# Patient Record
Sex: Female | Born: 1950 | Race: White | Hispanic: No | Smoking: Former smoker
Health system: Southern US, Community
[De-identification: ages and names within clinical notes are randomized; demographics above are authoritative.]

## PROBLEM LIST (undated history)

## (undated) DIAGNOSIS — N281 Cyst of kidney, acquired: Secondary | ICD-10-CM

## (undated) DIAGNOSIS — M1990 Unspecified osteoarthritis, unspecified site: Secondary | ICD-10-CM

## (undated) DIAGNOSIS — F419 Anxiety disorder, unspecified: Secondary | ICD-10-CM

## (undated) DIAGNOSIS — R001 Bradycardia, unspecified: Secondary | ICD-10-CM

## (undated) DIAGNOSIS — N390 Urinary tract infection, site not specified: Secondary | ICD-10-CM

## (undated) DIAGNOSIS — F329 Major depressive disorder, single episode, unspecified: Secondary | ICD-10-CM

## (undated) DIAGNOSIS — F32A Depression, unspecified: Secondary | ICD-10-CM

## (undated) DIAGNOSIS — C4490 Unspecified malignant neoplasm of skin, unspecified: Secondary | ICD-10-CM

## (undated) DIAGNOSIS — I10 Essential (primary) hypertension: Secondary | ICD-10-CM

## (undated) DIAGNOSIS — I440 Atrioventricular block, first degree: Secondary | ICD-10-CM

## (undated) DIAGNOSIS — J45909 Unspecified asthma, uncomplicated: Secondary | ICD-10-CM

## (undated) DIAGNOSIS — I7090 Unspecified atherosclerosis: Secondary | ICD-10-CM

## (undated) DIAGNOSIS — K760 Fatty (change of) liver, not elsewhere classified: Secondary | ICD-10-CM

## (undated) DIAGNOSIS — C801 Malignant (primary) neoplasm, unspecified: Secondary | ICD-10-CM

## (undated) DIAGNOSIS — N1330 Unspecified hydronephrosis: Secondary | ICD-10-CM

## (undated) DIAGNOSIS — E785 Hyperlipidemia, unspecified: Secondary | ICD-10-CM

## (undated) DIAGNOSIS — I480 Paroxysmal atrial fibrillation: Secondary | ICD-10-CM

## (undated) DIAGNOSIS — M549 Dorsalgia, unspecified: Secondary | ICD-10-CM

## (undated) DIAGNOSIS — K5790 Diverticulosis of intestine, part unspecified, without perforation or abscess without bleeding: Secondary | ICD-10-CM

## (undated) DIAGNOSIS — Z87442 Personal history of urinary calculi: Secondary | ICD-10-CM

## (undated) DIAGNOSIS — I701 Atherosclerosis of renal artery: Secondary | ICD-10-CM

## (undated) DIAGNOSIS — I4891 Unspecified atrial fibrillation: Secondary | ICD-10-CM

## (undated) DIAGNOSIS — R0600 Dyspnea, unspecified: Secondary | ICD-10-CM

## (undated) HISTORY — PX: TONSILLECTOMY: SUR1361

## (undated) HISTORY — DX: Atrioventricular block, first degree: I44.0

## (undated) HISTORY — DX: Unspecified atrial fibrillation: I48.91

## (undated) HISTORY — PX: OTHER SURGICAL HISTORY: SHX169

## (undated) HISTORY — PX: COLONOSCOPY: SHX174

## (undated) HISTORY — DX: Essential (primary) hypertension: I10

## (undated) HISTORY — DX: Dorsalgia, unspecified: M54.9

## (undated) HISTORY — DX: Hyperlipidemia, unspecified: E78.5

## (undated) HISTORY — PX: COLON SURGERY: SHX602

## (undated) HISTORY — DX: Atherosclerosis of renal artery: I70.1

## (undated) HISTORY — DX: Unspecified malignant neoplasm of skin, unspecified: C44.90

## (undated) HISTORY — DX: Paroxysmal atrial fibrillation: I48.0

## (undated) HISTORY — DX: Bradycardia, unspecified: R00.1

---

## 1998-03-28 ENCOUNTER — Other Ambulatory Visit: Admission: RE | Admit: 1998-03-28 | Discharge: 1998-03-28 | Payer: Self-pay | Admitting: Obstetrics and Gynecology

## 1999-03-30 ENCOUNTER — Emergency Department (HOSPITAL_COMMUNITY): Admission: EM | Admit: 1999-03-30 | Discharge: 1999-03-30 | Payer: Self-pay | Admitting: Emergency Medicine

## 1999-03-31 ENCOUNTER — Inpatient Hospital Stay (HOSPITAL_COMMUNITY): Admission: EM | Admit: 1999-03-31 | Discharge: 1999-04-10 | Payer: Self-pay | Admitting: Emergency Medicine

## 1999-03-31 ENCOUNTER — Encounter: Payer: Self-pay | Admitting: Emergency Medicine

## 1999-03-31 ENCOUNTER — Ambulatory Visit (HOSPITAL_COMMUNITY): Admission: RE | Admit: 1999-03-31 | Discharge: 1999-03-31 | Payer: Self-pay | Admitting: Emergency Medicine

## 1999-04-15 ENCOUNTER — Inpatient Hospital Stay (HOSPITAL_COMMUNITY): Admission: EM | Admit: 1999-04-15 | Discharge: 1999-04-19 | Payer: Self-pay | Admitting: Emergency Medicine

## 1999-04-23 ENCOUNTER — Inpatient Hospital Stay (HOSPITAL_COMMUNITY): Admission: EM | Admit: 1999-04-23 | Discharge: 1999-04-30 | Payer: Self-pay | Admitting: Emergency Medicine

## 1999-07-23 ENCOUNTER — Inpatient Hospital Stay (HOSPITAL_COMMUNITY): Admission: RE | Admit: 1999-07-23 | Discharge: 1999-07-30 | Payer: Self-pay | Admitting: General Surgery

## 1999-07-24 ENCOUNTER — Encounter: Payer: Self-pay | Admitting: General Surgery

## 2000-01-20 ENCOUNTER — Other Ambulatory Visit: Admission: RE | Admit: 2000-01-20 | Discharge: 2000-01-20 | Payer: Self-pay | Admitting: Obstetrics and Gynecology

## 2001-03-11 ENCOUNTER — Encounter: Admission: RE | Admit: 2001-03-11 | Discharge: 2001-04-14 | Payer: Self-pay | Admitting: Podiatry

## 2002-02-21 ENCOUNTER — Other Ambulatory Visit: Admission: RE | Admit: 2002-02-21 | Discharge: 2002-02-21 | Payer: Self-pay | Admitting: Obstetrics and Gynecology

## 2002-12-18 ENCOUNTER — Ambulatory Visit (HOSPITAL_COMMUNITY): Admission: RE | Admit: 2002-12-18 | Discharge: 2002-12-18 | Payer: Self-pay | Admitting: Gastroenterology

## 2003-08-07 ENCOUNTER — Other Ambulatory Visit: Admission: RE | Admit: 2003-08-07 | Discharge: 2003-08-07 | Payer: Self-pay | Admitting: Obstetrics and Gynecology

## 2009-12-30 HISTORY — PX: US ECHOCARDIOGRAPHY: HXRAD669

## 2011-01-06 ENCOUNTER — Ambulatory Visit: Payer: Self-pay | Admitting: Cardiovascular Disease

## 2011-04-29 ENCOUNTER — Other Ambulatory Visit: Payer: Self-pay | Admitting: Cardiovascular Disease

## 2011-04-29 NOTE — Telephone Encounter (Signed)
Fax received from pharmacy. Refill completed. Jodette Ida Uppal RN  

## 2011-06-01 ENCOUNTER — Other Ambulatory Visit: Payer: Self-pay | Admitting: Obstetrics & Gynecology

## 2011-06-23 ENCOUNTER — Encounter: Payer: Self-pay | Admitting: Cardiovascular Disease

## 2011-06-24 ENCOUNTER — Encounter: Payer: Self-pay | Admitting: Cardiovascular Disease

## 2011-06-29 ENCOUNTER — Ambulatory Visit (INDEPENDENT_AMBULATORY_CARE_PROVIDER_SITE_OTHER): Payer: PRIVATE HEALTH INSURANCE | Admitting: Cardiovascular Disease

## 2011-06-29 ENCOUNTER — Encounter: Payer: Self-pay | Admitting: Cardiovascular Disease

## 2011-06-29 VITALS — BP 120/74 | HR 68 | Ht 65.0 in | Wt 220.2 lb

## 2011-06-29 DIAGNOSIS — I4891 Unspecified atrial fibrillation: Secondary | ICD-10-CM

## 2011-06-29 DIAGNOSIS — I480 Paroxysmal atrial fibrillation: Secondary | ICD-10-CM | POA: Insufficient documentation

## 2011-06-29 MED ORDER — METOPROLOL SUCCINATE ER 100 MG PO TB24: 150.0000 mg | ORAL_TABLET | Freq: Every day | ORAL | Status: DC

## 2011-06-29 NOTE — Assessment & Plan Note (Addendum)
We had a long discussion about management of atrial fibrillation. We talked about very low risk medications that also had relatively little efficacy. We talked about very high risk medicines that were much more efficacious such as Tikosyn. We've discussed the fact that she may be a candidate for flecainide but she really does not want to have a stress test.  She has been taking the propranolol on an as-needed basis and notes that her a fibrillation is a lot better tolerated when her heart rate is slow. We will increase her Toprol-XL to 150 mg a day and see if that helps. I given her permission to take that when necessary propranolol as well.  I've asked her to try to stick to a good diet and exercise program. I did emphasize the fact that if she were to lose weight, that her age her fibrillation may improve and that she would probably tolerate much better.  Posterior back in several months.

## 2011-06-29 NOTE — Progress Notes (Signed)
Peggy Chen Date of Birth  Jan 10, 1951 [facility] [address]     [address] [place], [place]  [address] [phone]  Fax  [phone]  History of Present Illness:  Peggy Chen is a 60 year old female with a history of intermittent atrial fibrillation. She also has a history of diabetes mellitus, hyperlipidemia, and hypertension.  She has atrial fibrillation that occurs approximately 1 week. These episodes typically occur on Tuesday according to her. She does not drink any excessive amount of alcohol. She thinks that there may be a trigger for her atrial fibrillation but she cannot determine what that is. These episodes they took fibrillation causes lots of shortness of breath and general fatigue. She did tolerates them very poorly.  [name] going to [facility] for second opinion. They have mentioned starting her on flexion. She does not really want to go to the stress test.  Current Outpatient Prescriptions on File Prior to Visit  Medication Sig Dispense Refill  . aspirin 81 MG tablet Take 81 mg by mouth daily.        . folic acid (FOLVITE) 1 MG tablet Take 1 mg by mouth daily.        . metoprolol (TOPROL-XL) 100 MG 24 hr tablet TAKE 1 TABLET BY MOUTH EVERY DAY  30 tablet  12  . Olmesartan-Amlodipine-HCTZ (TRIBENZOR) 40-5-25 MG TABS Take by mouth daily.        Marland Kitchen omega-3 acid ethyl esters (LOVAZA) 1 G capsule Take 1 g by mouth 2 (two) times daily.        . propranolol (INDERAL) 10 MG tablet Take 10 mg by mouth as needed.        . warfarin (COUMADIN) 5 MG tablet Take 5 mg by mouth as directed.        . zolpidem (AMBIEN) 5 MG tablet Take 2.5 mg by mouth at bedtime.          Allergies  Allergen Reactions  . Penicillins     Past Medical History  Diagnosis Date  . Diabetes mellitus   . Hyperlipidemia   . Hypertension   . Back pain   . AF (atrial fibrillation)     Past Surgical History  Procedure Date  . Tonsillectomy   . Breast cyst  removal     RIGHT BREAST  . Colonoscopy   . US echocardiography 12/30/2009    EF 55-60%    History  Smoking status  . Former Smoker  . Quit date: 06/22/1986  Smokeless tobacco  . Not on file    History  Alcohol Use No    Family History  Problem Relation Age of Onset  . COPD Mother   . Heart attack Father     Reviw of Systems:  Reviewed in the HPI.  All other systems are negative.  Physical Exam: BP 120/74  Pulse 68  Ht 5\' 5"  (1.651 m)  Wt 220 lb 3.2 oz (99.882 kg)  BMI 36.64 kg/m2 The patient is alert and oriented x 3.  The mood and affect are normal.   Skin: warm and dry.  Color is normal.    HEENT:   the sclera are nonicteric.  The mucous membranes are moist.  The carotids are 2+ without bruits.  There is no thyromegaly.  There is no JVD.    Lungs: clear.  The chest wall is non tender.    Heart: regular rate with a normal S1 and S2.  There are no murmurs, gallops, or  rubs. The PMI is not displaced.     Abdomen: good bowel sounds.  There is no guarding or rebound.  There is no hepatosplenomegaly or tenderness.  There are no masses.   Extremities:  no clubbing, cyanosis, or edema.  The legs are without rashes.  The distal pulses are intact.   Neuro:  Cranial nerves II - XII are intact.  Motor and sensory functions are intact.    The gait is normal.  Assessment / Plan:

## 2011-08-13 ENCOUNTER — Other Ambulatory Visit: Payer: Self-pay | Admitting: *Deleted

## 2011-08-13 MED ORDER — PROPRANOLOL HCL 10 MG PO TABS: 10.0000 mg | ORAL_TABLET | ORAL | Status: DC | PRN

## 2011-08-13 NOTE — Telephone Encounter (Signed)
Fax received from pharmacy. Refill completed. Jodette Quatisha Zylka RN  

## 2011-09-28 ENCOUNTER — Ambulatory Visit (INDEPENDENT_AMBULATORY_CARE_PROVIDER_SITE_OTHER): Payer: PRIVATE HEALTH INSURANCE | Admitting: Cardiovascular Disease

## 2011-09-28 ENCOUNTER — Encounter: Payer: Self-pay | Admitting: Cardiovascular Disease

## 2011-09-28 DIAGNOSIS — I4891 Unspecified atrial fibrillation: Secondary | ICD-10-CM

## 2011-09-28 DIAGNOSIS — I152 Hypertension secondary to endocrine disorders: Secondary | ICD-10-CM | POA: Insufficient documentation

## 2011-09-28 DIAGNOSIS — I10 Essential (primary) hypertension: Secondary | ICD-10-CM

## 2011-09-28 MED ORDER — METOPROLOL SUCCINATE ER 200 MG PO TB24: 200.0000 mg | ORAL_TABLET | Freq: Every day | ORAL | Status: DC

## 2011-09-28 NOTE — Progress Notes (Signed)
Peggy Chen Date of Birth  1951-06-01  [facility] [address]    [address] [place], [place]  [address] [phone]  Fax  [phone]  History of Present Illness:  60 year old female with a history of intermittent atrial fibrillation. She also has a history of diabetes mellitus, hyperlipidemia, and hypertension.  [name] been seen at [facility] for second opinion. She is to follow up with [place].  She has a negative stress Myoview study from 2007 in our  office.  [employer] also did a stress test which apparently was also negative.    We talked about starting her on Flecainide or perhaps Propafanone. We have tried to avoid  amiodarone and Tikosyn at this point.  She remains on Coumadin.  Her INR levels are monitored by Dr. Felipa Eth.  She developed recurrent atrial fibrillation today. She notes that her heart rate goes up fairly quickly whenever she walks around. Her episodes typically last for a couple days. [name] on metoprolol and also has taken a couple of extra propranolol tablets. At this point the propranolol tablets don't seem to help decrease her weight much.  SHe typically takes a couple of nights rest and to get these to resolve.  Current Outpatient Prescriptions on File Prior to Visit  Medication Sig Dispense Refill  . aspirin 81 MG tablet Take 81 mg by mouth daily.        . folic acid (FOLVITE) 1 MG tablet Take 1 mg by mouth daily.        . metoprolol (TOPROL-XL) 100 MG 24 hr tablet Take 1.5 tablets (150 mg total) by mouth daily.  45 tablet  12  . Olmesartan-Amlodipine-HCTZ (TRIBENZOR) 40-5-25 MG TABS Take by mouth daily.        Marland Kitchen omega-3 acid ethyl esters (LOVAZA) 1 G capsule Take 1 g by mouth 2 (two) times daily.        . propranolol (INDERAL) 10 MG tablet Take 1 tablet (10 mg total) by mouth as needed.  30 tablet  1  . Saxagliptin-Metformin (KOMBIGLYZE XR) 2.04-999 MG TB24 Take by mouth 2 (two) times daily.        Marland Kitchen warfarin (COUMADIN) 5 MG tablet Take 5 mg by  mouth as directed.        . zolpidem (AMBIEN) 5 MG tablet Take 2.5 mg by mouth at bedtime.          Allergies  Allergen Reactions  . Penicillins     Past Medical History  Diagnosis Date  . Diabetes mellitus   . Hyperlipidemia   . Hypertension   . Back pain   . AF (atrial fibrillation)     Past Surgical History  Procedure Date  . Tonsillectomy   . Breast cyst removal     RIGHT BREAST  . Colonoscopy   . US echocardiography 12/30/2009    EF 55-60%    History  Smoking status  . Former Smoker  . Quit date: 06/22/1986  Smokeless tobacco  . Not on file    History  Alcohol Use No    Family History  Problem Relation Age of Onset  . COPD Mother   . Heart attack Father     Reviw of Systems:  Reviewed in the HPI.  All other systems are negative.  Physical Exam: BP 132/80  Pulse 80  Ht 5\' 5"  (1.651 m)  Wt 223 lb 12.8 oz (101.515 kg)  BMI 37.24 kg/m2 The patient is alert and oriented x 3.  The mood and affect  are normal.   Skin: warm and dry.  Color is normal.    HEENT:   Normal carotids, no JVD  Lungs: clear   Heart: Irregularly Irregular    Abdomen: benign  Extremities:  No edema.  Neuro:  Nonfocal. Her gait is normal.     ECG: Atrial fibrillation with a VAC rapid ventricular response. She has nonspecific ST and T-wave changes. She has low voltage QRS.   Assessment / Plan:

## 2011-09-28 NOTE — Assessment & Plan Note (Addendum)
Blood pressures remained stable. We'll continue to follow this.

## 2011-09-28 NOTE — Patient Instructions (Addendum)
Your physician wants you to follow-up in: 3 months/ ekg,  You will receive a reminder letter in the mail two months in advance. If you don't receive a letter, please call our office to schedule the follow-up appointment.  Your physician has recommended you make the following change in your medication:   1) increase Toprol xl to 200 mg a day, called into [facility].

## 2011-09-28 NOTE — Assessment & Plan Note (Addendum)
We talked extensively about the different treatments for atrial fibrillation. We discussed the possibility of the Propafanone and possibly Flecainide. She would like to continue with the beta blocker approach. We will increase her metoprolol to 200 mg a day. She'll continue to take propranolol on an as-needed basis.

## 2011-10-26 ENCOUNTER — Other Ambulatory Visit: Payer: Self-pay | Admitting: Cardiovascular Disease

## 2012-03-24 ENCOUNTER — Other Ambulatory Visit: Payer: Self-pay | Admitting: *Deleted

## 2012-03-24 DIAGNOSIS — I4891 Unspecified atrial fibrillation: Secondary | ICD-10-CM

## 2012-03-24 MED ORDER — METOPROLOL SUCCINATE ER 200 MG PO TB24: 200.0000 mg | ORAL_TABLET | Freq: Every day | ORAL | Status: DC

## 2012-03-30 ENCOUNTER — Other Ambulatory Visit: Payer: Self-pay | Admitting: *Deleted

## 2012-03-30 DIAGNOSIS — I4891 Unspecified atrial fibrillation: Secondary | ICD-10-CM

## 2012-03-30 MED ORDER — METOPROLOL SUCCINATE ER 200 MG PO TB24: 200.0000 mg | ORAL_TABLET | Freq: Every day | ORAL | Status: DC

## 2012-03-30 NOTE — Telephone Encounter (Signed)
Fax Received. Refill Completed. Peggy Chen (R.M.A)   

## 2012-03-30 NOTE — Telephone Encounter (Signed)
Opened in Error.

## 2012-04-04 ENCOUNTER — Other Ambulatory Visit: Payer: Self-pay | Admitting: *Deleted

## 2012-04-04 NOTE — Telephone Encounter (Signed)
Opened in Error.

## 2012-09-21 ENCOUNTER — Other Ambulatory Visit: Payer: Self-pay | Admitting: *Deleted

## 2012-09-21 DIAGNOSIS — I4891 Unspecified atrial fibrillation: Secondary | ICD-10-CM

## 2012-09-21 MED ORDER — METOPROLOL SUCCINATE ER 200 MG PO TB24: 200.0000 mg | ORAL_TABLET | Freq: Every day | ORAL | Status: DC

## 2012-09-21 NOTE — Telephone Encounter (Signed)
Pt needs appointment then refill can be made Fax Received. Refill Completed. Peggy Chen (R.M.A)   

## 2012-09-22 ENCOUNTER — Other Ambulatory Visit: Payer: Self-pay | Admitting: *Deleted

## 2012-09-22 NOTE — Telephone Encounter (Signed)
Opened in Error.

## 2012-09-23 ENCOUNTER — Telehealth: Payer: Self-pay | Admitting: Cardiovascular Disease

## 2012-09-27 ENCOUNTER — Other Ambulatory Visit: Payer: Self-pay | Admitting: *Deleted

## 2012-09-27 DIAGNOSIS — I4891 Unspecified atrial fibrillation: Secondary | ICD-10-CM

## 2012-09-27 MED ORDER — METOPROLOL SUCCINATE ER 200 MG PO TB24: 200.0000 mg | ORAL_TABLET | Freq: Every day | ORAL | Status: DC

## 2012-10-10 ENCOUNTER — Other Ambulatory Visit: Payer: Self-pay | Admitting: Cardiovascular Disease

## 2012-10-10 NOTE — Telephone Encounter (Signed)
Pt needs appointment then refill can be made Fax Received. Refill Completed. Gee Habig Chowoe (R.M.A)   

## 2012-10-17 ENCOUNTER — Ambulatory Visit: Payer: PRIVATE HEALTH INSURANCE | Admitting: Cardiovascular Disease

## 2012-10-21 ENCOUNTER — Encounter: Payer: Self-pay | Admitting: Cardiovascular Disease

## 2012-10-24 ENCOUNTER — Encounter: Payer: Self-pay | Admitting: Cardiology

## 2012-11-14 ENCOUNTER — Encounter: Payer: Self-pay | Admitting: Cardiovascular Disease

## 2012-11-14 ENCOUNTER — Ambulatory Visit (INDEPENDENT_AMBULATORY_CARE_PROVIDER_SITE_OTHER): Payer: BC Managed Care – PPO | Admitting: Cardiovascular Disease

## 2012-11-14 VITALS — BP 110/70 | HR 68 | Ht 65.0 in | Wt 224.0 lb

## 2012-11-14 DIAGNOSIS — I4891 Unspecified atrial fibrillation: Secondary | ICD-10-CM

## 2012-11-14 DIAGNOSIS — I10 Essential (primary) hypertension: Secondary | ICD-10-CM

## 2012-11-14 NOTE — Patient Instructions (Addendum)
Your physician wants you to follow-up in: 1 year  You will receive a reminder letter in the mail two months in advance. If you don't receive a letter, please call our office to schedule the follow-up appointment.  Your physician recommends that you continue on your current medications as directed. Please refer to the Current Medication list given to you today.  

## 2012-11-14 NOTE — Progress Notes (Signed)
Peggy Chen Date of Birth  [birth year]       [facility] Office [address]  [address] [place], [place]  [address]   [place], [place]  [address] [phone]     [phone]   Fax  [phone]    Fax [phone]  Problem List: 1. Intermittent atrial fibrillation 2. Diabetes mellitus 3. Hyperlipidemia 4. Hypertension  History of Present Illness:  61 year old female with a history of intermittent atrial fibrillation. She also has a history of diabetes mellitus, hyperlipidemia, and hypertension. She still occasionally has episodes of A-Fib that last 1-3 days.  They can be as short as 3 hours.  She takes propranolol which seems to help.  She is on coumadin.   She does not exercise much at all.  She actually feels better when she is active.    Current Outpatient Prescriptions on File Prior to Visit  Medication Sig Dispense Refill  . aspirin 81 MG tablet Take 81 mg by mouth daily.        . folic acid (FOLVITE) 1 MG tablet Take 1 mg by mouth daily.        . metoprolol (TOPROL-XL) 200 MG 24 hr tablet Take 1 tablet (200 mg total) by mouth daily.  15 tablet  0  . Olmesartan-Amlodipine-HCTZ (TRIBENZOR) 40-5-25 MG TABS Take by mouth daily.        Marland Kitchen omega-3 acid ethyl esters (LOVAZA) 1 G capsule Take 1 g by mouth 2 (two) times daily.        . propranolol (INDERAL) 10 MG tablet TAKE 1 TABLET BY MOUTH EVERY DAY AS NEEDED  30 tablet  0  . Saxagliptin-Metformin (KOMBIGLYZE XR) 2.04-999 MG TB24 Take by mouth 2 (two) times daily.        Marland Kitchen warfarin (COUMADIN) 5 MG tablet Take 5 mg by mouth as directed.        . zolpidem (AMBIEN) 5 MG tablet Take 2.5 mg by mouth at bedtime.          Allergies  Allergen Reactions  . Penicillins     Past Medical History  Diagnosis Date  . Diabetes mellitus   . Hyperlipidemia   . Hypertension   . Back pain   . AF (atrial fibrillation)     Past Surgical History  Procedure Date  . Tonsillectomy     . Breast cyst removal     RIGHT BREAST  . Colonoscopy   . US echocardiography 12/30/2009    EF 55-60%    History  Smoking status  . Former Smoker  . Quit date: 06/22/1986  Smokeless tobacco  . Not on file    History  Alcohol Use No    Family History  Problem Relation Age of Onset  . COPD Mother   . Heart attack Father     Reviw of Systems:  Reviewed in the HPI.  All other systems are negative.  Physical Exam: Blood pressure 110/70, pulse 68, height 5\' 5"  (1.651 m), weight 224 lb (101.606 kg). General: Well developed, well nourished, in no acute distress.  Head: Normocephalic, atraumatic, sclera non-icteric, mucus membranes are moist,   Neck: Supple. Carotids are 2 + without bruits. No JVD , thick neck  Lungs: Clear   Heart: RR  Abdomen: Soft, non-tender, non-distended with normal bowel sounds.  Msk:  Strength and tone are normal   Extremities: No clubbing or cyanosis. No edema.  Distal pedal pulses are 2+ and  equal    Neuro: CN II - XII intact.  Alert and oriented X 3.   Psych:  Normal   ECG: 11/14/2012-normal sinus rhythm at 60. Is low voltage.  Assessment / Plan:

## 2012-11-14 NOTE — Assessment & Plan Note (Addendum)
Peggy Chen seems to be doing well. She continues to have intermittent episodes of atrial fibrillation. She takes propranolol on an as-needed basis. We'll continue with her current medications . I'll see her again in one year. She did not want to try any new antiarrhythmics at this time.

## 2012-12-14 ENCOUNTER — Other Ambulatory Visit: Payer: Self-pay | Admitting: *Deleted

## 2012-12-14 MED ORDER — PROPRANOLOL HCL 10 MG PO TABS: 10.0000 mg | ORAL_TABLET | Freq: Every day | ORAL | Status: DC | PRN

## 2012-12-14 NOTE — Telephone Encounter (Signed)
Fax Received. Refill Completed. Peggy Chen (R.M.A)   

## 2013-05-25 ENCOUNTER — Other Ambulatory Visit: Payer: Self-pay | Admitting: *Deleted

## 2013-05-25 DIAGNOSIS — I4891 Unspecified atrial fibrillation: Secondary | ICD-10-CM

## 2013-05-25 MED ORDER — METOPROLOL SUCCINATE ER 200 MG PO TB24: 200.0000 mg | ORAL_TABLET | Freq: Every day | ORAL | Status: DC

## 2013-05-25 NOTE — Telephone Encounter (Signed)
Fax Received. Refill Completed. Quetzalli Clos Chowoe (R.M.A)   

## 2013-12-13 ENCOUNTER — Other Ambulatory Visit: Payer: Self-pay | Admitting: Cardiovascular Disease

## 2014-02-12 ENCOUNTER — Other Ambulatory Visit: Payer: Self-pay | Admitting: Cardiovascular Disease

## 2014-02-22 ENCOUNTER — Ambulatory Visit: Payer: BC Managed Care – PPO | Admitting: Cardiovascular Disease

## 2014-02-26 ENCOUNTER — Encounter: Payer: Self-pay | Admitting: Cardiovascular Disease

## 2014-02-26 ENCOUNTER — Ambulatory Visit (INDEPENDENT_AMBULATORY_CARE_PROVIDER_SITE_OTHER): Payer: PRIVATE HEALTH INSURANCE | Admitting: Cardiovascular Disease

## 2014-02-26 VITALS — BP 94/70 | HR 97 | Ht 65.0 in | Wt 226.1 lb

## 2014-02-26 DIAGNOSIS — I4891 Unspecified atrial fibrillation: Secondary | ICD-10-CM

## 2014-02-26 MED ORDER — FLECAINIDE ACETATE 50 MG PO TABS: 50.0000 mg | ORAL_TABLET | Freq: Two times a day (BID) | ORAL | Status: DC

## 2014-02-26 NOTE — Assessment & Plan Note (Signed)
[name] gone back into atrial fibrillation and now spends a fair amount of time in atrial fibrillation. We'll start her on flecainide 50 mg twice a day. I though I will have her return to see Cecille Rubin  in approximately 2 weeks to see if she's converted. If she is still in atrial fibrillation at that time, we will increase her flecainide to 100 mg twice a day.  Once we've seen that she's converted and maintained sinus rhythm, we'll schedule her for a stress Myoview study to rule out ischemia and to evaluate her for possible pro-arrhythmia. I would like the stress test performed on  flecainide and metoprolol. If she is not able to give her heart rate up to target, we can use Lexiscan.    I'll see her in 3 months.

## 2014-02-26 NOTE — Progress Notes (Signed)
Peggy Chen Date of Birth  10/18/1951       [facility] Office [address] [place], [place], [place]  [address]   [place], [place]  [address] [phone]     [phone]   Fax  [phone]    Fax [phone]  Problem List: 1. Intermittent atrial fibrillation 2. Diabetes mellitus 3. Hyperlipidemia 4. Hypertension  History of Present Illness:  63 year old female with a history of intermittent atrial fibrillation. She also has a history of diabetes mellitus, hyperlipidemia, and hypertension. She still occasionally has episodes of A-Fib that last 1-3 days.  They can be as short as 3 hours.  She takes propranolol which seems to help.  She is on coumadin.   She does not exercise much at all.  She actually feels better when she is active.    February 26, 2014:  Still having some atrial fib.  On coumadin.    Current Outpatient Prescriptions on File Prior to Visit  Medication Sig Dispense Refill  . aspirin 81 MG tablet Take 81 mg by mouth daily.        . folic acid (FOLVITE) 1 MG tablet Take 1 mg by mouth daily.        . metoprolol (TOPROL-XL) 200 MG 24 hr tablet TAKE 1 TABLET BY MOUTH EVERY DAY  90 tablet  0  . Olmesartan-Amlodipine-HCTZ (TRIBENZOR) 40-5-25 MG TABS Take by mouth daily.        . propranolol (INDERAL) 10 MG tablet TAKE 1 TABLET (10 MG TOTAL) BY MOUTH DAILY AS NEEDED.  30 tablet  0  . Saxagliptin-Metformin (KOMBIGLYZE XR) 2.04-999 MG TB24 Take by mouth 2 (two) times daily.        Marland Kitchen warfarin (COUMADIN) 5 MG tablet Take 5 mg by mouth as directed.        . zolpidem (AMBIEN) 5 MG tablet Take 2.5 mg by mouth at bedtime.         No current facility-administered medications on file prior to visit.    Allergies  Allergen Reactions  . Penicillins     Past Medical History  Diagnosis Date  . Diabetes mellitus   . Hyperlipidemia   . Hypertension   . Back pain   . AF (atrial fibrillation)     Past Surgical  History  Procedure Laterality Date  . Tonsillectomy    . Breast cyst removal      RIGHT BREAST  . Colonoscopy    . US echocardiography  12/30/2009    EF 55-60%    History  Smoking status  . Former Smoker  . Quit date: 06/22/1986  Smokeless tobacco  . Not on file    History  Alcohol Use No    Family History  Problem Relation Age of Onset  . COPD Mother   . Heart attack Father     Reviw of Systems:  Reviewed in the HPI.  All other systems are negative.  Physical Exam: Blood pressure 94/70, pulse 97, height 5\' 5"  (1.651 m), weight 226 lb 1.9 oz (102.567 kg). General: Well developed, well nourished, in no acute distress.  Head: Normocephalic, atraumatic, sclera non-icteric, mucus membranes are moist,   Neck: Supple. Carotids are 2 + without bruits. No JVD , thick neck  Lungs: Clear   Heart: Irregularly irregular, her heart rate is tachycardic.  Abdomen: Soft, non-tender, non-distended with normal bowel sounds.  Msk:  Strength and tone are normal   Extremities:  No clubbing or cyanosis. No edema.  Distal pedal pulses are 2+ and equal    Neuro: CN II - XII intact.  Alert and oriented X 3.   Psych:  Normal   ECG: 02/26/2014: Atrial fibrillation with a ventricular rate of 97. She has nonspecific ST and T wave. .  Assessment / Plan:

## 2014-02-26 NOTE — Patient Instructions (Addendum)
Your physician has recommended you make the following change in your medication:  START FLECAINIDE 50 MG TWICE DAILY 12 HOURS APART  Your physician recommends that you schedule a follow-up appointment in: [place] REVIEW    Your physician recommends that you schedule a follow-up appointment in: 3 MONTHS WITH AN EKG

## 2014-02-27 ENCOUNTER — Telehealth: Payer: Self-pay | Admitting: Cardiovascular Disease

## 2014-02-27 NOTE — Telephone Encounter (Signed)
NOTED

## 2014-02-27 NOTE — Telephone Encounter (Signed)
New message     Metoprolol dosage is 200mg .  Pt want Peggy Chen to know.

## 2014-03-13 ENCOUNTER — Ambulatory Visit: Payer: PRIVATE HEALTH INSURANCE | Admitting: Nurse Practitioner

## 2014-03-26 ENCOUNTER — Ambulatory Visit (INDEPENDENT_AMBULATORY_CARE_PROVIDER_SITE_OTHER): Payer: BC Managed Care – PPO | Admitting: Nurse Practitioner

## 2014-03-26 ENCOUNTER — Encounter: Payer: Self-pay | Admitting: Nurse Practitioner

## 2014-03-26 VITALS — BP 110/70 | HR 89 | Ht 65.0 in | Wt 226.8 lb

## 2014-03-26 DIAGNOSIS — I4891 Unspecified atrial fibrillation: Secondary | ICD-10-CM

## 2014-03-26 MED ORDER — FLECAINIDE ACETATE 50 MG PO TABS: 100.0000 mg | ORAL_TABLET | Freq: Two times a day (BID) | ORAL | Status: DC

## 2014-03-26 NOTE — Progress Notes (Addendum)
Peggy Chen Date of Birth: 10-May-1951 Medical Record #[id number]  History of Present Illness: Peggy Chen is seen back today for a follow up visit. Seen for Dr. Acie Fredrickson. She is a 63 year old female with PAF - now on flecainide. Remains on coumadin - followed by Dr. Dagmar Hait. Other issues include HTN, HLD and DM. No known CAD.  Seen last month by Dr. Acie Fredrickson - she was in atrial fib - he started her on Flecainide - plan was to come back and titrate up if needed - then consider cardioversion - proceed with stress Myoview if she ends up staying on this dose.   Comes back today. Here alone. Still out of rhythm. She is easily fatigued. No chest pain. No passing out spells. Has been on coumadin long term - says her dose has been stable for some time. Last check last week was 2.9. No bleeding/bruising.  Current Outpatient Prescriptions  Medication Sig Dispense Refill  . aspirin 81 MG tablet Take 81 mg by mouth daily.        . citalopram (CELEXA) 10 MG tablet Take 10 mg by mouth daily.      . cyanocobalamin 1000 MCG tablet Take 100 mcg by mouth daily.      . flecainide (TAMBOCOR) 50 MG tablet Take 1 tablet (50 mg total) by mouth 2 (two) times daily.  836 tablet  3  . folic acid (FOLVITE) 1 MG tablet Take 1 mg by mouth daily.        . metoprolol (TOPROL-XL) 200 MG 24 hr tablet TAKE 1 TABLET BY MOUTH EVERY DAY  90 tablet  0  . Olmesartan-Amlodipine-HCTZ (TRIBENZOR) 40-5-25 MG TABS Take by mouth daily.        . propranolol (INDERAL) 10 MG tablet TAKE 1 TABLET (10 MG TOTAL) BY MOUTH DAILY AS NEEDED.  30 tablet  0  . Saxagliptin-Metformin (KOMBIGLYZE XR) 2.04-999 MG TB24 Take by mouth 2 (two) times daily.        Marland Kitchen warfarin (COUMADIN) 5 MG tablet Take 5 mg by mouth as directed.        . zolpidem (AMBIEN) 5 MG tablet Take 2.5 mg by mouth at bedtime.         No current facility-administered medications for this visit.    Allergies  Allergen Reactions  . Penicillins     Past Medical History  Diagnosis  Date  . Diabetes mellitus   . Hyperlipidemia   . Hypertension   . Back pain   . AF (atrial fibrillation)     Past Surgical History  Procedure Laterality Date  . Tonsillectomy    . Breast cyst removal      RIGHT BREAST  . Colonoscopy    . US echocardiography  12/30/2009    EF 55-60%    History  Smoking status  . Former Smoker  . Quit date: 06/22/1986  Smokeless tobacco  . Not on file    History  Alcohol Use No    Family History  Problem Relation Age of Onset  . COPD Mother   . Heart attack Father     Review of Systems: The review of systems is per the HPI.  All other systems were reviewed and are negative.  Physical Exam: BP 110/70  Pulse 89  Ht 5\' 5"  (1.651 m)  Wt 226 lb 12.8 oz (102.876 kg)  BMI 37.74 kg/m2 Patient is very pleasant and in no acute distress. She is obese. Skin is warm and dry. Color is normal.  HEENT is unremarkable. Normocephalic/atraumatic. PERRL. Sclera are nonicteric. Neck is supple. No masses. No JVD. Lungs are clear. Cardiac exam shows an irregular rhythm. Her rate is controlled. Abdomen is soft. Extremities are without edema. Gait and ROM are intact. No gross neurologic deficits noted.   LABORATORY DATA: EKG today is reviewed with Dr. Acie Fredrickson - she is in atrial fib - controlled VR - diffuse ST/T wave changes. Normal QT.   No results found for this basename: WBC, HGB, HCT, PLT, GLUCOSE, CHOL, TRIG, HDL, LDLDIRECT, LDLCALC, ALT, AST, NA, K, CL, CREATININE, BUN, CO2, TSH, PSA, INR, GLUF, HGBA1C, MICROALBUR   No results found for this basename: INR, PROTIME     Assessment / Plan: 1. Atrial fib - rate is controlled - discussed with Dr. Acie Fredrickson - will increase the Flecainide to 100 mg BID. Noted the possible interaction with her Celexa - reviewed with Dr. Acie Fredrickson who feels it is safe to proceed. She has a normal QT and QRS. Recheck EKG on return. If she is in atrial fib on return - will plan for cardioversion - then arrange for stress Myoview if  she remains on long term Flecainide. Update her echo today as well.   2.HTN  3. HLD  4. Chronic coumadin - has been on a stable dose for some time - will call PCP to review past INR's.   Patient is agreeable to this plan and will call if any problems develop in the interim.   Burtis Junes, RN, [facility] [address] [place], [place]  [address] [phone]   Addendum:  [name] received and are as follows:  03/21/14 INR was 2.9 02/07/2014 INR was 2.7 12/26/2013 was 2.6 11/21/2013 was 2.7  Burtis Junes, RN, [facility] [address] [place], [place]  [address] [phone]

## 2014-03-26 NOTE — Patient Instructions (Signed)
We will arrange for an echocardiogram  I will see you in 2 weeks  Increase your Flecainide to 100 mg two times a day  We will call Dr. Dagmar Hait about getting your past protimes.  Call the [place] office at [phone] if you have any questions, problems or concerns.

## 2014-04-05 ENCOUNTER — Ambulatory Visit (HOSPITAL_COMMUNITY): Payer: BC Managed Care – PPO | Attending: Internal Medicine | Admitting: Radiology

## 2014-04-05 DIAGNOSIS — I4891 Unspecified atrial fibrillation: Secondary | ICD-10-CM

## 2014-04-05 NOTE — Progress Notes (Signed)
Echocardiogram performed.  

## 2014-04-09 ENCOUNTER — Encounter: Payer: Self-pay | Admitting: Nurse Practitioner

## 2014-04-09 ENCOUNTER — Ambulatory Visit (INDEPENDENT_AMBULATORY_CARE_PROVIDER_SITE_OTHER): Payer: BC Managed Care – PPO | Admitting: Nurse Practitioner

## 2014-04-09 ENCOUNTER — Ambulatory Visit
Admission: RE | Admit: 2014-04-09 | Discharge: 2014-04-09 | Disposition: A | Discharge status: Home / Self Care | Payer: BC Managed Care – PPO | Source: Ambulatory Visit | Attending: Nurse Practitioner | Admitting: Nurse Practitioner

## 2014-04-09 VITALS — BP 110/70 | HR 84 | Ht 65.0 in | Wt 225.0 lb

## 2014-04-09 DIAGNOSIS — I4891 Unspecified atrial fibrillation: Secondary | ICD-10-CM

## 2014-04-09 DIAGNOSIS — Z01810 Encounter for preprocedural cardiovascular examination: Secondary | ICD-10-CM

## 2014-04-09 LAB — CBC
HCT: 41.7 % (ref 36.0–46.0)
Hemoglobin: 13.8 g/dL (ref 12.0–15.0)
MCHC: 33.1 g/dL (ref 30.0–36.0)
MCV: 90.3 fl (ref 78.0–100.0)
Platelets: 323 10*3/uL (ref 150.0–400.0)
RBC: 4.62 Mil/uL (ref 3.87–5.11)
RDW: 14 % (ref 11.5–14.6)
WBC: 9.1 10*3/uL (ref 4.5–10.5)

## 2014-04-09 LAB — APTT: aPTT: 36.5 s — ABNORMAL HIGH (ref 21.7–28.8)

## 2014-04-09 LAB — BASIC METABOLIC PANEL
BUN: 20 mg/dL (ref 6–23)
CO2: 24 mEq/L (ref 19–32)
Calcium: 9.2 mg/dL (ref 8.4–10.5)
Chloride: 102 mEq/L (ref 96–112)
Creatinine, Ser: 0.9 mg/dL (ref 0.4–1.2)
GFR: 63.65 mL/min (ref >60.00)
Glucose, Bld: 165 mg/dL — ABNORMAL HIGH (ref 70–99)
Potassium: 3.7 mEq/L (ref 3.5–5.1)
Sodium: 137 mEq/L (ref 135–145)

## 2014-04-09 LAB — PROTIME-INR
INR: 2.8 ratio — ABNORMAL HIGH (ref 0.8–1.0)
Prothrombin Time: 29.7 s — ABNORMAL HIGH (ref 9.6–13.1)

## 2014-04-09 NOTE — Patient Instructions (Signed)
Stay on your current medicines  We are checking labs today  Please go to [place] to [place] on the first floor for a chest Xray - you may walk in.   We are going to arrange for a cardioversion with Dr. Acie Fredrickson for next week  You are scheduled for a cardioversion on Tuesday, May 12th at 12:30 with Dr. Acie Fredrickson or associates. Please go to [facility] [place] [facility] at Tuesday, May 12th at 11 am.  Enter through the [place] not have any food or drink after midnight on Monday.  You may take your medicines with a sip of water on the day of your procedure.  You will need someone to drive you home following your procedure.   We will arrange for a stress Myoview 1 to 2 weeks following your cardioversion if it is successful  Call the [place] office at [phone] if you have any questions, problems or concerns.     Electrical Cardioversion Electrical cardioversion is the delivery of a jolt of electricity to change the rhythm of the heart. Sticky patches or metal paddles are placed on the chest to deliver the electricity from a device. This is done to restore a normal rhythm. A rhythm that is too fast or not regular keeps the heart from pumping well. Electrical cardioversion is done in an emergency if:   There is low or no blood pressure as a result of the heart rhythm.   Normal rhythm must be restored as fast as possible to protect the brain and heart from further damage.   It may save a life. Cardioversion may be done for heart rhythms that are not immediately life-threatening, such as atrial fibrillation or flutter, in which:   The heart is beating too fast or is not regular.   Medicine to change the rhythm has not worked.   It is safe to wait in order to allow time for preparation.  Symptoms of the abnormal rhythm are bothersome.  The risk of stroke and other serious complications can be reduced. LET YOUR  CAREGIVER KNOW ABOUT:   All medicines you are taking, including vitamins, herbs, eye drops, creams, and over-the-counter medicines.   Previous problems you or members of your family have had with the use of anesthetics.   Any blood disorders you have.   Previous surgeries you have had.   Medical conditions you have. RISKS AND COMPLICATIONS  Generally, this is a safe procedure. However, as with any procedure, complications can occur. Possible complications include:   Breathing problems related to the anesthetic used.  Cardiac arrest This risk is rare.  A blood clot that breaks free and travels to other parts of your body. This could cause a stroke or other problems. The risk of this is lowered by use of blood thinning medicine (anticoagulant) prior to the procedure. BEFORE THE PROCEDURE   You may have tests to detect blood clots in your heart and evaluate heart function.  You may start taking anticoagulants so your blood does not clot as easily.   Medicines may be given to help stabilize your heart rate and rhythm. PROCEDURE  You will be given medicine through an IV tube to reduce discomfort and make you sleepy (sedative).   An electrical shock will be delivered. AFTER THE PROCEDURE Your heart rhythm will be watched to make sure it does not change.You may be able to go home within a few hours.  Document  Released: 11/13/2002 Document Revised: 09/13/2013 Document Reviewed: 06/07/2013 [facility] Patient Information 2014 [place].

## 2014-04-09 NOTE — Progress Notes (Signed)
Peggy Chen Date of Birth: 1951/12/06 Medical Record #[id number]  History of Present Illness: Peggy Chen is seen back today for a follow up visit. This is a 2 week check. Seen for Dr. Acie Fredrickson. She is a 63 year old female with PAF - now on flecainide. Remains on coumadin - followed by Dr. Dagmar Hait. Other issues include HTN, HLD and DM. No known CAD.   Seen back in March of 2015 by Dr. Acie Fredrickson - she was in atrial fib - he started her on Flecainide - plan was to come back and titrate up if needed - then consider cardioversion - proceed with stress Myoview if she ends up staying on this dose.   I saw her 2 weeks ago - still out of rhythm. Flecainide was increased. We obtained several past INR's - all have been therapeutic. They were as follows:   03/21/14 INR was 2.9  02/07/2014 INR was 2.7  12/26/2013 was 2.6  11/21/2013 was 2.7  Comes back today. Here with her husband. Still out of rhythm. She is easily fatigued and gets short of breath. Still knows she is "out". No chest pain. No passing out spells. Has been on coumadin long term - dose has been stable for some time.  No bleeding/bruising. Anxious to proceed with the next step. Echo was updated and was ok - just mild LAE.    Current Outpatient Prescriptions  Medication Sig Dispense Refill  . aspirin 81 MG tablet Take 81 mg by mouth daily.        . citalopram (CELEXA) 10 MG tablet Take 10 mg by mouth daily.      . cyanocobalamin 1000 MCG tablet Take 100 mcg by mouth daily.      . flecainide (TAMBOCOR) 50 MG tablet Take 2 tablets (100 mg total) by mouth 2 (two) times daily.  629 tablet  3  . folic acid (FOLVITE) 1 MG tablet Take 1 mg by mouth daily.        . metoprolol (TOPROL-XL) 200 MG 24 hr tablet TAKE 1 TABLET BY MOUTH EVERY DAY  90 tablet  0  . Olmesartan-Amlodipine-HCTZ (TRIBENZOR) 40-5-25 MG TABS Take by mouth daily.        . propranolol (INDERAL) 10 MG tablet TAKE 1 TABLET (10 MG TOTAL) BY MOUTH DAILY AS NEEDED.  30 tablet  0  .  Saxagliptin-Metformin (KOMBIGLYZE XR) 2.04-999 MG TB24 Take by mouth 2 (two) times daily.        Marland Kitchen warfarin (COUMADIN) 5 MG tablet Take 5 mg by mouth as directed.        . zolpidem (AMBIEN) 5 MG tablet Take 2.5 mg by mouth at bedtime.         No current facility-administered medications for this visit.    Allergies  Allergen Reactions  . Penicillins     Past Medical History  Diagnosis Date  . Diabetes mellitus   . Hyperlipidemia   . Hypertension   . Back pain   . AF (atrial fibrillation)     Past Surgical History  Procedure Laterality Date  . Tonsillectomy    . Breast cyst removal      RIGHT BREAST  . Colonoscopy    . US echocardiography  12/30/2009    EF 55-60%    History  Smoking status  . Former Smoker  . Quit date: 06/22/1986  Smokeless tobacco  . Not on file    History  Alcohol Use No    Family History  Problem Relation Age  of Onset  . COPD Mother   . Heart attack Father     Review of Systems: The review of systems is per the HPI.  All other systems were reviewed and are negative.  Physical Exam: BP 110/70  Pulse 84  Ht 5\' 5"  (1.651 m)  Wt 225 lb (102.059 kg)  BMI 37.44 kg/m2 Patient is very pleasant and in no acute distress. Skin is warm and dry. Color is normal.  HEENT is unremarkable. Normocephalic/atraumatic. PERRL. Sclera are nonicteric. Neck is supple. No masses. No JVD. Lungs are clear. Cardiac exam shows a regular rate and rhythm. Abdomen is soft. Extremities are without edema. Gait and ROM are intact. No gross neurologic deficits noted.  LABORATORY DATA: No results found for this basename: WBC, HGB, HCT, PLT, GLUCOSE, CHOL, TRIG, HDL, LDLDIRECT, LDLCALC, ALT, AST, NA, K, CL, CREATININE, BUN, CO2, TSH, PSA, INR, GLUF, HGBA1C, MICROALBUR   EKG today shows atrial fib with a controlled VR of 84.   Echo Study Conclusions  - Left ventricle: The cavity size was normal. Wall thickness was normal. Systolic function was normal. The  estimated ejection fraction was in the range of 50% to 55%. Wall motion was normal; there were no regional wall motion abnormalities. - Left atrium: The atrium was mildly dilated. - Atrial septum: No defect or patent foramen ovale was identified.   Assessment / Plan: 1. Atrial fib - rate is controlled - on Flecainide 100 mg BID. Will plan for cardioversion - then arrange for stress Myoview if she remains on long term Flecainide.   2.HTN - blood pressure looks good on current regimen.   3. HLD   4. Chronic coumadin - has been on a stable dose for some time - rechecking today. Prior INRs as noted above.  Check labs today, send for CXR and cardioversion is arranged with Dr. Acie Fredrickson for next week.   Patient is agreeable to this plan and will call if any problems develop in the interim.   Burtis Junes, RN, [place]  [address] [place]  [place], [place] [address]  [phone]

## 2014-04-13 ENCOUNTER — Other Ambulatory Visit: Payer: Self-pay | Admitting: Cardiovascular Disease

## 2014-04-16 ENCOUNTER — Encounter (HOSPITAL_COMMUNITY): Payer: Self-pay | Admitting: Pharmacy Technician

## 2014-04-16 MED ORDER — SODIUM CHLORIDE 0.45 % IV SOLN: INTRAVENOUS | Status: DC

## 2014-04-17 ENCOUNTER — Encounter (HOSPITAL_COMMUNITY): Payer: BC Managed Care – PPO | Admitting: Anesthesiology

## 2014-04-17 ENCOUNTER — Ambulatory Visit (HOSPITAL_COMMUNITY)
Admission: RE | Admit: 2014-04-17 | Discharge: 2014-04-17 | Disposition: A | Discharge status: Home / Self Care | Payer: BC Managed Care – PPO | Source: Ambulatory Visit | Attending: Cardiovascular Disease | Admitting: Cardiovascular Disease

## 2014-04-17 ENCOUNTER — Encounter (HOSPITAL_COMMUNITY)
Admission: RE | Disposition: A | Discharge status: Home / Self Care | Payer: Self-pay | Source: Ambulatory Visit | Attending: Cardiovascular Disease

## 2014-04-17 ENCOUNTER — Ambulatory Visit (HOSPITAL_COMMUNITY): Payer: BC Managed Care – PPO | Admitting: Anesthesiology

## 2014-04-17 ENCOUNTER — Encounter (HOSPITAL_COMMUNITY): Payer: Self-pay | Admitting: *Deleted

## 2014-04-17 DIAGNOSIS — I10 Essential (primary) hypertension: Secondary | ICD-10-CM | POA: Insufficient documentation

## 2014-04-17 DIAGNOSIS — E119 Type 2 diabetes mellitus without complications: Secondary | ICD-10-CM | POA: Insufficient documentation

## 2014-04-17 DIAGNOSIS — Z87891 Personal history of nicotine dependence: Secondary | ICD-10-CM | POA: Insufficient documentation

## 2014-04-17 DIAGNOSIS — I4891 Unspecified atrial fibrillation: Secondary | ICD-10-CM | POA: Insufficient documentation

## 2014-04-17 DIAGNOSIS — Z01810 Encounter for preprocedural cardiovascular examination: Secondary | ICD-10-CM

## 2014-04-17 DIAGNOSIS — Z880 Allergy status to penicillin: Secondary | ICD-10-CM | POA: Insufficient documentation

## 2014-04-17 DIAGNOSIS — E785 Hyperlipidemia, unspecified: Secondary | ICD-10-CM | POA: Insufficient documentation

## 2014-04-17 DIAGNOSIS — Z79899 Other long term (current) drug therapy: Secondary | ICD-10-CM | POA: Insufficient documentation

## 2014-04-17 DIAGNOSIS — Z7901 Long term (current) use of anticoagulants: Secondary | ICD-10-CM | POA: Insufficient documentation

## 2014-04-17 DIAGNOSIS — Z7982 Long term (current) use of aspirin: Secondary | ICD-10-CM | POA: Insufficient documentation

## 2014-04-17 HISTORY — PX: CARDIOVERSION: SHX1299

## 2014-04-17 LAB — PROTIME-INR
INR: 2.99 — AB (ref 0.00–1.49)
Prothrombin Time: 30 seconds — ABNORMAL HIGH (ref 11.6–15.2)

## 2014-04-17 SURGERY — CARDIOVERSION
Anesthesia: Monitor Anesthesia Care

## 2014-04-17 MED ORDER — SODIUM CHLORIDE 0.9 % IV SOLN
INTRAVENOUS | Status: DC | PRN
  Administered 2014-04-17: 12:00:00 via INTRAVENOUS

## 2014-04-17 MED ORDER — PROPOFOL 10 MG/ML IV BOLUS
INTRAVENOUS | Status: DC | PRN
  Administered 2014-04-17: 60 mg via INTRAVENOUS
  Administered 2014-04-17 (×2): 20 mg via INTRAVENOUS

## 2014-04-17 MED ORDER — FLECAINIDE ACETATE 150 MG PO TABS: 150.0000 mg | ORAL_TABLET | Freq: Two times a day (BID) | ORAL | Status: DC

## 2014-04-17 NOTE — H&P (View-Only) (Signed)
Peggy Chen Date of Birth: 1951-11-22 Medical Record #[id number]  History of Present Illness: Peggy Chen is seen back today for a follow up visit. This is a 2 week check. Seen for Dr. Acie Fredrickson. She is a 63 year old female with PAF - now on flecainide. Remains on coumadin - followed by Dr. Dagmar Hait. Other issues include HTN, HLD and DM. No known CAD.   Seen back in March of 2015 by Dr. Acie Fredrickson - she was in atrial fib - he started her on Flecainide - plan was to come back and titrate up if needed - then consider cardioversion - proceed with stress Myoview if she ends up staying on this dose.   I saw her 2 weeks ago - still out of rhythm. Flecainide was increased. We obtained several past INR's - all have been therapeutic. They were as follows:   03/21/14 INR was 2.9  02/07/2014 INR was 2.7  12/26/2013 was 2.6  11/21/2013 was 2.7  Comes back today. Here with her husband. Still out of rhythm. She is easily fatigued and gets short of breath. Still knows she is "out". No chest pain. No passing out spells. Has been on coumadin long term - dose has been stable for some time.  No bleeding/bruising. Anxious to proceed with the next step. Echo was updated and was ok - just mild LAE.    Current Outpatient Prescriptions  Medication Sig Dispense Refill  . aspirin 81 MG tablet Take 81 mg by mouth daily.        . citalopram (CELEXA) 10 MG tablet Take 10 mg by mouth daily.      . cyanocobalamin 1000 MCG tablet Take 100 mcg by mouth daily.      . flecainide (TAMBOCOR) 50 MG tablet Take 2 tablets (100 mg total) by mouth 2 (two) times daily.  035 tablet  3  . folic acid (FOLVITE) 1 MG tablet Take 1 mg by mouth daily.        . metoprolol (TOPROL-XL) 200 MG 24 hr tablet TAKE 1 TABLET BY MOUTH EVERY DAY  90 tablet  0  . Olmesartan-Amlodipine-HCTZ (TRIBENZOR) 40-5-25 MG TABS Take by mouth daily.        . propranolol (INDERAL) 10 MG tablet TAKE 1 TABLET (10 MG TOTAL) BY MOUTH DAILY AS NEEDED.  30 tablet  0  .  Saxagliptin-Metformin (KOMBIGLYZE XR) 2.04-999 MG TB24 Take by mouth 2 (two) times daily.        Marland Kitchen warfarin (COUMADIN) 5 MG tablet Take 5 mg by mouth as directed.        . zolpidem (AMBIEN) 5 MG tablet Take 2.5 mg by mouth at bedtime.         No current facility-administered medications for this visit.    Allergies  Allergen Reactions  . Penicillins     Past Medical History  Diagnosis Date  . Diabetes mellitus   . Hyperlipidemia   . Hypertension   . Back pain   . AF (atrial fibrillation)     Past Surgical History  Procedure Laterality Date  . Tonsillectomy    . Breast cyst removal      RIGHT BREAST  . Colonoscopy    . US echocardiography  12/30/2009    EF 55-60%    History  Smoking status  . Former Smoker  . Quit date: 06/22/1986  Smokeless tobacco  . Not on file    History  Alcohol Use No    Family History  Problem Relation Age  of Onset  . COPD Mother   . Heart attack Father     Review of Systems: The review of systems is per the HPI.  All other systems were reviewed and are negative.  Physical Exam: BP 110/70  Pulse 84  Ht 5\' 5"  (1.651 m)  Wt 225 lb (102.059 kg)  BMI 37.44 kg/m2 Patient is very pleasant and in no acute distress. Skin is warm and dry. Color is normal.  HEENT is unremarkable. Normocephalic/atraumatic. PERRL. Sclera are nonicteric. Neck is supple. No masses. No JVD. Lungs are clear. Cardiac exam shows a regular rate and rhythm. Abdomen is soft. Extremities are without edema. Gait and ROM are intact. No gross neurologic deficits noted.  LABORATORY DATA: No results found for this basename: WBC, HGB, HCT, PLT, GLUCOSE, CHOL, TRIG, HDL, LDLDIRECT, LDLCALC, ALT, AST, NA, K, CL, CREATININE, BUN, CO2, TSH, PSA, INR, GLUF, HGBA1C, MICROALBUR   EKG today shows atrial fib with a controlled VR of 84.   Echo Study Conclusions  - Left ventricle: The cavity size was normal. Wall thickness was normal. Systolic function was normal. The  estimated ejection fraction was in the range of 50% to 55%. Wall motion was normal; there were no regional wall motion abnormalities. - Left atrium: The atrium was mildly dilated. - Atrial septum: No defect or patent foramen ovale was identified.   Assessment / Plan: 1. Atrial fib - rate is controlled - on Flecainide 100 mg BID. Will plan for cardioversion - then arrange for stress Myoview if she remains on long term Flecainide.   2.HTN - blood pressure looks good on current regimen.   3. HLD   4. Chronic coumadin - has been on a stable dose for some time - rechecking today. Prior INRs as noted above.  Check labs today, send for CXR and cardioversion is arranged with Dr. Acie Fredrickson for next week.   Patient is agreeable to this plan and will call if any problems develop in the interim.   Burtis Junes, RN, [place]  [address] [place]  [place], [place] [address]  [phone]

## 2014-04-17 NOTE — Anesthesia Postprocedure Evaluation (Signed)
  Anesthesia Post-op Note  Patient: Peggy Chen  Procedure(s) Performed: Procedure(s): CARDIOVERSION (N/A)  Patient Location: PACU and Endoscopy Unit  Anesthesia Type:General  Level of Consciousness: awake, alert  and oriented  Airway and Oxygen Therapy: Patient Spontanous Breathing and Patient connected to nasal cannula oxygen  Post-op Pain: none  Post-op Assessment: Post-op Vital signs reviewed and Patient's Cardiovascular Status Stable  Post-op Vital Signs: Reviewed and stable  Last Vitals:  Filed Vitals:   04/17/14 1221  BP: 101/51  Pulse: 90  Resp: 21    Complications: No apparent anesthesia complications

## 2014-04-17 NOTE — Interval H&P Note (Signed)
History and Physical Interval Note:  04/17/2014 12:02 PM  Peggy Chen  has presented today for surgery, with the diagnosis of AFIB  The various methods of treatment have been discussed with the patient and family. After consideration of risks, benefits and other options for treatment, the patient has consented to  Procedure(s): CARDIOVERSION (N/A) as a surgical intervention .  The patient's history has been reviewed, patient examined, no change in status, stable for surgery.  I have reviewed the patient's chart and labs.  Questions were answered to the patient's satisfaction.     Wonda Cheng Nahser

## 2014-04-17 NOTE — Transfer of Care (Signed)
Immediate Anesthesia Transfer of Care Note  Patient: Peggy Chen  Procedure(s) Performed: Procedure(s): CARDIOVERSION (N/A)  Patient Location: PACU and Endoscopy Unit  Anesthesia Type:General  Level of Consciousness: awake, alert  and oriented  Airway & Oxygen Therapy: Patient Spontanous Breathing and Patient connected to nasal cannula oxygen  Post-op Assessment: Report given to PACU RN and Post -op Vital signs reviewed and stable  Post vital signs: Reviewed and stable  Complications: No apparent anesthesia complications

## 2014-04-17 NOTE — Anesthesia Preprocedure Evaluation (Addendum)
Anesthesia Evaluation  Patient identified by MRN, date of birth, ID band Patient awake    Reviewed: Allergy & Precautions, H&P , NPO status , Patient's Chart, lab work & pertinent test results, reviewed documented beta blocker date and time   History of Anesthesia Complications (+) PROLONGED EMERGENCE  Airway Mallampati: I TM Distance: >3 FB Neck ROM: Full    Dental  (+) Dental Advisory Given   Pulmonary former smoker (quit 1987),  breath sounds clear to auscultation        Cardiovascular hypertension, Pt. on home beta blockers and Pt. on medications + dysrhythmias Atrial Fibrillation Rhythm:Irregular Rate:Normal  4/15 ECHO: EF 50-55%, valves OK   Neuro/Psych negative neurological ROS     GI/Hepatic negative GI ROS, Neg liver ROS,   Endo/Other  diabetesMorbid obesity  Renal/GU negative Renal ROS     Musculoskeletal   Abdominal (+) + obese,   Peds  Hematology  (+) Blood dyscrasia (coumadin, INR 2.99), ,   Anesthesia Other Findings   Reproductive/Obstetrics                        Anesthesia Physical Anesthesia Plan  ASA: III  Anesthesia Plan: General   Post-op Pain Management:    Induction: Intravenous  Airway Management Planned: Mask  Additional Equipment:   Intra-op Plan:   Post-operative Plan:   Informed Consent: I have reviewed the patients History and Physical, chart, labs and discussed the procedure including the risks, benefits and alternatives for the proposed anesthesia with the patient or authorized representative who has indicated his/her understanding and acceptance.   Dental advisory given  Plan Discussed with: CRNA and Surgeon  Anesthesia Plan Comments: (Plan routine monitors, GA for cardioversion)       Anesthesia Quick Evaluation

## 2014-04-17 NOTE — CV Procedure (Signed)
    Cardioversion Note  KIELYN KARDELL [id number] [birth year]  Procedure: DC Cardioversion Indications: atrial fib  Procedure Details Consent: Obtained Time Out: Verified patient identification, verified procedure, site/side was marked, verified correct patient position, special equipment/implants available, Radiology Safety Procedures followed,  medications/allergies/relevent history reviewed, required imaging and test results available.  Performed  The patient has been on adequate anticoagulation.  The patient received IV Propofol 100 mg  for sedation.  Synchronous cardioversion was performed at 120, 200, 200  joules.  The cardioversion was unsuccessful     Complications: No apparent complications Patient did tolerate procedure well.   Thayer Headings, Brooke Bonito., MD, [facility] 04/17/2014, 12:17 PM

## 2014-04-17 NOTE — Discharge Instructions (Signed)
Electrical Cardioversion °Electrical cardioversion is the delivery of a jolt of electricity to change the rhythm of the heart. Sticky patches or metal paddles are placed on the chest to deliver the electricity from a device. This is done to restore a normal rhythm. A rhythm that is too fast or not regular keeps the heart from pumping well. °Electrical cardioversion is done in an emergency if:  °· There is low or no blood pressure as a result of the heart rhythm.   °· Normal rhythm must be restored as fast as possible to protect the brain and heart from further damage.   °· It may save a life. °Cardioversion may be done for heart rhythms that are not immediately life-threatening, such as atrial fibrillation or flutter, in which:  °· The heart is beating too fast or is not regular.   °· Medicine to change the rhythm has not worked.   °· It is safe to wait in order to allow time for preparation. °· Symptoms of the abnormal rhythm are bothersome. °· The risk of stroke and other serious complications can be reduced. °LET YOUR CAREGIVER KNOW ABOUT:  °· All medicines you are taking, including vitamins, herbs, eye drops, creams, and over-the-counter medicines.   °· Previous problems you or members of your family have had with the use of anesthetics.   °· Any blood disorders you have.   °· Previous surgeries you have had.   °· Medical conditions you have. °RISKS AND COMPLICATIONS  °Generally, this is a safe procedure. However, as with any procedure, complications can occur. Possible complications include:  °· Breathing problems related to the anesthetic used. °· Cardiac arrest This risk is rare. °· A blood clot that breaks free and travels to other parts of your body. This could cause a stroke or other problems. The risk of this is lowered by use of blood thinning medicine (anticoagulant) prior to the procedure. °BEFORE THE PROCEDURE  °· You may have tests to detect blood clots in your heart and evaluate heart  function.  °· You may start taking anticoagulants so your blood does not clot as easily.   °· Medicines may be given to help stabilize your heart rate and rhythm. °PROCEDURE °· You will be given medicine through an IV tube to reduce discomfort and make you sleepy (sedative).   °· An electrical shock will be delivered. °AFTER THE PROCEDURE °Your heart rhythm will be watched to make sure it does not change. You may be able to go home within a few hours.  °Document Released: 11/13/2002 Document Revised: 09/13/2013 Document Reviewed: 06/07/2013 °[employer]® Patient Information ©2014 [employer]. ° °

## 2014-04-17 NOTE — Anesthesia Procedure Notes (Addendum)
Performed by: Eligha Bridegroom   Procedure Name: MAC Date/Time: 04/17/2014 12:10 PM Performed by: Eligha Bridegroom Pre-anesthesia Checklist: Patient identified, Emergency Drugs available, Suction available and Patient being monitored Patient Re-evaluated:Patient Re-evaluated prior to inductionOxygen Delivery Method: Ambu bag Preoxygenation: Pre-oxygenation with 100% oxygen Intubation Type: IV induction Ventilation: Mask ventilation without difficulty Dental Injury: Teeth and Oropharynx as per pre-operative assessment

## 2014-04-18 ENCOUNTER — Encounter (HOSPITAL_COMMUNITY): Payer: Self-pay | Admitting: Cardiovascular Disease

## 2014-05-01 ENCOUNTER — Encounter (HOSPITAL_COMMUNITY): Payer: BC Managed Care – PPO

## 2014-05-07 HISTORY — PX: CARDIOVERSION: SHX1299

## 2014-05-14 ENCOUNTER — Other Ambulatory Visit: Payer: Self-pay | Admitting: Cardiovascular Disease

## 2014-05-16 ENCOUNTER — Encounter: Payer: Self-pay | Admitting: Cardiovascular Disease

## 2014-05-16 ENCOUNTER — Ambulatory Visit (INDEPENDENT_AMBULATORY_CARE_PROVIDER_SITE_OTHER): Payer: BC Managed Care – PPO | Admitting: Cardiovascular Disease

## 2014-05-16 VITALS — BP 126/78 | HR 59 | Ht 64.0 in | Wt 209.0 lb

## 2014-05-16 DIAGNOSIS — I4891 Unspecified atrial fibrillation: Secondary | ICD-10-CM

## 2014-05-16 NOTE — Assessment & Plan Note (Signed)
Peggy Chen is doing well.  [name] converted to sinus rhythm on flecainide. We need to perform a stress Myoview study.  Like for her to return in a week or so for a stress Myoview study. She will perform the study while taking metoprolol and flecainide. The stress test will serve 2 purposes. We need to make sure that her QRS remains normal at peak exercise. I suspect that her heart rate response we'll not be adequate and I suspect that she'll need to be given Lexiscan as well for an adequate study study. I think that this will be sufficient to check the effect of flecainide and also rule out ischemia.

## 2014-05-16 NOTE — Progress Notes (Signed)
Peggy Chen Date of Birth  12-15-1950       [facility] [address], [place], [place]  [address]   [place], [place]  [address] [phone]     [phone]   Fax  [phone]    Fax [phone]  Problem List: 1. Intermittent atrial fibrillation 2. Diabetes mellitus 3. Hyperlipidemia 4. Hypertension  History of Present Illness:  63 year old female with a history of intermittent atrial fibrillation. She also has a history of diabetes mellitus, hyperlipidemia, and hypertension. She still occasionally has episodes of A-Fib that last 1-3 days.  They can be as short as 3 hours.  She takes propranolol which seems to help.  She is on coumadin.   She does not exercise much at all.  She actually feels better when she is active.    February 26, 2014:  Still having some atrial fib.  On coumadin.    May 16, 2014: We attempted cardioversion Peggy Chen on Apr 17, 2014.  It was not successful. Since that time, we have started her on flecainide. [name] converted to sinus rhythm with the flecainide.   Current Outpatient Prescriptions on File Prior to Visit  Medication Sig Dispense Refill  . citalopram (CELEXA) 10 MG tablet Take 10 mg by mouth daily.      . cyanocobalamin 1000 MCG tablet Take 100 mcg by mouth daily.      . flecainide (TAMBOCOR) 150 MG tablet Take 1 tablet (150 mg total) by mouth 2 (two) times daily.  60 tablet  12  . folic acid (FOLVITE) 1 MG tablet Take 1 mg by mouth daily.       Marland Kitchen HYDROcodone-acetaminophen (NORCO/VICODIN) 5-325 MG per tablet Take 1 tablet by mouth every 6 (six) hours as needed for moderate pain.      . metoprolol (TOPROL-XL) 200 MG 24 hr tablet TAKE 1 TABLET BY MOUTH EVERY DAY  90 tablet  0  . Olmesartan-Amlodipine-HCTZ (TRIBENZOR) 40-5-25 MG TABS Take 1 tablet by mouth daily.       . propranolol (INDERAL) 10 MG tablet Take 10 mg by mouth daily as needed (for Afib).      .  Saxagliptin-Metformin (KOMBIGLYZE XR) 2.04-999 MG TB24 Take 2 tablets by mouth daily.       Marland Kitchen warfarin (COUMADIN) 5 MG tablet Take 5-7.5 mg by mouth daily. Take 7.5 mg by mouth on Monday.  Take 5 mg by mouth on all other days.      Marland Kitchen zolpidem (AMBIEN) 5 MG tablet Take 2.5 mg by mouth at bedtime.        No current facility-administered medications on file prior to visit.    Allergies  Allergen Reactions  . Penicillins Hives    Past Medical History  Diagnosis Date  . Diabetes mellitus   . Hyperlipidemia   . Hypertension   . Back pain   . AF (atrial fibrillation)     Past Surgical History  Procedure Laterality Date  . Tonsillectomy    . Breast cyst removal      RIGHT BREAST  . Colonoscopy    . US echocardiography  12/30/2009    EF 55-60%  . Colon surgery      May 2000  . Cardioversion N/A 04/17/2014    Procedure: CARDIOVERSION;  Surgeon: Thayer Headings, MD;  [facility] END[facility]COPY;  Service: Cardiovascular;  Laterality: N/A;    History  Smoking status  . Former Smoker  .  Quit date: 06/22/1986  Smokeless tobacco  . Not on file    History  Alcohol Use  . Yes    Comment: occasional    Family History  Problem Relation Age of Onset  . COPD Mother   . Heart attack Father     Reviw of Systems:  Reviewed in the HPI.  All other systems are negative.  Physical Exam: Blood pressure 126/78, pulse 59, height 5\' 4"  (1.626 m), weight 209 lb (94.802 kg). General: Well developed, well nourished, in no acute distress.  Head: Normocephalic, atraumatic, sclera non-icteric, mucus membranes are moist,   Neck: Supple. Carotids are 2 + without bruits. No JVD , thick neck  Lungs: Clear   Heart: Irregularly irregular, her heart rate is tachycardic.  Abdomen: Soft, non-tender, non-distended with normal bowel sounds.  Msk:  Strength and tone are normal   Extremities: No clubbing or cyanosis. No edema.  Distal pedal pulses are 2+ and equal    Neuro: CN II - XII intact.   Alert and oriented X 3.   Psych:  Normal   ECG: May 16, 2014:  Sinus brady at 7.  Low voltage.    Assessment / Plan:

## 2014-05-16 NOTE — Patient Instructions (Addendum)
Your physician has requested that you have en exercise stress myoview. For further information please visit [email or web address]. Please follow instruction sheet, as given.  Your physician recommends that you continue on your current medications as directed. Please refer to the Current Medication list given to you today.   Your physician wants you to follow-up in: [place] DR. Acie Fredrickson You will receive a reminder letter in the mail two months in advance. If you don't receive a letter, please call our office to schedule the follow-up appointment.

## 2014-05-22 ENCOUNTER — Other Ambulatory Visit: Payer: Self-pay | Admitting: Cardiovascular Disease

## 2014-06-04 ENCOUNTER — Ambulatory Visit (HOSPITAL_COMMUNITY): Payer: BC Managed Care – PPO | Attending: Cardiovascular Disease | Admitting: Radiology

## 2014-06-04 VITALS — BP 125/74 | HR 54 | Ht 64.0 in | Wt 223.0 lb

## 2014-06-04 DIAGNOSIS — I10 Essential (primary) hypertension: Secondary | ICD-10-CM

## 2014-06-04 DIAGNOSIS — R0609 Other forms of dyspnea: Secondary | ICD-10-CM | POA: Insufficient documentation

## 2014-06-04 DIAGNOSIS — R0602 Shortness of breath: Secondary | ICD-10-CM | POA: Insufficient documentation

## 2014-06-04 DIAGNOSIS — I4891 Unspecified atrial fibrillation: Secondary | ICD-10-CM

## 2014-06-04 DIAGNOSIS — R0989 Other specified symptoms and signs involving the circulatory and respiratory systems: Principal | ICD-10-CM | POA: Insufficient documentation

## 2014-06-04 MED ORDER — REGADENOSON 0.4 MG/5ML IV SOLN
0.4000 mg | Freq: Once | INTRAVENOUS | Status: AC
  Administered 2014-06-04: 0.4 mg via INTRAVENOUS

## 2014-06-04 MED ORDER — TECHNETIUM TC 99M SESTAMIBI GENERIC - CARDIOLITE
30.0000 | Freq: Once | INTRAVENOUS | Status: AC | PRN
  Administered 2014-06-04: 30 via INTRAVENOUS

## 2014-06-04 MED ORDER — TECHNETIUM TC 99M SESTAMIBI GENERIC - CARDIOLITE
10.0000 | Freq: Once | INTRAVENOUS | Status: AC | PRN
  Administered 2014-06-04: 10 via INTRAVENOUS

## 2014-06-04 NOTE — Progress Notes (Signed)
[facility] [address] [place], [place] [address] [phone]    Cardiology Nuclear Med Study  Peggy Chen is a 63 y.o. female     MRN : [id number]     DOB: Mar 18, 1951  Procedure Date: 06/04/2014  Nuclear Med Background Indication for Stress Test:  Evaluation for Ischemia, and Assess For Ventricular Arrhythmias due to recently starting Flecainide History:  No known CAD, Afib (intermitt.), Echo 2015 EF 50-55%, MPI 2007 @ [facility] (ok per pt) Cardiac Risk Factors: Family History - CAD, History of Smoking, Hypertension, Lipids and NIDDM  Symptoms:  DOE and SOB   Nuclear Pre-Procedure Caffeine/Decaff Intake:  None> 12 hrs NPO After: 6:30pm   Lungs:  clear O2 Sat: 94% on room air. IV 0.9% NS with Angio Cath:  22g  IV Site: R Wrist x 1, tolerated well IV Started by:  Irven Baltimore, RN  Chest Size (in):  44 Cup Size: C  Height: 5\' 4"  (1.626 m)  Weight:  223 lb (101.152 kg)  BMI:  Body mass index is 38.26 kg/(m^2). Tech Comments:  Patient took Flecainide, Toprol, and Tribenzor per MD. Irven Baltimore, RN.    Nuclear Med Study 1 or 2 day study: 1 day  Stress Test Type:  Treadmill/Lexiscan  Reading MD: N/A  Order Authorizing Provider:  Mertie Moores, MD  Resting Radionuclide: Technetium 52m Sestamibi  Resting Radionuclide Dose: 11.0 mCi   Stress Radionuclide:  Technetium 64m Sestamibi  Stress Radionuclide Dose: 33.0 mCi           Stress Protocol Rest HR: 54 Stress HR: 91  Rest BP: 125/74 Stress BP: 155/70  Exercise Time (min): n/a METS: n/a           Dose of Adenosine (mg):  n/a Dose of Lexiscan: 0.4 mg  Dose of Atropine (mg): n/a Dose of Dobutamine: n/a mcg/kg/min (at max HR)  Stress Test Technologist: Glade Lloyd, BS-ES  Nuclear Technologist:  Charlton Amor, CNMT     Rest Procedure:  Myocardial perfusion imaging was performed at rest 45 minutes following the intravenous administration of Technetium 40m Sestamibi. Rest ECG: NSR - Normal  EKG  Stress Procedure:  The patient received IV Lexiscan 0.4 mg over 15-seconds with concurrent low level exercise and then Technetium 65m Sestamibi was injected at 30-seconds while the patient continued walking one more minute.  Quantitative spect images were obtained after a 45-minute delay.  During the infusion of Lexiscan the patient complained of SOB.  This resolved in recovery.  Stress ECG: No significant change from baseline ECG  QPS Raw Data Images:  Normal; no motion artifact; normal heart/lung ratio. Stress Images:  Normal homogeneous uptake in all areas of the myocardium. Rest Images:  Normal homogeneous uptake in all areas of the myocardium. Subtraction (SDS):  No evidence of ischemia. Transient Ischemic Dilatation (Normal <1.22):  1.06 Lung/Heart Ratio (Normal <0.45):  0.43  Quantitative Gated Spect Images QGS EDV:  103 ml QGS ESV:  39 ml  Impression Exercise Capacity:  Lexiscan with low level exercise. BP Response:  Normal blood pressure response. Clinical Symptoms:  There is dyspnea. ECG Impression:  No significant ST segment change suggestive of ischemia. Comparison with Prior Nuclear Study: No significant change from previous study  Overall Impression:  Normal stress nuclear study.  LV Ejection Fraction: 63%.  LV Wall Motion:  NL LV Function; NL Wall Motion  Darlin Coco MD

## 2014-08-11 ENCOUNTER — Other Ambulatory Visit: Payer: Self-pay | Admitting: Cardiovascular Disease

## 2014-11-08 ENCOUNTER — Other Ambulatory Visit: Payer: Self-pay | Admitting: Cardiovascular Disease

## 2014-12-11 DIAGNOSIS — Z7901 Long term (current) use of anticoagulants: Secondary | ICD-10-CM | POA: Diagnosis not present

## 2014-12-11 DIAGNOSIS — I480 Paroxysmal atrial fibrillation: Secondary | ICD-10-CM | POA: Diagnosis not present

## 2014-12-14 ENCOUNTER — Other Ambulatory Visit: Payer: Self-pay | Admitting: Dermatology

## 2014-12-14 DIAGNOSIS — C44319 Basal cell carcinoma of skin of other parts of face: Secondary | ICD-10-CM | POA: Diagnosis not present

## 2014-12-19 DIAGNOSIS — F439 Reaction to severe stress, unspecified: Secondary | ICD-10-CM | POA: Diagnosis not present

## 2014-12-19 DIAGNOSIS — I480 Paroxysmal atrial fibrillation: Secondary | ICD-10-CM | POA: Diagnosis not present

## 2014-12-19 DIAGNOSIS — E1151 Type 2 diabetes mellitus with diabetic peripheral angiopathy without gangrene: Secondary | ICD-10-CM | POA: Diagnosis not present

## 2014-12-19 DIAGNOSIS — E669 Obesity, unspecified: Secondary | ICD-10-CM | POA: Diagnosis not present

## 2014-12-19 DIAGNOSIS — R05 Cough: Secondary | ICD-10-CM | POA: Diagnosis not present

## 2014-12-19 DIAGNOSIS — Z6823 Body mass index (BMI) 23.0-23.9, adult: Secondary | ICD-10-CM | POA: Diagnosis not present

## 2014-12-19 DIAGNOSIS — Z7901 Long term (current) use of anticoagulants: Secondary | ICD-10-CM | POA: Diagnosis not present

## 2014-12-19 DIAGNOSIS — I10 Essential (primary) hypertension: Secondary | ICD-10-CM | POA: Diagnosis not present

## 2015-01-10 DIAGNOSIS — I480 Paroxysmal atrial fibrillation: Secondary | ICD-10-CM | POA: Diagnosis not present

## 2015-01-10 DIAGNOSIS — Z7901 Long term (current) use of anticoagulants: Secondary | ICD-10-CM | POA: Diagnosis not present

## 2015-01-30 DIAGNOSIS — M545 Low back pain: Secondary | ICD-10-CM | POA: Diagnosis not present

## 2015-01-30 DIAGNOSIS — Z6836 Body mass index (BMI) 36.0-36.9, adult: Secondary | ICD-10-CM | POA: Diagnosis not present

## 2015-02-04 DIAGNOSIS — Z85828 Personal history of other malignant neoplasm of skin: Secondary | ICD-10-CM | POA: Diagnosis not present

## 2015-02-04 DIAGNOSIS — C44319 Basal cell carcinoma of skin of other parts of face: Secondary | ICD-10-CM | POA: Diagnosis not present

## 2015-02-06 ENCOUNTER — Other Ambulatory Visit: Payer: Self-pay | Admitting: Cardiology

## 2015-02-07 DIAGNOSIS — R8290 Unspecified abnormal findings in urine: Secondary | ICD-10-CM | POA: Diagnosis not present

## 2015-02-07 DIAGNOSIS — Z7901 Long term (current) use of anticoagulants: Secondary | ICD-10-CM | POA: Diagnosis not present

## 2015-02-07 DIAGNOSIS — R8299 Other abnormal findings in urine: Secondary | ICD-10-CM | POA: Diagnosis not present

## 2015-02-07 DIAGNOSIS — I480 Paroxysmal atrial fibrillation: Secondary | ICD-10-CM | POA: Diagnosis not present

## 2015-03-04 DIAGNOSIS — Z85828 Personal history of other malignant neoplasm of skin: Secondary | ICD-10-CM | POA: Diagnosis not present

## 2015-03-04 DIAGNOSIS — D1801 Hemangioma of skin and subcutaneous tissue: Secondary | ICD-10-CM | POA: Diagnosis not present

## 2015-03-04 DIAGNOSIS — D224 Melanocytic nevi of scalp and neck: Secondary | ICD-10-CM | POA: Diagnosis not present

## 2015-03-04 DIAGNOSIS — B078 Other viral warts: Secondary | ICD-10-CM | POA: Diagnosis not present

## 2015-03-04 DIAGNOSIS — L821 Other seborrheic keratosis: Secondary | ICD-10-CM | POA: Diagnosis not present

## 2015-03-04 DIAGNOSIS — D225 Melanocytic nevi of trunk: Secondary | ICD-10-CM | POA: Diagnosis not present

## 2015-03-12 DIAGNOSIS — Z7901 Long term (current) use of anticoagulants: Secondary | ICD-10-CM | POA: Diagnosis not present

## 2015-03-12 DIAGNOSIS — I480 Paroxysmal atrial fibrillation: Secondary | ICD-10-CM | POA: Diagnosis not present

## 2015-04-11 DIAGNOSIS — I480 Paroxysmal atrial fibrillation: Secondary | ICD-10-CM | POA: Diagnosis not present

## 2015-04-11 DIAGNOSIS — Z7901 Long term (current) use of anticoagulants: Secondary | ICD-10-CM | POA: Diagnosis not present

## 2015-04-15 ENCOUNTER — Ambulatory Visit: Payer: Self-pay | Admitting: Cardiovascular Disease

## 2015-04-21 ENCOUNTER — Other Ambulatory Visit: Payer: Self-pay | Admitting: Cardiovascular Disease

## 2015-04-22 ENCOUNTER — Encounter: Payer: Self-pay | Admitting: Cardiovascular Disease

## 2015-05-10 ENCOUNTER — Other Ambulatory Visit: Payer: Self-pay

## 2015-05-10 MED ORDER — METOPROLOL SUCCINATE ER 200 MG PO TB24: 200.0000 mg | ORAL_TABLET | Freq: Every day | ORAL | Status: DC

## 2015-05-23 ENCOUNTER — Other Ambulatory Visit: Payer: Self-pay | Admitting: Cardiovascular Disease

## 2015-06-17 ENCOUNTER — Ambulatory Visit (INDEPENDENT_AMBULATORY_CARE_PROVIDER_SITE_OTHER): Payer: Medicare Other | Admitting: Physician Assistant

## 2015-06-17 ENCOUNTER — Encounter: Payer: Self-pay | Admitting: Physician Assistant

## 2015-06-17 ENCOUNTER — Ambulatory Visit: Payer: Self-pay | Admitting: Physician Assistant

## 2015-06-17 VITALS — BP 122/70 | HR 58 | Ht 65.0 in | Wt 215.0 lb

## 2015-06-17 DIAGNOSIS — E785 Hyperlipidemia, unspecified: Secondary | ICD-10-CM | POA: Insufficient documentation

## 2015-06-17 DIAGNOSIS — I4891 Unspecified atrial fibrillation: Secondary | ICD-10-CM | POA: Diagnosis not present

## 2015-06-17 DIAGNOSIS — I10 Essential (primary) hypertension: Secondary | ICD-10-CM | POA: Diagnosis not present

## 2015-06-17 MED ORDER — FLECAINIDE ACETATE 150 MG PO TABS: 150.0000 mg | ORAL_TABLET | Freq: Two times a day (BID) | ORAL | Status: DC

## 2015-06-17 MED ORDER — METOPROLOL SUCCINATE ER 200 MG PO TB24: 200.0000 mg | ORAL_TABLET | Freq: Every day | ORAL | Status: DC

## 2015-06-17 NOTE — Progress Notes (Signed)
Cardiology Office Note   Date:  06/17/2015   ID:  Peggy Chen, Peggy Chen 1951-11-17, MRN [id number]  PCP:  Tivis Ringer, MD  Cardiologist:  Dr. Acie Fredrickson  Chief Complaint: yearly f/u    History of Present Illness: Peggy Chen is a 64 y.o. female who presents for yearly follow-up. Patient has a history of paroxysmal atrial fibrillation controlled with flecainide and metoprolol and on Coumadin. She also has hypertension, diabetes mellitus, and hyperlipidemia.  Since she was here last she lost her husband and found out her 70 year old daughter is addicted to heroine. [name] had a rough 6 months. She is now on the donut hole and is unable to afford Tribenzor. [name] been out of it for 3 days. She is in contact with Dr. Elsworth Soho to see if he has samples or wants to change her medications. Does her yearly physicals and lab work. She denies any chest pain, palpitations, dyspnea, dyspnea on exertion, dizziness or presyncope. 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    Past Medical History  Diagnosis Date  . Diabetes mellitus   . Hyperlipidemia   . Hypertension   . Back pain   . AF (atrial fibrillation)     Past Surgical History  Procedure Laterality Date  . Tonsillectomy    . Breast cyst removal      RIGHT BREAST  . Colonoscopy    . US echocardiography  12/30/2009    EF 55-60%  . Colon surgery      May 2000  . Cardioversion N/A 04/17/2014    Procedure: CARDIOVERSION;  Surgeon: Thayer Headings, MD;  Location: [facility] ENDOSCOPY;  Service: Cardiovascular;  Laterality: N/A;     Current Outpatient Prescriptions  Medication Sig Dispense Refill  . aspirin 81 MG tablet Take by mouth.    . citalopram (CELEXA) 10 MG tablet Take 10 mg by mouth daily.    . cyanocobalamin 1000 MCG tablet Take 100 mcg by mouth daily.    . flecainide (TAMBOCOR) 150 MG tablet TAKE 1 TABLET BY MOUTH TWICE A DAY 60 tablet 1  . folic acid (FOLVITE) 1 MG tablet Take 1 mg by mouth daily.     Marland Kitchen  HYDROcodone-acetaminophen (NORCO/VICODIN) 5-325 MG per tablet Take 1 tablet by mouth every 6 (six) hours as needed for moderate pain.    . metoprolol (TOPROL-XL) 200 MG 24 hr tablet Take 1 tablet (200 mg total) by mouth daily. 90 tablet 1  . omega-3 acid ethyl esters (LOVAZA) 1 G capsule Take by mouth.    . Saxagliptin-Metformin (KOMBIGLYZE XR) 2.04-999 MG TB24 Take 2 tablets by mouth daily.     Marland Kitchen warfarin (COUMADIN) 5 MG tablet Take 5-7.5 mg by mouth daily. Take 7.5 mg by mouth on Monday.  Take 5 mg by mouth on all other days.    Marland Kitchen zolpidem (AMBIEN) 5 MG tablet Take 2.5 mg by mouth at bedtime.     . Olmesartan-Amlodipine-HCTZ (TRIBENZOR) 40-5-25 MG TABS Take 1 tablet by mouth daily.      No current facility-administered medications for this visit.    Allergies:   Penicillins    Social History:  The patient  reports that she quit smoking about 29 years ago. She does not have any smokeless tobacco history on file. She reports that she drinks alcohol. She reports that she does not use illicit drugs.   Family History:  The patient's    family history includes COPD in her mother; Heart attack in her  father.    ROS:  Please see the history of present illness.   Otherwise, review of systems are positive for none.   All other systems are reviewed and negative.    PHYSICAL EXAM: VS:  BP 122/70 mmHg  Pulse 58  Ht 5\' 5"  (1.651 m)  Wt 215 lb (97.523 kg)  BMI 35.78 kg/m2 , BMI Body mass index is 35.78 kg/(m^2). GEN: Well nourished, well developed, in no acute distress Neck: no JVD, HJR, carotid bruits, or masses Cardiac: RRR; no murmurs,gallop, rubs, thrill or heave,  Respiratory:  clear to auscultation bilaterally, normal work of breathing GI: soft, nontender, nondistended, + BS MS: no deformity or atrophy Extremities: without cyanosis, clubbing, edema, good distal pulses bilaterally.  Skin: warm and dry, no rash Neuro:  Strength and sensation are intact    EKG:  EKG is ordered  today. The ekg ordered today demonstrates sinus bradycardia 58 bpm with first-degree AV block and nonspecific ST-T wave changes, no acute change   Recent Labs: No results found for requested labs within last 365 days.    Lipid Panel No results found for: CHOL, TRIG, HDL, CHOLHDL, VLDL, LDLCALC, LDLDIRECT    Wt Readings from Last 3 Encounters:  06/17/15 215 lb (97.523 kg)  06/04/14 223 lb (101.152 kg)  05/16/14 209 lb (94.802 kg)      Other studies Reviewed: Additional studies/ records that were reviewed today include and review of the records demonstrates:    ASSESSMENT AND PLAN:  Atrial fibrillation Patient is in normal sinus rhythm. Refill her prescription for flecainide, and metoprolol. Continue Coumadin.  HTN (hypertension) Blood pressure is stable. She is out of Tribenzor for the past 3 days because she is in the donut hole and can't afford it. We have given her samples of this and she is in contact with Dr. Elsworth Soho for samples or change in her prescription.   Hyperlipidemia Managed by Shela Leff, PA-C  06/17/2015 12:20 PM    [facility] [place], [place], [place]  [address] Phone: [phone]; Fax: [phone]

## 2015-06-17 NOTE — Assessment & Plan Note (Signed)
Managed by [facility]

## 2015-06-17 NOTE — Patient Instructions (Signed)
Medication Instructions:   Your physician recommends that you continue on your current medications as directed. Please refer to the Current Medication list given to you today.    Labwork:   Testing/Procedures:   Follow-Up: Your physician wants you to follow-up in:  In one year with  DR Shelbie Ammons You will receive a reminder letter in the mail two months in advance. If you don't receive a letter, please call our office to schedule the follow-up appointment.    Any Other Special Instructions Will Be Listed Below (If Applicable).

## 2015-06-17 NOTE — Assessment & Plan Note (Signed)
Blood pressure is stable. She is out of Tribenzor for the past 3 days because she is in the donut hole and can't afford it. We have given her samples of this and she is in contact with Dr. Elsworth Soho for samples or change in her prescription.

## 2015-06-17 NOTE — Addendum Note (Signed)
Addended by: Claude Manges on: 06/17/2015 01:36 PM   Modules accepted: Orders

## 2015-06-17 NOTE — Assessment & Plan Note (Signed)
Patient is in normal sinus rhythm. Refill her prescription for flecainide, and metoprolol. Continue Coumadin.

## 2015-06-24 DIAGNOSIS — I480 Paroxysmal atrial fibrillation: Secondary | ICD-10-CM | POA: Diagnosis not present

## 2015-06-24 DIAGNOSIS — Z6835 Body mass index (BMI) 35.0-35.9, adult: Secondary | ICD-10-CM | POA: Diagnosis not present

## 2015-06-24 DIAGNOSIS — Z7901 Long term (current) use of anticoagulants: Secondary | ICD-10-CM | POA: Diagnosis not present

## 2015-06-24 DIAGNOSIS — Z1389 Encounter for screening for other disorder: Secondary | ICD-10-CM | POA: Diagnosis not present

## 2015-06-24 DIAGNOSIS — I10 Essential (primary) hypertension: Secondary | ICD-10-CM | POA: Diagnosis not present

## 2015-06-24 DIAGNOSIS — E1151 Type 2 diabetes mellitus with diabetic peripheral angiopathy without gangrene: Secondary | ICD-10-CM | POA: Diagnosis not present

## 2015-06-24 DIAGNOSIS — I2510 Atherosclerotic heart disease of native coronary artery without angina pectoris: Secondary | ICD-10-CM | POA: Diagnosis not present

## 2015-07-21 ENCOUNTER — Other Ambulatory Visit: Payer: Self-pay | Admitting: Internal Medicine

## 2015-07-24 ENCOUNTER — Other Ambulatory Visit: Payer: Self-pay | Admitting: Internal Medicine

## 2015-08-09 DIAGNOSIS — I10 Essential (primary) hypertension: Secondary | ICD-10-CM | POA: Diagnosis not present

## 2015-08-09 DIAGNOSIS — E1151 Type 2 diabetes mellitus with diabetic peripheral angiopathy without gangrene: Secondary | ICD-10-CM | POA: Diagnosis not present

## 2015-08-09 DIAGNOSIS — I2510 Atherosclerotic heart disease of native coronary artery without angina pectoris: Secondary | ICD-10-CM | POA: Diagnosis not present

## 2015-08-09 DIAGNOSIS — N390 Urinary tract infection, site not specified: Secondary | ICD-10-CM | POA: Diagnosis not present

## 2015-08-09 DIAGNOSIS — E785 Hyperlipidemia, unspecified: Secondary | ICD-10-CM | POA: Diagnosis not present

## 2015-08-09 DIAGNOSIS — R8299 Other abnormal findings in urine: Secondary | ICD-10-CM | POA: Diagnosis not present

## 2015-08-16 DIAGNOSIS — Z0000 Encounter for general adult medical examination without abnormal findings: Secondary | ICD-10-CM | POA: Diagnosis not present

## 2015-08-16 DIAGNOSIS — Z23 Encounter for immunization: Secondary | ICD-10-CM | POA: Diagnosis not present

## 2015-08-16 DIAGNOSIS — Z1389 Encounter for screening for other disorder: Secondary | ICD-10-CM | POA: Diagnosis not present

## 2015-08-16 DIAGNOSIS — G629 Polyneuropathy, unspecified: Secondary | ICD-10-CM | POA: Diagnosis not present

## 2015-08-16 DIAGNOSIS — M1990 Unspecified osteoarthritis, unspecified site: Secondary | ICD-10-CM | POA: Diagnosis not present

## 2015-08-16 DIAGNOSIS — Z7901 Long term (current) use of anticoagulants: Secondary | ICD-10-CM | POA: Diagnosis not present

## 2015-08-16 DIAGNOSIS — R8299 Other abnormal findings in urine: Secondary | ICD-10-CM | POA: Diagnosis not present

## 2015-08-16 DIAGNOSIS — R3989 Other symptoms and signs involving the genitourinary system: Secondary | ICD-10-CM | POA: Diagnosis not present

## 2015-08-16 DIAGNOSIS — G4700 Insomnia, unspecified: Secondary | ICD-10-CM | POA: Diagnosis not present

## 2015-08-16 DIAGNOSIS — E1151 Type 2 diabetes mellitus with diabetic peripheral angiopathy without gangrene: Secondary | ICD-10-CM | POA: Diagnosis not present

## 2015-08-16 DIAGNOSIS — N390 Urinary tract infection, site not specified: Secondary | ICD-10-CM | POA: Diagnosis not present

## 2015-08-16 DIAGNOSIS — I480 Paroxysmal atrial fibrillation: Secondary | ICD-10-CM | POA: Diagnosis not present

## 2015-08-16 DIAGNOSIS — N183 Chronic kidney disease, stage 3 (moderate): Secondary | ICD-10-CM | POA: Diagnosis not present

## 2015-08-16 DIAGNOSIS — Z6835 Body mass index (BMI) 35.0-35.9, adult: Secondary | ICD-10-CM | POA: Diagnosis not present

## 2015-08-16 DIAGNOSIS — I2510 Atherosclerotic heart disease of native coronary artery without angina pectoris: Secondary | ICD-10-CM | POA: Diagnosis not present

## 2015-08-22 DIAGNOSIS — Z7901 Long term (current) use of anticoagulants: Secondary | ICD-10-CM | POA: Diagnosis not present

## 2015-08-22 DIAGNOSIS — I480 Paroxysmal atrial fibrillation: Secondary | ICD-10-CM | POA: Diagnosis not present

## 2015-12-19 DIAGNOSIS — Z6835 Body mass index (BMI) 35.0-35.9, adult: Secondary | ICD-10-CM | POA: Diagnosis not present

## 2015-12-19 DIAGNOSIS — I480 Paroxysmal atrial fibrillation: Secondary | ICD-10-CM | POA: Diagnosis not present

## 2015-12-19 DIAGNOSIS — N183 Chronic kidney disease, stage 3 (moderate): Secondary | ICD-10-CM | POA: Diagnosis not present

## 2015-12-19 DIAGNOSIS — I10 Essential (primary) hypertension: Secondary | ICD-10-CM | POA: Diagnosis not present

## 2015-12-19 DIAGNOSIS — J069 Acute upper respiratory infection, unspecified: Secondary | ICD-10-CM | POA: Diagnosis not present

## 2015-12-19 DIAGNOSIS — E1151 Type 2 diabetes mellitus with diabetic peripheral angiopathy without gangrene: Secondary | ICD-10-CM | POA: Diagnosis not present

## 2015-12-19 DIAGNOSIS — Z1389 Encounter for screening for other disorder: Secondary | ICD-10-CM | POA: Diagnosis not present

## 2015-12-19 DIAGNOSIS — I2510 Atherosclerotic heart disease of native coronary artery without angina pectoris: Secondary | ICD-10-CM | POA: Diagnosis not present

## 2016-01-06 DIAGNOSIS — Z1231 Encounter for screening mammogram for malignant neoplasm of breast: Secondary | ICD-10-CM | POA: Diagnosis not present

## 2016-01-21 ENCOUNTER — Telehealth: Payer: Self-pay | Admitting: Internal Medicine

## 2016-01-21 NOTE — Telephone Encounter (Signed)
°*  STAT* If patient is at the pharmacy, call can be transferred to refill team.   1. Which medications need to be refilled? (please list name of each medication and dose if known) Flecainide 150 mg-new pharmacy 2. Which pharmacy/location (including street and city if local pharmacy) is medication to be sent to?[facility]-[phone]  3. Do they need a 30 day or 90 day supply? Refer to former prescription

## 2016-01-22 NOTE — Telephone Encounter (Signed)
I have made several attempts to reach the patient to let her know that she has refills at [facility] and that she can have these transferred to [facility]. Will try again later.

## 2016-01-22 NOTE — Telephone Encounter (Signed)
Attempted to reach patient, but was unsuccessful.

## 2016-04-07 DIAGNOSIS — M1711 Unilateral primary osteoarthritis, right knee: Secondary | ICD-10-CM | POA: Diagnosis not present

## 2016-04-20 DIAGNOSIS — N183 Chronic kidney disease, stage 3 (moderate): Secondary | ICD-10-CM | POA: Diagnosis not present

## 2016-04-20 DIAGNOSIS — E1151 Type 2 diabetes mellitus with diabetic peripheral angiopathy without gangrene: Secondary | ICD-10-CM | POA: Diagnosis not present

## 2016-04-20 DIAGNOSIS — I10 Essential (primary) hypertension: Secondary | ICD-10-CM | POA: Diagnosis not present

## 2016-04-20 DIAGNOSIS — M1990 Unspecified osteoarthritis, unspecified site: Secondary | ICD-10-CM | POA: Diagnosis not present

## 2016-04-20 DIAGNOSIS — I2510 Atherosclerotic heart disease of native coronary artery without angina pectoris: Secondary | ICD-10-CM | POA: Diagnosis not present

## 2016-04-20 DIAGNOSIS — Z6835 Body mass index (BMI) 35.0-35.9, adult: Secondary | ICD-10-CM | POA: Diagnosis not present

## 2016-04-20 DIAGNOSIS — I480 Paroxysmal atrial fibrillation: Secondary | ICD-10-CM | POA: Diagnosis not present

## 2016-05-04 ENCOUNTER — Other Ambulatory Visit: Payer: Self-pay | Admitting: Cardiovascular Disease

## 2016-05-18 DIAGNOSIS — M1711 Unilateral primary osteoarthritis, right knee: Secondary | ICD-10-CM | POA: Diagnosis not present

## 2016-05-22 DIAGNOSIS — I7090 Unspecified atherosclerosis: Secondary | ICD-10-CM | POA: Diagnosis not present

## 2016-05-22 DIAGNOSIS — H35032 Hypertensive retinopathy, left eye: Secondary | ICD-10-CM | POA: Diagnosis not present

## 2016-05-22 DIAGNOSIS — E119 Type 2 diabetes mellitus without complications: Secondary | ICD-10-CM | POA: Diagnosis not present

## 2016-05-22 DIAGNOSIS — H40013 Open angle with borderline findings, low risk, bilateral: Secondary | ICD-10-CM | POA: Diagnosis not present

## 2016-05-22 DIAGNOSIS — H35031 Hypertensive retinopathy, right eye: Secondary | ICD-10-CM | POA: Diagnosis not present

## 2016-06-22 ENCOUNTER — Ambulatory Visit: Payer: Medicare Other | Admitting: Cardiovascular Disease

## 2016-07-28 ENCOUNTER — Other Ambulatory Visit: Payer: Self-pay | Admitting: Internal Medicine

## 2016-08-10 ENCOUNTER — Other Ambulatory Visit: Payer: Self-pay | Admitting: Cardiovascular Disease

## 2016-08-11 ENCOUNTER — Encounter: Payer: Self-pay | Admitting: Cardiovascular Disease

## 2016-08-18 DIAGNOSIS — E784 Other hyperlipidemia: Secondary | ICD-10-CM | POA: Diagnosis not present

## 2016-08-18 DIAGNOSIS — E1151 Type 2 diabetes mellitus with diabetic peripheral angiopathy without gangrene: Secondary | ICD-10-CM | POA: Diagnosis not present

## 2016-08-18 DIAGNOSIS — I10 Essential (primary) hypertension: Secondary | ICD-10-CM | POA: Diagnosis not present

## 2016-08-18 DIAGNOSIS — R8299 Other abnormal findings in urine: Secondary | ICD-10-CM | POA: Diagnosis not present

## 2016-08-18 DIAGNOSIS — N390 Urinary tract infection, site not specified: Secondary | ICD-10-CM | POA: Diagnosis not present

## 2016-08-25 ENCOUNTER — Ambulatory Visit (INDEPENDENT_AMBULATORY_CARE_PROVIDER_SITE_OTHER): Payer: Medicare Other | Admitting: Cardiovascular Disease

## 2016-08-25 ENCOUNTER — Encounter: Payer: Self-pay | Admitting: Cardiovascular Disease

## 2016-08-25 VITALS — BP 112/70 | HR 55 | Ht 65.0 in | Wt 210.2 lb

## 2016-08-25 DIAGNOSIS — I480 Paroxysmal atrial fibrillation: Secondary | ICD-10-CM | POA: Diagnosis not present

## 2016-08-25 DIAGNOSIS — I10 Essential (primary) hypertension: Secondary | ICD-10-CM | POA: Diagnosis not present

## 2016-08-25 DIAGNOSIS — M1990 Unspecified osteoarthritis, unspecified site: Secondary | ICD-10-CM | POA: Diagnosis not present

## 2016-08-25 DIAGNOSIS — Z6834 Body mass index (BMI) 34.0-34.9, adult: Secondary | ICD-10-CM | POA: Diagnosis not present

## 2016-08-25 DIAGNOSIS — E784 Other hyperlipidemia: Secondary | ICD-10-CM | POA: Diagnosis not present

## 2016-08-25 DIAGNOSIS — I2510 Atherosclerotic heart disease of native coronary artery without angina pectoris: Secondary | ICD-10-CM | POA: Diagnosis not present

## 2016-08-25 DIAGNOSIS — N183 Chronic kidney disease, stage 3 (moderate): Secondary | ICD-10-CM | POA: Diagnosis not present

## 2016-08-25 DIAGNOSIS — E785 Hyperlipidemia, unspecified: Secondary | ICD-10-CM

## 2016-08-25 DIAGNOSIS — Z0000 Encounter for general adult medical examination without abnormal findings: Secondary | ICD-10-CM | POA: Diagnosis not present

## 2016-08-25 DIAGNOSIS — Z23 Encounter for immunization: Secondary | ICD-10-CM | POA: Diagnosis not present

## 2016-08-25 DIAGNOSIS — E1151 Type 2 diabetes mellitus with diabetic peripheral angiopathy without gangrene: Secondary | ICD-10-CM | POA: Diagnosis not present

## 2016-08-25 DIAGNOSIS — G629 Polyneuropathy, unspecified: Secondary | ICD-10-CM | POA: Diagnosis not present

## 2016-08-25 DIAGNOSIS — E669 Obesity, unspecified: Secondary | ICD-10-CM | POA: Diagnosis not present

## 2016-08-25 MED ORDER — METOPROLOL SUCCINATE ER 200 MG PO TB24: 200.0000 mg | ORAL_TABLET | Freq: Every day | ORAL | 3 refills | Status: DC

## 2016-08-25 NOTE — Progress Notes (Signed)
Peggy Chen Date of Birth  May 18, 1951       [facility] [address] [place], [place], [place]  [address]   [place], [place]  [address] [phone]     [phone]   Fax  [phone]    Fax [phone]  Problem List: 1. Intermittent atrial fibrillation 2. Diabetes mellitus 3. Hyperlipidemia 4. Hypertension  History of Present Illness:  65 year old female with a history of intermittent atrial fibrillation. She also has a history of diabetes mellitus, hyperlipidemia, and hypertension. She still occasionally has episodes of A-Fib that last 1-3 days.  They can be as short as 3 hours.  She takes propranolol which seems to help.  She is on coumadin.   She does not exercise much at all.  She actually feels better when she is active.    February 26, 2014:  Still having some atrial fib.  On coumadin.    May 16, 2014: We attempted cardioversion [place] on Apr 17, 2014.  It was not successful. Since that time, we have started her on flecainide. [name] converted to sinus rhythm with the flecainide.  Sept. 19, 2017:  Has had many life changes Lost her husband Dec. 2015 Daughter is in jail for heroin   She is maintaining NSR Is on flecainide.  Has occasional afib episodes if she misses a dose.     Current Outpatient Prescriptions on File Prior to Visit  Medication Sig Dispense Refill  . aspirin 81 MG tablet Take by mouth.    . citalopram (CELEXA) 10 MG tablet Take 10 mg by mouth daily.    . cyanocobalamin 1000 MCG tablet Take 100 mcg by mouth daily.    . flecainide (TAMBOCOR) 150 MG tablet Take 1 tablet (150 mg total) by mouth 2 (two) times daily. [id number] tablet 1  . folic acid (FOLVITE) 1 MG tablet Take 1 mg by mouth daily.     Marland Kitchen HYDROcodone-acetaminophen (NORCO/VICODIN) 5-325 MG per tablet Take 1 tablet by mouth every 6 (six) hours as needed for moderate pain.    . metoprolol (TOPROL-XL) 200 MG 24 hr tablet TAKE  ONE TABLET BY MOUTH ONCE DAILY 90 tablet 0  . omega-3 acid ethyl esters (LOVAZA) 1 G capsule Take by mouth.    . warfarin (COUMADIN) 5 MG tablet Take 5-7.5 mg by mouth daily. Take 7.5 mg by mouth on Monday.  Take 5 mg by mouth on all other days.    Marland Kitchen zolpidem (AMBIEN) 5 MG tablet Take 2.5 mg by mouth at bedtime.      No current facility-administered medications on file prior to visit.     Allergies  Allergen Reactions  . Penicillins Hives    Past Medical History:  Diagnosis Date  . AF (atrial fibrillation) ([place])   . Back pain   . Diabetes mellitus   . Hyperlipidemia   . Hypertension     Past Surgical History:  Procedure Laterality Date  . BREAST CYST REMOVAL     RIGHT BREAST  . CARDIOVERSION N/A 04/17/2014   Procedure: CARDIOVERSION;  Surgeon: Thayer Headings, MD;  Location: [facility];  Service: Cardiovascular;  Laterality: N/A;  . COLON SURGERY     May 2000  . COLONOSCOPY    . TONSILLECTOMY    . US ECHOCARDIOGRAPHY  12/30/2009   EF 55-60%    History  Smoking Status  . Former Smoker  . Quit date: 06/22/1986  Smokeless Tobacco  .  Never Used    History  Alcohol Use  . Yes    Comment: occasional    Family History  Problem Relation Age of Onset  . COPD Mother   . Heart attack Father     Reviw of Systems:  Reviewed in the HPI.  All other systems are negative.  Physical Exam: Blood pressure 112/70, pulse (!) 55, height 5\' 5"  (1.651 m), weight 210 lb 3.2 oz (95.3 kg). General: Well developed, well nourished, in no acute distress.  Head: Normocephalic, atraumatic, sclera non-icteric, mucus membranes are moist,   Neck: Supple. Carotids are 2 + without bruits. No JVD , thick neck  Lungs: Clear   Heart: RR.    Abdomen: Soft, non-tender, non-distended with normal bowel sounds. Msk:  Strength and tone are normal  Extremities: No clubbing or cyanosis. No edema.  Distal pedal pulses are 2+ and equal   Neuro: CN II - XII intact.  Alert and oriented X 3.    Psych:  Normal   ECG: Sept. 19, 2017 Sinus brady with 1st degree AV block  NS T wave abn.    Assessment / Plan:   1. Intermittent atrial fibrillation  Is maintaining NSR , continue flecainide, metoprolol, Eliquis   2. Diabetes mellitus  3. Hyperlipidemia Managed by her medical   4. Hypertension Well controlled    Mertie Moores, MD  08/25/2016 9:29 AM    [place],  [place], [place]  [address] Pager [phone] Phone: [phone]; Fax: [phone]

## 2016-08-25 NOTE — Patient Instructions (Signed)
Medication Instructions:  Your physician recommends that you continue on your current medications as directed. Please refer to the Current Medication list given to you today.   Labwork: None Ordered   Testing/Procedures: None Ordered   Follow-Up: Your physician wants you to follow-up in: 1 year with Dr. Nahser.  You will receive a reminder letter in the mail two months in advance. If you don't receive a letter, please call our office to schedule the follow-up appointment.   If you need a refill on your cardiac medications before your next appointment, please call your pharmacy.   Thank you for choosing [facility]! Jolynda Townley, RN [phone]    

## 2016-09-16 DIAGNOSIS — I10 Essential (primary) hypertension: Secondary | ICD-10-CM | POA: Diagnosis not present

## 2016-09-16 DIAGNOSIS — E1151 Type 2 diabetes mellitus with diabetic peripheral angiopathy without gangrene: Secondary | ICD-10-CM | POA: Diagnosis not present

## 2016-09-16 DIAGNOSIS — Z7901 Long term (current) use of anticoagulants: Secondary | ICD-10-CM | POA: Diagnosis not present

## 2016-09-16 DIAGNOSIS — I480 Paroxysmal atrial fibrillation: Secondary | ICD-10-CM | POA: Diagnosis not present

## 2016-09-22 DIAGNOSIS — Z7901 Long term (current) use of anticoagulants: Secondary | ICD-10-CM | POA: Diagnosis not present

## 2016-09-22 DIAGNOSIS — I480 Paroxysmal atrial fibrillation: Secondary | ICD-10-CM | POA: Diagnosis not present

## 2016-09-25 DIAGNOSIS — I480 Paroxysmal atrial fibrillation: Secondary | ICD-10-CM | POA: Diagnosis not present

## 2016-09-25 DIAGNOSIS — Z7901 Long term (current) use of anticoagulants: Secondary | ICD-10-CM | POA: Diagnosis not present

## 2016-09-28 DIAGNOSIS — Z124 Encounter for screening for malignant neoplasm of cervix: Secondary | ICD-10-CM | POA: Diagnosis not present

## 2016-09-28 DIAGNOSIS — Z01419 Encounter for gynecological examination (general) (routine) without abnormal findings: Secondary | ICD-10-CM | POA: Diagnosis not present

## 2016-09-30 DIAGNOSIS — I480 Paroxysmal atrial fibrillation: Secondary | ICD-10-CM | POA: Diagnosis not present

## 2016-09-30 DIAGNOSIS — Z7901 Long term (current) use of anticoagulants: Secondary | ICD-10-CM | POA: Diagnosis not present

## 2016-10-01 DIAGNOSIS — M1711 Unilateral primary osteoarthritis, right knee: Secondary | ICD-10-CM | POA: Diagnosis not present

## 2016-10-12 DIAGNOSIS — M25562 Pain in left knee: Secondary | ICD-10-CM | POA: Diagnosis not present

## 2016-10-15 DIAGNOSIS — Z7901 Long term (current) use of anticoagulants: Secondary | ICD-10-CM | POA: Diagnosis not present

## 2016-10-15 DIAGNOSIS — I480 Paroxysmal atrial fibrillation: Secondary | ICD-10-CM | POA: Diagnosis not present

## 2016-11-10 DIAGNOSIS — I480 Paroxysmal atrial fibrillation: Secondary | ICD-10-CM | POA: Diagnosis not present

## 2016-11-10 DIAGNOSIS — Z7901 Long term (current) use of anticoagulants: Secondary | ICD-10-CM | POA: Diagnosis not present

## 2016-11-10 DIAGNOSIS — H05231 Hemorrhage of right orbit: Secondary | ICD-10-CM | POA: Diagnosis not present

## 2016-11-10 DIAGNOSIS — H1131 Conjunctival hemorrhage, right eye: Secondary | ICD-10-CM | POA: Diagnosis not present

## 2016-11-25 DIAGNOSIS — I480 Paroxysmal atrial fibrillation: Secondary | ICD-10-CM | POA: Diagnosis not present

## 2016-11-25 DIAGNOSIS — Z7901 Long term (current) use of anticoagulants: Secondary | ICD-10-CM | POA: Diagnosis not present

## 2016-12-30 DIAGNOSIS — I480 Paroxysmal atrial fibrillation: Secondary | ICD-10-CM | POA: Diagnosis not present

## 2016-12-30 DIAGNOSIS — Z7901 Long term (current) use of anticoagulants: Secondary | ICD-10-CM | POA: Diagnosis not present

## 2017-01-18 DIAGNOSIS — I2510 Atherosclerotic heart disease of native coronary artery without angina pectoris: Secondary | ICD-10-CM | POA: Diagnosis not present

## 2017-01-18 DIAGNOSIS — I480 Paroxysmal atrial fibrillation: Secondary | ICD-10-CM | POA: Diagnosis not present

## 2017-01-18 DIAGNOSIS — I10 Essential (primary) hypertension: Secondary | ICD-10-CM | POA: Diagnosis not present

## 2017-01-18 DIAGNOSIS — Z1389 Encounter for screening for other disorder: Secondary | ICD-10-CM | POA: Diagnosis not present

## 2017-01-18 DIAGNOSIS — E669 Obesity, unspecified: Secondary | ICD-10-CM | POA: Diagnosis not present

## 2017-01-18 DIAGNOSIS — Z7901 Long term (current) use of anticoagulants: Secondary | ICD-10-CM | POA: Diagnosis not present

## 2017-01-18 DIAGNOSIS — F439 Reaction to severe stress, unspecified: Secondary | ICD-10-CM | POA: Diagnosis not present

## 2017-01-18 DIAGNOSIS — Z6835 Body mass index (BMI) 35.0-35.9, adult: Secondary | ICD-10-CM | POA: Diagnosis not present

## 2017-01-18 DIAGNOSIS — E1151 Type 2 diabetes mellitus with diabetic peripheral angiopathy without gangrene: Secondary | ICD-10-CM | POA: Diagnosis not present

## 2017-01-18 DIAGNOSIS — N183 Chronic kidney disease, stage 3 (moderate): Secondary | ICD-10-CM | POA: Diagnosis not present

## 2017-01-18 DIAGNOSIS — G629 Polyneuropathy, unspecified: Secondary | ICD-10-CM | POA: Diagnosis not present

## 2017-02-02 DIAGNOSIS — I480 Paroxysmal atrial fibrillation: Secondary | ICD-10-CM | POA: Diagnosis not present

## 2017-02-02 DIAGNOSIS — Z7901 Long term (current) use of anticoagulants: Secondary | ICD-10-CM | POA: Diagnosis not present

## 2017-04-01 DIAGNOSIS — J309 Allergic rhinitis, unspecified: Secondary | ICD-10-CM | POA: Diagnosis not present

## 2017-04-01 DIAGNOSIS — I10 Essential (primary) hypertension: Secondary | ICD-10-CM | POA: Diagnosis not present

## 2017-04-01 DIAGNOSIS — J0100 Acute maxillary sinusitis, unspecified: Secondary | ICD-10-CM | POA: Diagnosis not present

## 2017-04-01 DIAGNOSIS — R05 Cough: Secondary | ICD-10-CM | POA: Diagnosis not present

## 2017-04-01 DIAGNOSIS — E1151 Type 2 diabetes mellitus with diabetic peripheral angiopathy without gangrene: Secondary | ICD-10-CM | POA: Diagnosis not present

## 2017-04-01 DIAGNOSIS — Z6834 Body mass index (BMI) 34.0-34.9, adult: Secondary | ICD-10-CM | POA: Diagnosis not present

## 2017-04-01 DIAGNOSIS — J209 Acute bronchitis, unspecified: Secondary | ICD-10-CM | POA: Diagnosis not present

## 2017-04-01 DIAGNOSIS — Z7901 Long term (current) use of anticoagulants: Secondary | ICD-10-CM | POA: Diagnosis not present

## 2017-04-06 DIAGNOSIS — I480 Paroxysmal atrial fibrillation: Secondary | ICD-10-CM | POA: Diagnosis not present

## 2017-04-06 DIAGNOSIS — Z7901 Long term (current) use of anticoagulants: Secondary | ICD-10-CM | POA: Diagnosis not present

## 2017-04-06 DIAGNOSIS — J209 Acute bronchitis, unspecified: Secondary | ICD-10-CM | POA: Diagnosis not present

## 2017-04-06 DIAGNOSIS — I10 Essential (primary) hypertension: Secondary | ICD-10-CM | POA: Diagnosis not present

## 2017-04-06 DIAGNOSIS — Z6841 Body Mass Index (BMI) 40.0 and over, adult: Secondary | ICD-10-CM | POA: Diagnosis not present

## 2017-04-06 DIAGNOSIS — E1151 Type 2 diabetes mellitus with diabetic peripheral angiopathy without gangrene: Secondary | ICD-10-CM | POA: Diagnosis not present

## 2017-04-13 DIAGNOSIS — F439 Reaction to severe stress, unspecified: Secondary | ICD-10-CM | POA: Diagnosis not present

## 2017-04-13 DIAGNOSIS — J209 Acute bronchitis, unspecified: Secondary | ICD-10-CM | POA: Diagnosis not present

## 2017-04-13 DIAGNOSIS — E1151 Type 2 diabetes mellitus with diabetic peripheral angiopathy without gangrene: Secondary | ICD-10-CM | POA: Diagnosis not present

## 2017-04-13 DIAGNOSIS — M79671 Pain in right foot: Secondary | ICD-10-CM | POA: Diagnosis not present

## 2017-04-13 DIAGNOSIS — Z6834 Body mass index (BMI) 34.0-34.9, adult: Secondary | ICD-10-CM | POA: Diagnosis not present

## 2017-04-26 DIAGNOSIS — F439 Reaction to severe stress, unspecified: Secondary | ICD-10-CM | POA: Diagnosis not present

## 2017-05-31 DIAGNOSIS — F438 Other reactions to severe stress: Secondary | ICD-10-CM | POA: Diagnosis not present

## 2017-06-21 DIAGNOSIS — I480 Paroxysmal atrial fibrillation: Secondary | ICD-10-CM | POA: Diagnosis not present

## 2017-06-21 DIAGNOSIS — I10 Essential (primary) hypertension: Secondary | ICD-10-CM | POA: Diagnosis not present

## 2017-06-21 DIAGNOSIS — M5431 Sciatica, right side: Secondary | ICD-10-CM | POA: Diagnosis not present

## 2017-06-21 DIAGNOSIS — G629 Polyneuropathy, unspecified: Secondary | ICD-10-CM | POA: Diagnosis not present

## 2017-06-21 DIAGNOSIS — Z7901 Long term (current) use of anticoagulants: Secondary | ICD-10-CM | POA: Diagnosis not present

## 2017-06-21 DIAGNOSIS — E1151 Type 2 diabetes mellitus with diabetic peripheral angiopathy without gangrene: Secondary | ICD-10-CM | POA: Diagnosis not present

## 2017-06-21 DIAGNOSIS — Z6835 Body mass index (BMI) 35.0-35.9, adult: Secondary | ICD-10-CM | POA: Diagnosis not present

## 2017-07-14 DIAGNOSIS — M5136 Other intervertebral disc degeneration, lumbar region: Secondary | ICD-10-CM | POA: Diagnosis not present

## 2017-07-14 DIAGNOSIS — M9903 Segmental and somatic dysfunction of lumbar region: Secondary | ICD-10-CM | POA: Diagnosis not present

## 2017-07-14 DIAGNOSIS — M9904 Segmental and somatic dysfunction of sacral region: Secondary | ICD-10-CM | POA: Diagnosis not present

## 2017-07-14 DIAGNOSIS — M9905 Segmental and somatic dysfunction of pelvic region: Secondary | ICD-10-CM | POA: Diagnosis not present

## 2017-07-15 DIAGNOSIS — M9905 Segmental and somatic dysfunction of pelvic region: Secondary | ICD-10-CM | POA: Diagnosis not present

## 2017-07-15 DIAGNOSIS — M9903 Segmental and somatic dysfunction of lumbar region: Secondary | ICD-10-CM | POA: Diagnosis not present

## 2017-07-15 DIAGNOSIS — M5136 Other intervertebral disc degeneration, lumbar region: Secondary | ICD-10-CM | POA: Diagnosis not present

## 2017-07-15 DIAGNOSIS — M9904 Segmental and somatic dysfunction of sacral region: Secondary | ICD-10-CM | POA: Diagnosis not present

## 2017-07-21 DIAGNOSIS — I480 Paroxysmal atrial fibrillation: Secondary | ICD-10-CM | POA: Diagnosis not present

## 2017-07-21 DIAGNOSIS — Z7901 Long term (current) use of anticoagulants: Secondary | ICD-10-CM | POA: Diagnosis not present

## 2017-08-11 DIAGNOSIS — M2241 Chondromalacia patellae, right knee: Secondary | ICD-10-CM | POA: Diagnosis not present

## 2017-08-11 DIAGNOSIS — M1711 Unilateral primary osteoarthritis, right knee: Secondary | ICD-10-CM | POA: Diagnosis not present

## 2017-08-12 ENCOUNTER — Other Ambulatory Visit: Payer: Self-pay | Admitting: Internal Medicine

## 2017-08-13 ENCOUNTER — Other Ambulatory Visit: Payer: Self-pay

## 2017-08-18 DIAGNOSIS — M25561 Pain in right knee: Secondary | ICD-10-CM | POA: Diagnosis not present

## 2017-08-18 DIAGNOSIS — G8929 Other chronic pain: Secondary | ICD-10-CM | POA: Diagnosis not present

## 2017-08-23 DIAGNOSIS — M79609 Pain in unspecified limb: Secondary | ICD-10-CM | POA: Diagnosis not present

## 2017-08-23 DIAGNOSIS — M4310 Spondylolisthesis, site unspecified: Secondary | ICD-10-CM | POA: Diagnosis not present

## 2017-08-23 DIAGNOSIS — M5136 Other intervertebral disc degeneration, lumbar region: Secondary | ICD-10-CM | POA: Diagnosis not present

## 2017-09-01 DIAGNOSIS — H25013 Cortical age-related cataract, bilateral: Secondary | ICD-10-CM | POA: Diagnosis not present

## 2017-09-01 DIAGNOSIS — H35033 Hypertensive retinopathy, bilateral: Secondary | ICD-10-CM | POA: Diagnosis not present

## 2017-09-01 DIAGNOSIS — H2513 Age-related nuclear cataract, bilateral: Secondary | ICD-10-CM | POA: Diagnosis not present

## 2017-09-01 DIAGNOSIS — E119 Type 2 diabetes mellitus without complications: Secondary | ICD-10-CM | POA: Diagnosis not present

## 2017-09-01 DIAGNOSIS — H40013 Open angle with borderline findings, low risk, bilateral: Secondary | ICD-10-CM | POA: Diagnosis not present

## 2017-09-06 DIAGNOSIS — D225 Melanocytic nevi of trunk: Secondary | ICD-10-CM | POA: Diagnosis not present

## 2017-09-06 DIAGNOSIS — L918 Other hypertrophic disorders of the skin: Secondary | ICD-10-CM | POA: Diagnosis not present

## 2017-09-06 DIAGNOSIS — Z85828 Personal history of other malignant neoplasm of skin: Secondary | ICD-10-CM | POA: Diagnosis not present

## 2017-09-06 DIAGNOSIS — L814 Other melanin hyperpigmentation: Secondary | ICD-10-CM | POA: Diagnosis not present

## 2017-09-06 DIAGNOSIS — L821 Other seborrheic keratosis: Secondary | ICD-10-CM | POA: Diagnosis not present

## 2017-09-07 DIAGNOSIS — Z6835 Body mass index (BMI) 35.0-35.9, adult: Secondary | ICD-10-CM | POA: Diagnosis not present

## 2017-09-07 DIAGNOSIS — Z79899 Other long term (current) drug therapy: Secondary | ICD-10-CM | POA: Diagnosis not present

## 2017-09-07 DIAGNOSIS — M545 Low back pain: Secondary | ICD-10-CM | POA: Diagnosis not present

## 2017-09-20 DIAGNOSIS — I10 Essential (primary) hypertension: Secondary | ICD-10-CM | POA: Diagnosis not present

## 2017-09-20 DIAGNOSIS — E7849 Other hyperlipidemia: Secondary | ICD-10-CM | POA: Diagnosis not present

## 2017-09-20 DIAGNOSIS — Z0000 Encounter for general adult medical examination without abnormal findings: Secondary | ICD-10-CM | POA: Diagnosis not present

## 2017-09-20 DIAGNOSIS — R82998 Other abnormal findings in urine: Secondary | ICD-10-CM | POA: Diagnosis not present

## 2017-09-20 DIAGNOSIS — E1151 Type 2 diabetes mellitus with diabetic peripheral angiopathy without gangrene: Secondary | ICD-10-CM | POA: Diagnosis not present

## 2017-09-27 DIAGNOSIS — I2510 Atherosclerotic heart disease of native coronary artery without angina pectoris: Secondary | ICD-10-CM | POA: Diagnosis not present

## 2017-09-27 DIAGNOSIS — Z7901 Long term (current) use of anticoagulants: Secondary | ICD-10-CM | POA: Diagnosis not present

## 2017-09-27 DIAGNOSIS — G6289 Other specified polyneuropathies: Secondary | ICD-10-CM | POA: Diagnosis not present

## 2017-09-27 DIAGNOSIS — N183 Chronic kidney disease, stage 3 (moderate): Secondary | ICD-10-CM | POA: Diagnosis not present

## 2017-09-27 DIAGNOSIS — I10 Essential (primary) hypertension: Secondary | ICD-10-CM | POA: Diagnosis not present

## 2017-09-27 DIAGNOSIS — E7849 Other hyperlipidemia: Secondary | ICD-10-CM | POA: Diagnosis not present

## 2017-09-27 DIAGNOSIS — I480 Paroxysmal atrial fibrillation: Secondary | ICD-10-CM | POA: Diagnosis not present

## 2017-09-27 DIAGNOSIS — Z1389 Encounter for screening for other disorder: Secondary | ICD-10-CM | POA: Diagnosis not present

## 2017-09-27 DIAGNOSIS — Z0000 Encounter for general adult medical examination without abnormal findings: Secondary | ICD-10-CM | POA: Diagnosis not present

## 2017-09-27 DIAGNOSIS — E1151 Type 2 diabetes mellitus with diabetic peripheral angiopathy without gangrene: Secondary | ICD-10-CM | POA: Diagnosis not present

## 2017-09-27 DIAGNOSIS — M1990 Unspecified osteoarthritis, unspecified site: Secondary | ICD-10-CM | POA: Diagnosis not present

## 2017-09-27 DIAGNOSIS — E668 Other obesity: Secondary | ICD-10-CM | POA: Diagnosis not present

## 2017-10-21 ENCOUNTER — Encounter: Payer: Self-pay | Admitting: Physician Assistant

## 2017-10-21 ENCOUNTER — Ambulatory Visit (INDEPENDENT_AMBULATORY_CARE_PROVIDER_SITE_OTHER): Payer: Medicare Other | Admitting: Physician Assistant

## 2017-10-21 ENCOUNTER — Encounter (INDEPENDENT_AMBULATORY_CARE_PROVIDER_SITE_OTHER): Payer: Self-pay

## 2017-10-21 VITALS — BP 112/68 | HR 60 | Ht 65.0 in | Wt 210.0 lb

## 2017-10-21 DIAGNOSIS — E782 Mixed hyperlipidemia: Secondary | ICD-10-CM | POA: Diagnosis not present

## 2017-10-21 DIAGNOSIS — I10 Essential (primary) hypertension: Secondary | ICD-10-CM | POA: Diagnosis not present

## 2017-10-21 DIAGNOSIS — E119 Type 2 diabetes mellitus without complications: Secondary | ICD-10-CM

## 2017-10-21 DIAGNOSIS — I480 Paroxysmal atrial fibrillation: Secondary | ICD-10-CM | POA: Diagnosis not present

## 2017-10-21 NOTE — Patient Instructions (Signed)
Medication Instructions:  Your physician recommends that you continue on your current medications as directed. Please refer to the Current Medication list given to you today.   Labwork: NONE ORDERED TODAY  Testing/Procedures: NONE ORDERED TODAY  Follow-Up: Your physician wants you to follow-up in: 1 YEAR WITH DR. Acie Fredrickson You will receive a reminder letter in the mail two months in advance. If you don't receive a letter, please call our office to schedule the follow-up appointment.   Any Other Special Instructions Will Be Listed Below (If Applicable).     If you need a refill on your cardiac medications before your next appointment, please call your pharmacy.

## 2017-10-21 NOTE — Progress Notes (Signed)
Cardiology Office Note    Date:  10/21/2017   ID:  Peggy Chen, Peggy Chen 1950/12/29, MRN [id number]  PCP:  [name], MD  Cardiologist: Dr. Acie Fredrickson  Chief Complaint: 12  Months follow up  History of Present Illness:   Peggy Chen is a 66 y.o. female with hx of HTN, HLD, DM and PAF presents for follow up.   Echo 03/2014 normal LVEF of 50-55%. Normal Myoview 05/2014.  Last seen by Dr. Acie Fredrickson 08/25/16. Has occasional afib episodes if she misses a dose of flecainide.   Here today for follow up. 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 No regular exercise. Intermittent palpitations. Occurs once every month or so. Self resolution in few minutes. Mostly happen when she stress out. The patient denies nausea, vomiting, fever, chest pain, shortness of breath, orthopnea, PND, dizziness, syncope, cough, congestion, abdominal pain, hematochezia, melena, lower extremity edema.    Past Medical History:  Diagnosis Date  . AF (atrial fibrillation) ([place])   . Back pain   . Diabetes mellitus   . Hyperlipidemia   . Hypertension     Past Surgical History:  Procedure Laterality Date  . BREAST CYST REMOVAL     RIGHT BREAST  . CARDIOVERSION N/A 04/17/2014   Procedure: CARDIOVERSION;  Surgeon: Thayer Headings, MD;  Location: [facility];  Service: Cardiovascular;  Laterality: N/A;  . COLON SURGERY     May 2000  . COLONOSCOPY    . TONSILLECTOMY    . US ECHOCARDIOGRAPHY  12/30/2009   EF 55-60%    Current Medications: Prior to Admission medications   Medication Sig Start Date End Date Taking? Authorizing Provider  amLODipine (NORVASC) 5 MG tablet Take 5 mg by mouth daily. 08/11/16   [provider]  aspirin 81 MG tablet Take by mouth. 01/05/11   [provider]  citalopram (CELEXA) 10 MG tablet Take 10 mg by mouth daily.    [provider]  cyanocobalamin 1000 MCG tablet Take 100 mcg by mouth daily.    [provider]  ELIQUIS 5 MG TABS tablet  Take 5 mg by mouth 2 (two) times daily. 08/16/16   [provider]  flecainide (TAMBOCOR) 150 MG tablet Take 1 tablet (150 mg total) by mouth 2 (two) times daily. 06/17/15   Nahser, Wonda Cheng, MD  flecainide (TAMBOCOR) 150 MG tablet TAKE ONE TABLET BY MOUTH TWICE DAILY 08/12/17   Allred, Jeneen Rinks, MD  folic acid (FOLVITE) 1 MG tablet Take 1 mg by mouth daily.     [provider]  HYDROcodone-acetaminophen (NORCO/VICODIN) 5-325 MG per tablet Take 1 tablet by mouth every 6 (six) hours as needed for moderate pain.    [provider]  JANUMET 50-1000 MG tablet Take 1 tablet by mouth 2 (two) times daily. 08/12/16   [provider]  metoprolol (TOPROL-XL) 200 MG 24 hr tablet Take 1 tablet (200 mg total) by mouth daily. 08/25/16   Nahser, Wonda Cheng, MD  omega-3 acid ethyl esters (LOVAZA) 1 G capsule Take by mouth. 01/05/11   [provider]  valsartan-hydrochlorothiazide (DIOVAN-HCT) 160-25 MG tablet Take 1 tablet by mouth daily. 06/21/16   [provider]  warfarin (COUMADIN) 5 MG tablet Take 5-7.5 mg by mouth daily. Take 7.5 mg by mouth on Monday.  Take 5 mg by mouth on all other days.    [provider]  zolpidem (AMBIEN) 5 MG tablet Take 2.5 mg by mouth at bedtime.     [provider]  Allergies:   Penicillins   Social History   Socioeconomic History  . Marital status: Married    Spouse name: None  . Number of children: None  . Years of education: None  . Highest education level: None  Social Needs  . Financial resource strain: None  . Food insecurity - worry: None  . Food insecurity - inability: None  . Transportation needs - medical: None  . Transportation needs - non-medical: None  Occupational History  . None  Tobacco Use  . Smoking status: Former Smoker    Last attempt to quit: 06/22/1986    Years since quitting: 31.3  . Smokeless tobacco: Never Used  Substance and Sexual Activity  . Alcohol use: Yes    Comment:  occasional  . Drug use: No  . Sexual activity: None  Other Topics Concern  . None  Social History Narrative  . None     Family History:  The patient's family history includes COPD in her mother; Heart attack in her father.   ROS:   Please see the history of present illness.    ROS All other systems reviewed and are negative.   PHYSICAL EXAM:   VS:  BP 112/68   Pulse 60   Ht 5\' 5"  (1.651 m)   Wt 210 lb (95.3 kg)   SpO2 97%   BMI 34.95 kg/m    GEN: Well nourished, well developed, in no acute distress  HEENT: normal  Neck: no JVD, carotid bruits, or masses Cardiac: RRR; no murmurs, rubs, or gallops,no edema  Respiratory:  clear to auscultation bilaterally, normal work of breathing GI: soft, nontender, nondistended, + BS MS: no deformity or atrophy  Skin: warm and dry, no rash Neuro:  Alert and Oriented x 3, Strength and sensation are intact Psych: euthymic mood, full affect  Wt Readings from Last 3 Encounters:  10/21/17 210 lb (95.3 kg)  08/25/16 210 lb 3.2 oz (95.3 kg)  06/17/15 215 lb (97.5 kg)      Studies/Labs Reviewed:   EKG:  EKG is ordered today.  The ekg ordered today demonstrates NSR at rate of 59 bpm. Chronic non specific TWI changes anteriorly.   Recent Labs: No results found for requested labs within last 8760 hours.   Lipid Panel No results found for: CHOL, TRIG, HDL, CHOLHDL, VLDL, LDLCALC, LDLDIRECT  Additional studies/ records that were reviewed today include:   As above    ASSESSMENT & PLAN:    1. PAF - Stable. Continue Toprol XL, Flecainide and warfarin. Last K check in 2015 per our record. She has recent lab work at PCP office. She will check with them. If not get, get BMET.   2. HTN - Stable and controlled on current medications  3. HLD - LDL 101. Not on statin. Per PCP  4. DM - Per PCP    Medication Adjustments/Labs and Tests Ordered: Current medicines are reviewed at length with the patient today.  Concerns regarding  medicines are outlined above.  Medication changes, Labs and Tests ordered today are listed in the Patient Instructions below. Patient Instructions  Medication Instructions:  Your physician recommends that you continue on your current medications as directed. Please refer to the Current Medication list given to you today.   Labwork: NONE ORDERED TODAY  Testing/Procedures: NONE ORDERED TODAY  Follow-Up: Your physician wants you to follow-up in: 1 YEAR WITH DR. Acie Fredrickson You will receive a reminder letter in the mail two months in advance. If you don't receive a  letter, please call our office to schedule the follow-up appointment.   Any Other Special Instructions Will Be Listed Below (If Applicable).     If you need a refill on your cardiac medications before your next appointment, please call your pharmacy.      Jarrett Soho, [place]  10/21/2017 3:36 PM    [facility] [address], [place], [place]  [address] Phone: [phone]; Fax: [phone]

## 2017-11-06 DIAGNOSIS — N281 Cyst of kidney, acquired: Secondary | ICD-10-CM

## 2017-11-06 HISTORY — DX: Cyst of kidney, acquired: N28.1

## 2017-11-08 ENCOUNTER — Other Ambulatory Visit: Payer: Self-pay | Admitting: Cardiovascular Disease

## 2017-11-23 DIAGNOSIS — R8290 Unspecified abnormal findings in urine: Secondary | ICD-10-CM | POA: Diagnosis not present

## 2017-11-23 DIAGNOSIS — Z7901 Long term (current) use of anticoagulants: Secondary | ICD-10-CM | POA: Diagnosis not present

## 2017-11-23 DIAGNOSIS — N390 Urinary tract infection, site not specified: Secondary | ICD-10-CM | POA: Diagnosis not present

## 2017-11-23 DIAGNOSIS — I480 Paroxysmal atrial fibrillation: Secondary | ICD-10-CM | POA: Diagnosis not present

## 2017-11-24 ENCOUNTER — Inpatient Hospital Stay (HOSPITAL_COMMUNITY)
Admission: EM | Admit: 2017-11-24 | Discharge: 2017-11-28 | DRG: 853 | Disposition: A | Discharge status: Home Health | Payer: Medicare Other | Attending: Internal Medicine | Admitting: Internal Medicine

## 2017-11-24 ENCOUNTER — Emergency Department (HOSPITAL_COMMUNITY): Payer: Medicare Other

## 2017-11-24 ENCOUNTER — Inpatient Hospital Stay (HOSPITAL_COMMUNITY): Payer: Medicare Other

## 2017-11-24 ENCOUNTER — Encounter (HOSPITAL_COMMUNITY): Payer: Self-pay | Admitting: Emergency Medicine

## 2017-11-24 DIAGNOSIS — N390 Urinary tract infection, site not specified: Secondary | ICD-10-CM | POA: Diagnosis not present

## 2017-11-24 DIAGNOSIS — S0003XA Contusion of scalp, initial encounter: Secondary | ICD-10-CM | POA: Diagnosis not present

## 2017-11-24 DIAGNOSIS — R296 Repeated falls: Secondary | ICD-10-CM | POA: Diagnosis not present

## 2017-11-24 DIAGNOSIS — K439 Ventral hernia without obstruction or gangrene: Secondary | ICD-10-CM | POA: Diagnosis present

## 2017-11-24 DIAGNOSIS — M25552 Pain in left hip: Secondary | ICD-10-CM | POA: Diagnosis not present

## 2017-11-24 DIAGNOSIS — Z7984 Long term (current) use of oral hypoglycemic drugs: Secondary | ICD-10-CM

## 2017-11-24 DIAGNOSIS — N368 Other specified disorders of urethra: Secondary | ICD-10-CM | POA: Diagnosis not present

## 2017-11-24 DIAGNOSIS — Z87891 Personal history of nicotine dependence: Secondary | ICD-10-CM

## 2017-11-24 DIAGNOSIS — Z23 Encounter for immunization: Secondary | ICD-10-CM | POA: Diagnosis not present

## 2017-11-24 DIAGNOSIS — E860 Dehydration: Secondary | ICD-10-CM | POA: Diagnosis present

## 2017-11-24 DIAGNOSIS — E876 Hypokalemia: Secondary | ICD-10-CM | POA: Diagnosis present

## 2017-11-24 DIAGNOSIS — Z880 Allergy status to penicillin: Secondary | ICD-10-CM | POA: Diagnosis not present

## 2017-11-24 DIAGNOSIS — N179 Acute kidney failure, unspecified: Secondary | ICD-10-CM

## 2017-11-24 DIAGNOSIS — S0990XA Unspecified injury of head, initial encounter: Secondary | ICD-10-CM | POA: Diagnosis not present

## 2017-11-24 DIAGNOSIS — N132 Hydronephrosis with renal and ureteral calculous obstruction: Secondary | ICD-10-CM | POA: Diagnosis not present

## 2017-11-24 DIAGNOSIS — I480 Paroxysmal atrial fibrillation: Secondary | ICD-10-CM

## 2017-11-24 DIAGNOSIS — N1330 Unspecified hydronephrosis: Secondary | ICD-10-CM | POA: Diagnosis not present

## 2017-11-24 DIAGNOSIS — I152 Hypertension secondary to endocrine disorders: Secondary | ICD-10-CM | POA: Diagnosis present

## 2017-11-24 DIAGNOSIS — I10 Essential (primary) hypertension: Secondary | ICD-10-CM

## 2017-11-24 DIAGNOSIS — G4489 Other headache syndrome: Secondary | ICD-10-CM | POA: Diagnosis not present

## 2017-11-24 DIAGNOSIS — R6520 Severe sepsis without septic shock: Secondary | ICD-10-CM | POA: Diagnosis present

## 2017-11-24 DIAGNOSIS — R509 Fever, unspecified: Secondary | ICD-10-CM | POA: Diagnosis not present

## 2017-11-24 DIAGNOSIS — Z7901 Long term (current) use of anticoagulants: Secondary | ICD-10-CM | POA: Diagnosis not present

## 2017-11-24 DIAGNOSIS — A419 Sepsis, unspecified organism: Principal | ICD-10-CM

## 2017-11-24 DIAGNOSIS — E1165 Type 2 diabetes mellitus with hyperglycemia: Secondary | ICD-10-CM | POA: Diagnosis present

## 2017-11-24 DIAGNOSIS — G9341 Metabolic encephalopathy: Secondary | ICD-10-CM | POA: Diagnosis present

## 2017-11-24 DIAGNOSIS — W19XXXA Unspecified fall, initial encounter: Secondary | ICD-10-CM | POA: Diagnosis not present

## 2017-11-24 DIAGNOSIS — E119 Type 2 diabetes mellitus without complications: Secondary | ICD-10-CM

## 2017-11-24 DIAGNOSIS — E785 Hyperlipidemia, unspecified: Secondary | ICD-10-CM | POA: Diagnosis present

## 2017-11-24 DIAGNOSIS — N200 Calculus of kidney: Secondary | ICD-10-CM | POA: Diagnosis not present

## 2017-11-24 DIAGNOSIS — S79912A Unspecified injury of left hip, initial encounter: Secondary | ICD-10-CM | POA: Diagnosis not present

## 2017-11-24 DIAGNOSIS — N201 Calculus of ureter: Secondary | ICD-10-CM | POA: Diagnosis not present

## 2017-11-24 DIAGNOSIS — S199XXA Unspecified injury of neck, initial encounter: Secondary | ICD-10-CM | POA: Diagnosis not present

## 2017-11-24 LAB — CBC WITH DIFFERENTIAL/PLATELET
Basophils Absolute: 0 10*3/uL (ref 0.0–0.1)
Basophils Relative: 0 %
EOS ABS: 0 10*3/uL (ref 0.0–0.7)
EOS PCT: 0 %
HCT: 38.4 % (ref 36.0–46.0)
HEMOGLOBIN: 12.8 g/dL (ref 12.0–15.0)
LYMPHS ABS: 0.5 10*3/uL — AB (ref 0.7–4.0)
Lymphocytes Relative: 4 %
MCH: 30.3 pg (ref 26.0–34.0)
MCHC: 33.3 g/dL (ref 30.0–36.0)
MCV: 90.8 fL (ref 78.0–100.0)
MONOS PCT: 7 %
Monocytes Absolute: 1 10*3/uL (ref 0.1–1.0)
Neutro Abs: 11.7 10*3/uL — ABNORMAL HIGH (ref 1.7–7.7)
Neutrophils Relative %: 89 %
PLATELETS: 229 10*3/uL (ref 150–400)
RBC: 4.23 MIL/uL (ref 3.87–5.11)
RDW: 13.7 % (ref 11.5–15.5)
WBC: 13.1 10*3/uL — ABNORMAL HIGH (ref 4.0–10.5)

## 2017-11-24 LAB — URINALYSIS, ROUTINE W REFLEX MICROSCOPIC
BILIRUBIN URINE: NEGATIVE
GLUCOSE, UA: NEGATIVE mg/dL
KETONES UR: 5 mg/dL — AB
LEUKOCYTES UA: NEGATIVE
NITRITE: NEGATIVE
PH: 5 (ref 5.0–8.0)
Protein, ur: 100 mg/dL — AB
SPECIFIC GRAVITY, URINE: 1.018 (ref 1.005–1.030)
Squamous Epithelial / LPF: NONE SEEN

## 2017-11-24 LAB — COMPREHENSIVE METABOLIC PANEL
ALK PHOS: 56 U/L (ref 38–126)
ALT: 23 U/L (ref 14–54)
ANION GAP: 10 (ref 5–15)
AST: 44 U/L — ABNORMAL HIGH (ref 15–41)
Albumin: 3.2 g/dL — ABNORMAL LOW (ref 3.5–5.0)
BUN: 27 mg/dL — ABNORMAL HIGH (ref 6–20)
CALCIUM: 8.7 mg/dL — AB (ref 8.9–10.3)
CO2: 25 mmol/L (ref 22–32)
Chloride: 96 mmol/L — ABNORMAL LOW (ref 101–111)
Creatinine, Ser: 1.95 mg/dL — ABNORMAL HIGH (ref 0.44–1.00)
GFR, EST AFRICAN AMERICAN: 29 mL/min — AB (ref >60)
GFR, EST NON AFRICAN AMERICAN: 25 mL/min — AB (ref >60)
Glucose, Bld: 207 mg/dL — ABNORMAL HIGH (ref 65–99)
Potassium: 3.1 mmol/L — ABNORMAL LOW (ref 3.5–5.1)
SODIUM: 131 mmol/L — AB (ref 135–145)
Total Bilirubin: 1.2 mg/dL (ref 0.3–1.2)
Total Protein: 6.3 g/dL — ABNORMAL LOW (ref 6.5–8.1)

## 2017-11-24 LAB — PROTIME-INR
INR: 2.16
Prothrombin Time: 23.9 seconds — ABNORMAL HIGH (ref 11.4–15.2)

## 2017-11-24 LAB — I-STAT CG4 LACTIC ACID, ED: Lactic Acid, Venous: 1.29 mmol/L (ref 0.5–1.9)

## 2017-11-24 LAB — PROCALCITONIN: PROCALCITONIN: 16.48 ng/mL

## 2017-11-24 MED ORDER — DEXTROSE 5 % IV SOLN
1.0000 g | INTRAVENOUS | Status: DC
  Administered 2017-11-25 – 2017-11-27 (×3): 1 g via INTRAVENOUS
  Filled 2017-11-24 (×3): qty 10

## 2017-11-24 MED ORDER — WARFARIN SODIUM 5 MG PO TABS
5.0000 mg | ORAL_TABLET | ORAL | Status: AC
  Administered 2017-11-25: 5 mg via ORAL
  Filled 2017-11-24 (×2): qty 1

## 2017-11-24 MED ORDER — POTASSIUM CHLORIDE CRYS ER 20 MEQ PO TBCR
40.0000 meq | EXTENDED_RELEASE_TABLET | Freq: Once | ORAL | Status: AC
  Administered 2017-11-24: 40 meq via ORAL
  Filled 2017-11-24: qty 2

## 2017-11-24 MED ORDER — SODIUM CHLORIDE 0.9 % IV BOLUS (SEPSIS)
1000.0000 mL | Freq: Once | INTRAVENOUS | Status: AC
  Administered 2017-11-24: 1000 mL via INTRAVENOUS

## 2017-11-24 MED ORDER — ZOLPIDEM TARTRATE 5 MG PO TABS: 5.0000 mg | ORAL_TABLET | Freq: Every evening | ORAL | Status: DC | PRN

## 2017-11-24 MED ORDER — ACETAMINOPHEN 325 MG PO TABS
650.0000 mg | ORAL_TABLET | Freq: Four times a day (QID) | ORAL | Status: DC | PRN
  Administered 2017-11-25 – 2017-11-27 (×5): 650 mg via ORAL
  Filled 2017-11-24 (×5): qty 2

## 2017-11-24 MED ORDER — AMLODIPINE BESYLATE 5 MG PO TABS
5.0000 mg | ORAL_TABLET | Freq: Every day | ORAL | Status: DC
  Administered 2017-11-25 – 2017-11-28 (×4): 5 mg via ORAL
  Filled 2017-11-24 (×4): qty 1

## 2017-11-24 MED ORDER — WARFARIN - PHARMACIST DOSING INPATIENT: Freq: Every day | Status: DC

## 2017-11-24 MED ORDER — POTASSIUM CHLORIDE 10 MEQ/100ML IV SOLN
10.0000 meq | INTRAVENOUS | Status: AC
  Administered 2017-11-25 (×2): 10 meq via INTRAVENOUS
  Filled 2017-11-24 (×2): qty 100

## 2017-11-24 MED ORDER — SULFAMETHOXAZOLE-TRIMETHOPRIM 800-160 MG PO TABS: 1.0000 | ORAL_TABLET | Freq: Once | ORAL | Status: DC

## 2017-11-24 MED ORDER — ALLOPURINOL 100 MG PO TABS
100.0000 mg | ORAL_TABLET | Freq: Every day | ORAL | Status: DC
  Administered 2017-11-25 – 2017-11-28 (×4): 100 mg via ORAL
  Filled 2017-11-24 (×4): qty 1

## 2017-11-24 MED ORDER — FLECAINIDE ACETATE 50 MG PO TABS
150.0000 mg | ORAL_TABLET | Freq: Two times a day (BID) | ORAL | Status: DC
  Administered 2017-11-25 – 2017-11-28 (×8): 150 mg via ORAL
  Filled 2017-11-24 (×10): qty 1

## 2017-11-24 MED ORDER — ASPIRIN 81 MG PO CHEW
81.0000 mg | CHEWABLE_TABLET | Freq: Every day | ORAL | Status: DC
  Administered 2017-11-25 – 2017-11-28 (×4): 81 mg via ORAL
  Filled 2017-11-24 (×4): qty 1

## 2017-11-24 MED ORDER — CITALOPRAM HYDROBROMIDE 10 MG PO TABS
10.0000 mg | ORAL_TABLET | Freq: Every day | ORAL | Status: DC
  Administered 2017-11-25 – 2017-11-28 (×4): 10 mg via ORAL
  Filled 2017-11-24 (×4): qty 1

## 2017-11-24 MED ORDER — ACETAMINOPHEN 500 MG PO TABS
1000.0000 mg | ORAL_TABLET | Freq: Once | ORAL | Status: AC
  Administered 2017-11-24: 1000 mg via ORAL
  Filled 2017-11-24: qty 2

## 2017-11-24 MED ORDER — ACETAMINOPHEN 650 MG RE SUPP: 650.0000 mg | Freq: Four times a day (QID) | RECTAL | Status: DC | PRN

## 2017-11-24 MED ORDER — METOPROLOL SUCCINATE ER 100 MG PO TB24
200.0000 mg | ORAL_TABLET | Freq: Every day | ORAL | Status: DC
  Administered 2017-11-25 – 2017-11-28 (×4): 200 mg via ORAL
  Filled 2017-11-24 (×4): qty 2

## 2017-11-24 MED ORDER — IPRATROPIUM-ALBUTEROL 0.5-2.5 (3) MG/3ML IN SOLN: 3.0000 mL | Freq: Four times a day (QID) | RESPIRATORY_TRACT | Status: DC | PRN

## 2017-11-24 MED ORDER — ONDANSETRON HCL 4 MG PO TABS: 4.0000 mg | ORAL_TABLET | Freq: Four times a day (QID) | ORAL | Status: DC | PRN

## 2017-11-24 MED ORDER — DEXTROSE 5 % IV SOLN
1.0000 g | Freq: Once | INTRAVENOUS | Status: AC
  Administered 2017-11-24: 1 g via INTRAVENOUS
  Filled 2017-11-24: qty 10

## 2017-11-24 MED ORDER — ONDANSETRON HCL 4 MG/2ML IJ SOLN: 4.0000 mg | Freq: Four times a day (QID) | INTRAMUSCULAR | Status: DC | PRN

## 2017-11-24 MED ORDER — SODIUM CHLORIDE 0.9 % IV SOLN
INTRAVENOUS | Status: DC
  Administered 2017-11-25: 03:00:00 via INTRAVENOUS
  Administered 2017-11-26: 500 mL via INTRAVENOUS
  Administered 2017-11-26: 21:00:00 via INTRAVENOUS

## 2017-11-24 MED ORDER — INSULIN ASPART 100 UNIT/ML ~~LOC~~ SOLN
0.0000 [IU] | Freq: Every day | SUBCUTANEOUS | Status: DC
  Administered 2017-11-25: 2 [IU] via SUBCUTANEOUS
  Administered 2017-11-26: 3 [IU] via SUBCUTANEOUS
  Administered 2017-11-27: 2 [IU] via SUBCUTANEOUS

## 2017-11-24 MED ORDER — INSULIN ASPART 100 UNIT/ML ~~LOC~~ SOLN
0.0000 [IU] | Freq: Three times a day (TID) | SUBCUTANEOUS | Status: DC
  Administered 2017-11-25 (×2): 2 [IU] via SUBCUTANEOUS
  Administered 2017-11-25: 3 [IU] via SUBCUTANEOUS
  Administered 2017-11-26 (×2): 2 [IU] via SUBCUTANEOUS
  Administered 2017-11-27: 5 [IU] via SUBCUTANEOUS
  Administered 2017-11-27: 2 [IU] via SUBCUTANEOUS
  Administered 2017-11-27 – 2017-11-28 (×2): 3 [IU] via SUBCUTANEOUS
  Administered 2017-11-28 (×2): 2 [IU] via SUBCUTANEOUS

## 2017-11-24 NOTE — Progress Notes (Signed)
ANTICOAGULATION CONSULT NOTE   Pharmacy Consult for warfarin  Indication: atrial fibrillation  Patient Measurements: Weight: 210 lb (95.3 kg)  Vital Signs: Temp: 99.5 F (37.5 C) (12/19 2224) Temp Source: Oral (12/19 2224) BP: 138/62 (12/19 2200) Pulse Rate: 72 (12/19 2200)  Labs: Recent Labs    11/24/17 1742  HGB 12.8  HCT 38.4  PLT 229  LABPROT 23.9*  INR 2.16  CREATININE 1.95*     Medical History: Past Medical History:  Diagnosis Date  . AF (atrial fibrillation) ([place])   . Back pain   . Diabetes mellitus   . Hyperlipidemia   . Hypertension     Assessment: 66 yo female on warfarin for hx of afib admitted with fall. CT w/o evidence of bleed. INR on admit 2.16 with last dose taken on 12/18. CBC stable.   PTA dose: 7.5 mg on Monday and 5 mg AOD's   Goal of Therapy:  INR 2-3  Monitor platelets by anticoagulation protocol: Yes   Plan:  1. Warfarin 5 mg x 1 now  2. Daily INR  Vincenza Hews, PharmD, BCPS 11/24/2017, 10:28 PM

## 2017-11-24 NOTE — ED Notes (Signed)
While adjusting the Pt in bed this RN noticed the Pt was using the wrong words when describing situations. Pt asked for the bed to be laid down. Once the bed was at 20 degrees , RN asked if the was far enough and pt stated "just a pep". Rn asked her to clarify, more up or down. Pt then held her head and said " you know what I am saying". Pt is now A&Ox3, unable to state the year. Pt knows the month and season.

## 2017-11-24 NOTE — H&P (Addendum)
History and Physical    Peggy Chen OVF:[id number] DOB: 07-Apr-1951 DOA: 11/24/2017  Referring MD/NP/PA: Dr. Brantley Stage PCP: [name], MD  Patient coming from: Home  Chief Complaint: falls  I have personally briefly reviewed patient's old medical records in [place]   HPI: Peggy Chen is a 66 y.o. female with medical history significant of PAF on Coumadin, HTN, HLD; who presented with complaints of falls and weakness.  Last 2 days she is been having nausea and vomiting in 2-3 times per day unable to keep any significant amount of food or liquids down.  Seen yesterday by her PCP diagnosed with a urinary tract infection   for which she was started on Bactrim.  Reports taking 1 dose of Bactrim.  Over the course of today she had fallen at least 3 times that she relates to weakness and loss of balance.  She hit her head against a door hinge during 1 of the falls,  but denies any loss of consciousness.    ED Course: Upon admission into the emergency department patient was noted to be initially afebrile, pulse 73-79, pulse 1937, blood pressure 105/94 -153/60, and O2 saturations maintained on room air.  Labs revealed WBC 13.1, sodium 131, potassium 3.1, chloride 96, BUN 27, creatinine 1.95, and glucose 207.  Urinalysis showed large hemoglobin, negative for leukocytes and nitrites, rare bacteria, and 6-30 WBCs.  Patient was given 1 dose of Rocephin.  CT scan of the brain showed no acute abnormalities.  TRH called to admit.  Nursing staff noted that patient became febrile up to 101.2 F and was confused unable to tell what date it was.  Sepsis protocol was initiated and lactate and blood cultures were obtained  Review of Systems  Constitutional: Positive for malaise/fatigue. Negative for chills and fever.  HENT: Negative for ear discharge and ear pain.   Eyes: Negative for photophobia and pain.  Respiratory: Negative for cough and shortness of breath.   Cardiovascular: Negative for  chest pain and leg swelling.  Gastrointestinal: Positive for nausea and vomiting. Negative for abdominal pain.  Genitourinary: Positive for frequency. Negative for dysuria.  Musculoskeletal: Positive for falls. Negative for back pain.  Skin: Negative for itching and rash.  Neurological: Negative for sensory change, speech change and focal weakness.  Psychiatric/Behavioral: Positive for memory loss. Negative for substance abuse.    Past Medical History:  Diagnosis Date  . AF (atrial fibrillation) ([place])   . Back pain   . Diabetes mellitus   . Hyperlipidemia   . Hypertension     Past Surgical History:  Procedure Laterality Date  . BREAST CYST REMOVAL     RIGHT BREAST  . CARDIOVERSION N/A 04/17/2014   Procedure: CARDIOVERSION;  Surgeon: Thayer Headings, MD;  Location: [facility];  Service: Cardiovascular;  Laterality: N/A;  . COLON SURGERY     May 2000  . COLONOSCOPY    . TONSILLECTOMY    . US ECHOCARDIOGRAPHY  12/30/2009   EF 55-60%     reports that she quit smoking about 31 years ago. she has never used smokeless tobacco. She reports that she drinks alcohol. She reports that she does not use drugs.  Allergies  Allergen Reactions  . Penicillins Hives    Family History  Problem Relation Age of Onset  . COPD Mother   . Heart attack Father     Prior to Admission medications   Medication Sig Start Date End Date Taking? Authorizing Provider  allopurinol (ZYLOPRIM) 100  MG tablet Take 1 tablet as needed by mouth. 10/05/17   [provider]  amLODipine (NORVASC) 5 MG tablet Take 5 mg by mouth daily. 08/11/16   [provider]  aspirin 81 MG tablet Take by mouth. 01/05/11   [provider]  citalopram (CELEXA) 10 MG tablet Take 10 mg by mouth daily.    [provider]  COLCRYS 0.6 MG tablet Take 1 tablet as needed by mouth. 10/05/17   [provider]  cyanocobalamin 1000 MCG tablet Take 100 mcg by mouth daily.    [provider]  flecainide (TAMBOCOR) 150 MG tablet Take 1 tablet (150 mg total) by mouth 2 (two) times daily. 06/17/15   Nahser, Wonda Cheng, MD  HYDROcodone-acetaminophen (NORCO/VICODIN) 5-325 MG per tablet Take 1 tablet by mouth every 6 (six) hours as needed for moderate pain.    [provider]  JANUMET 50-1000 MG tablet Take 1 tablet by mouth 2 (two) times daily. 08/12/16   [provider]  metoprolol (TOPROL-XL) 200 MG 24 hr tablet TAKE ONE TABLET BY MOUTH ONCE DAILY 11/09/17   Nahser, Wonda Cheng, MD  olmesartan-hydrochlorothiazide (BENICAR HCT) 20-12.5 MG tablet Take 1 tablet daily by mouth. 10/11/17   [provider]  valsartan-hydrochlorothiazide (DIOVAN-HCT) 160-25 MG tablet Take 1 tablet by mouth daily. 06/21/16   [provider]  warfarin (COUMADIN) 5 MG tablet Take 5-7.5 mg by mouth daily. Take 7.5 mg by mouth on Monday.  Take 5 mg by mouth on all other days.    [provider]  zolpidem (AMBIEN) 5 MG tablet Take 2.5 mg by mouth at bedtime.     [provider]    Physical Exam:  Constitutional: Elderly female who appears sick, but nontoxic and able to follow commands. Vitals:   11/24/17 1700 11/24/17 1745 11/24/17 1900 11/24/17 1930  BP: (!) 129/58  (!) 139/97 (!) 134/59  Pulse:  73 79 79  Resp:  20 (!) 31 (!) 37  Temp:      TempSrc:      SpO2:  96% 95% 96%  Weight:       Eyes: PERRL, lids and conjunctivae normal ENMT: Mucous membranes are moist. Posterior pharynx clear of any exudate or lesions.Normal dentition.  Neck: normal, supple, no masses, no thyromegaly Respiratory: clear to auscultation bilaterally, no wheezing, no crackles. Normal respiratory effort. No accessory muscle use.  Cardiovascular: Regular rate and rhythm, no murmurs / rubs / gallops. No extremity edema. 2+ pedal pulses. No carotid bruits.  Abdomen: no tenderness, no masses palpated. No hepatosplenomegaly. Bowel sounds positive.  Musculoskeletal: no clubbing / cyanosis. No  joint deformity upper and lower extremities. Good ROM, no contractures. Normal muscle tone.  Skin: no rashes, lesions, ulcers. No induration Neurologic: CN 2-12 grossly intact. Sensation intact, DTR normal. Strength 5/5 in all 4.  Psychiatric: Normal judgment and insight. Alert and oriented x person and place but is confused on date.  Patient intermittently with word salad.    Labs on Admission: I have personally reviewed following labs and imaging studies  CBC: Recent Labs  Lab 11/24/17 1742  WBC 13.1*  NEUTROABS 11.7*  HGB 12.8  HCT 38.4  MCV 90.8  PLT 102   Basic Metabolic Panel: Recent Labs  Lab 11/24/17 1742  NA 131*  K 3.1*  CL 96*  CO2 25  GLUCOSE 207*  BUN 27*  CREATININE 1.95*  CALCIUM 8.7*   GFR: Estimated Creatinine Clearance: 32 mL/min (A) (by C-G formula based on  SCr of 1.95 mg/dL (H)). Liver Function Tests: Recent Labs  Lab 11/24/17 1742  AST 44*  ALT 23  ALKPHOS 56  BILITOT 1.2  PROT 6.3*  ALBUMIN 3.2*   No results for input(s): LIPASE, AMYLASE in the last 168 hours. No results for input(s): AMMONIA in the last 168 hours. Coagulation Profile: Recent Labs  Lab 11/24/17 1742  INR 2.16   Cardiac Enzymes: No results for input(s): CKTOTAL, CKMB, CKMBINDEX, TROPONINI in the last 168 hours. BNP (last 3 results) No results for input(s): PROBNP in the last 8760 hours. HbA1C: No results for input(s): HGBA1C in the last 72 hours. CBG: No results for input(s): GLUCAP in the last 168 hours. Lipid Profile: No results for input(s): CHOL, HDL, LDLCALC, TRIG, CHOLHDL, LDLDIRECT in the last 72 hours. Thyroid Function Tests: No results for input(s): TSH, T4TOTAL, FREET4, T3FREE, THYROIDAB in the last 72 hours. Anemia Panel: No results for input(s): VITAMINB12, FOLATE, FERRITIN, TIBC, IRON, RETICCTPCT in the last 72 hours. Urine analysis:    Component Value Date/Time   COLORURINE YELLOW 11/24/2017 1906   APPEARANCEUR HAZY (A) 11/24/2017 1906    LABSPEC 1.018 11/24/2017 1906   PHURINE 5.0 11/24/2017 1906   GLUCOSEU NEGATIVE 11/24/2017 1906   HGBUR LARGE (A) 11/24/2017 [place] NEGATIVE 11/24/2017 1906   KETONESUR 5 (A) 11/24/2017 1906   PROTEINUR 100 (A) 11/24/2017 1906   NITRITE NEGATIVE 11/24/2017 1906   LEUKOCYTESUR NEGATIVE 11/24/2017 1906   Sepsis Labs: No results found for this or any previous visit (from the past 240 hour(s)).   Radiological Exams on Admission: Ct Head Wo Contrast  Result Date: 11/24/2017 CLINICAL DATA:  Fall with hematoma to the forehead EXAM: CT HEAD WITHOUT CONTRAST CT CERVICAL SPINE WITHOUT CONTRAST TECHNIQUE: Multidetector CT imaging of the head and cervical spine was performed following the standard protocol without intravenous contrast. Multiplanar CT image reconstructions of the cervical spine were also generated. COMPARISON:  None. FINDINGS: CT HEAD FINDINGS Brain: No acute territorial infarction, hemorrhage, or intracranial mass is visualized. Mild small vessel ischemic changes of the white matter. 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 Normal ventricle size Vascular: No hyperdense vessels.  Carotid artery calcification Skull: No fracture Sinuses/Orbits: Mild mucosal thickening in the maxillary and ethmoid sinuses. No acute orbital abnormality Other: Moderate right forehead hematoma and small posterior scalp swelling at the vertex CT CERVICAL SPINE FINDINGS Alignment: Straightening of the cervical spine. No subluxation. The set alignment within normal limits. Skull base and vertebrae: No acute fracture. No primary bone lesion or focal pathologic process. Soft tissues and spinal canal: No prevertebral fluid or swelling. No visible canal hematoma. Disc levels: Mild degenerative changes at C4-C5 and C5-C6 with moderate changes at C6-C7. Upper chest: Negative. Other: Carotid artery calcification IMPRESSION: 1. No CT evidence for acute intracranial abnormality. Small vessel ischemic changes of the  white matter. Moderate right forehead hematoma and small posterior scalp swelling 2. Straightening of the cervical spine with degenerative changes. No definite acute osseous abnormality. Electronically Signed   By: Donavan Foil M.D.   On: 11/24/2017 17:25   Ct Cervical Spine Wo Contrast  Result Date: 11/24/2017 CLINICAL DATA:  Fall with hematoma to the forehead EXAM: CT HEAD WITHOUT CONTRAST CT CERVICAL SPINE WITHOUT CONTRAST TECHNIQUE: Multidetector CT imaging of the head and cervical spine was performed following the standard protocol without intravenous contrast. Multiplanar CT image reconstructions of the cervical spine were also generated. COMPARISON:  None. FINDINGS: CT HEAD FINDINGS Brain: No acute territorial infarction,  hemorrhage, or intracranial mass is visualized. Mild small vessel ischemic changes of the white matter. 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 Normal ventricle size Vascular: No hyperdense vessels.  Carotid artery calcification Skull: No fracture Sinuses/Orbits: Mild mucosal thickening in the maxillary and ethmoid sinuses. No acute orbital abnormality Other: Moderate right forehead hematoma and small posterior scalp swelling at the vertex CT CERVICAL SPINE FINDINGS Alignment: Straightening of the cervical spine. No subluxation. The set alignment within normal limits. Skull base and vertebrae: No acute fracture. No primary bone lesion or focal pathologic process. Soft tissues and spinal canal: No prevertebral fluid or swelling. No visible canal hematoma. Disc levels: Mild degenerative changes at C4-C5 and C5-C6 with moderate changes at C6-C7. Upper chest: Negative. Other: Carotid artery calcification IMPRESSION: 1. No CT evidence for acute intracranial abnormality. Small vessel ischemic changes of the white matter. Moderate right forehead hematoma and small posterior scalp swelling 2. Straightening of the cervical spine with degenerative changes. No definite acute osseous abnormality.  Electronically Signed   By: Donavan Foil M.D.   On: 11/24/2017 17:25   Dg Hip Unilat With Pelvis 2-3 Views Left  Result Date: 11/24/2017 CLINICAL DATA:  Left hip pain secondary to a fall. EXAM: DG HIP (WITH OR WITHOUT PELVIS) 2-3V LEFT COMPARISON:  None. FINDINGS: There is no evidence of hip fracture or dislocation. There is no evidence of arthropathy or other focal bone abnormality. IMPRESSION: Negative. Electronically Signed   By: Lorriane Shire M.D.   On: 11/24/2017 16:54    EKG: Independently reviewed.  Sinus rhythm with low voltage  Assessment/Plan SIRS/sepsis 2/2 urinary tract infection: Acute.  Patient presented with fever up to 101.2 F and WBC 13.1 recently diagnosed urinary tract infection.  Patient had initially been started on Bactrim, but was not able to tolerate p.o. due to nausea vomiting symptoms.  Patient had already been given 1 dose of Rocephin.  Sepsis protocol initiated. - Admit to telemetry bed - Sepsis protocol initiated - Follow-up blood and urine cultures - Trend lactic acid levels - Tylenol prn fever - IV fluids of normal saline at 100 ml/h  Acute encephalopathy: Patient intermittently mixing words.  Suspect secondary to underlying infection.  If persists may warrant MRI CT scan of the brain was negative for any acute abnormalities and patient has history of A. fib. - Neurochecks    Nausea and vomiting - Antiemetics as needed - Advance diet as tolerated  Acute kidney injury: Last available creatinine from 2015 was noted to be 0.9, but on admission creatinine 1.95 with BUN 27. -Check renal ultrasound - IV fluids as seen above - Monitor ins and outs  Hypokalemia: Potassium 3.1 on admission.  Patient initially given 40 mEq of potassium chloride orally. - give 20 mEq of potassium chloride IV - Continue to monitor and replace as needed  Falls: Patient had multiple falls prior to arrival.  Suspect secondary to dehydration and/or weakness. - Check orthostatic  vital signs - Physical therapy to eval  Paroxysmal atrial fibrillation on chronic anticoagulation: Patient currently appears to be in sinus rhythm with heart rates currently controlled.  INR therapeutic at 2.16.  - Continue flecainide  - Coumadin per pharmacy  Diabetes mellitus type 2: Patient presents with elevated blood glucose of 207 on admission.  No hemoglobin A1c on file. - Hypoglycemic protocol - Hold Janumet - CBGs q. before meals and at bedtime with sensitive SSI  Essential hypertension - Did not restart Benicar or Diovan 2/2 acute kidney injury and it  appears patient no longer on these medicines - Continue amlodipine, Toprol as tolerated    DVT prophylaxis: Continue Coumadin Code Status: Full Family Communication: none Disposition Plan: [place] discharge home in 2-3 days Consults called: none Admission status: Inpatient   Norval Morton MD [facility] Pager [phone]   If 7PM-7AM, please contact night-coverage [email or web address] Password [facility]  11/24/2017, 8:32 PM

## 2017-11-24 NOTE — ED Notes (Signed)
1st fall - getting out of bed, feet slipped out from under her.  2nd fall - pt was going to bathroom and tripped on something on the floor 3rd fall - was going to the bathroom and tried to reach the grab bar and fell.,

## 2017-11-24 NOTE — ED Triage Notes (Signed)
Per EMS. Pt fell three times today. Pt on Blood thinner but doesn't know which one. Pt c/o of L hip pain and hematoma on forehead, denies LOC. Pt also c/o of extreme thirst and frequent urination. CBG 264. VSS A&Ox4

## 2017-11-24 NOTE — ED Provider Notes (Signed)
[facility] EMERGENCY DEPARTMENT Provider Note   CSN: [id number] Arrival date & time: 11/24/17  1538     History   Chief Complaint Chief Complaint  Patient presents with  . Fall    HPI Airyanna Dipalma Wirtz is a 66 y.o. female.  HPI 66 year old female who presents with frequent falls today.  She has a history of paroxysmal atrial fibrillation on Coumadin, diabetes, hypertension hyperlipidemia.  Reports that one day ago she was with nausea, vomiting.  States that she has not been able to eat or drink much over the past day.  Although vomiting resolved today, she has been feeling very weak and dehydrated.  States that she has had 3 falls today where she has lost balance due to weakness.  One was getting out of bed when her feet slipped out from underneath her.  Later on in the day she tripped on something on the floor while going to the bathroom, and then while in the bathroom lost balance, try to reach for a bar and fell forward hitting her head against the door hinges.  She denied any loss of consciousness.  Denies headache, focal weakness or numbness, syncope or near syncope, fever, cough, shortness of breath, chest pains, abdominal pains.  States that she was diagnosed with UTI yesterday and started on Bactrim but has not yet taken her antibiotics.  Has been having urinary frequency no dysuria.  Reports diarrhea, but this is chronic.   Past Medical History:  Diagnosis Date  . AF (atrial fibrillation) ([place])   . Back pain   . Diabetes mellitus   . Hyperlipidemia   . Hypertension     Patient Active Problem List   Diagnosis Date Noted  . Hyperlipidemia 06/17/2015  . HTN (hypertension) 09/28/2011  . Atrial fibrillation ([place]) 06/29/2011    Past Surgical History:  Procedure Laterality Date  . BREAST CYST REMOVAL     RIGHT BREAST  . CARDIOVERSION N/A 04/17/2014   Procedure: CARDIOVERSION;  Surgeon: Thayer Headings, MD;  Location: [facility];  Service:  Cardiovascular;  Laterality: N/A;  . COLON SURGERY     May 2000  . COLONOSCOPY    . TONSILLECTOMY    . US ECHOCARDIOGRAPHY  12/30/2009   EF 55-60%    OB History    No data available       Home Medications    Prior to Admission medications   Medication Sig Start Date End Date Taking? Authorizing Provider  allopurinol (ZYLOPRIM) 100 MG tablet Take 1 tablet as needed by mouth. 10/05/17   [provider]  amLODipine (NORVASC) 5 MG tablet Take 5 mg by mouth daily. 08/11/16   [provider]  aspirin 81 MG tablet Take by mouth. 01/05/11   [provider]  citalopram (CELEXA) 10 MG tablet Take 10 mg by mouth daily.    [provider]  COLCRYS 0.6 MG tablet Take 1 tablet as needed by mouth. 10/05/17   [provider]  cyanocobalamin 1000 MCG tablet Take 100 mcg by mouth daily.    [provider]  flecainide (TAMBOCOR) 150 MG tablet Take 1 tablet (150 mg total) by mouth 2 (two) times daily. 06/17/15   Nahser, Wonda Cheng, MD  HYDROcodone-acetaminophen (NORCO/VICODIN) 5-325 MG per tablet Take 1 tablet by mouth every 6 (six) hours as needed for moderate pain.    [provider]  JANUMET 50-1000 MG tablet Take 1 tablet by mouth 2 (two) times daily. 08/12/16   [provider]  metoprolol (TOPROL-XL) 200 MG 24 hr tablet TAKE ONE TABLET BY MOUTH ONCE DAILY 11/09/17   Nahser, Wonda Cheng, MD  olmesartan-hydrochlorothiazide (BENICAR HCT) 20-12.5 MG tablet Take 1 tablet daily by mouth. 10/11/17   [provider]  valsartan-hydrochlorothiazide (DIOVAN-HCT) 160-25 MG tablet Take 1 tablet by mouth daily. 06/21/16   [provider]  warfarin (COUMADIN) 5 MG tablet Take 5-7.5 mg by mouth daily. Take 7.5 mg by mouth on Monday.  Take 5 mg by mouth on all other days.    [provider]  zolpidem (AMBIEN) 5 MG tablet Take 2.5 mg by mouth at bedtime.     [provider]    Family History Family History  Problem  Relation Age of Onset  . COPD Mother   . Heart attack Father     Social History Social History   Tobacco Use  . Smoking status: Former Smoker    Last attempt to quit: 06/22/1986    Years since quitting: 31.4  . Smokeless tobacco: Never Used  Substance Use Topics  . Alcohol use: Yes    Comment: occasional  . Drug use: No     Allergies   Penicillins   Review of Systems Review of Systems  Constitutional: Positive for fatigue. Negative for fever.  Respiratory: Negative for cough and shortness of breath.   Cardiovascular: Negative for chest pain and leg swelling.  Gastrointestinal: Positive for diarrhea. Negative for abdominal pain.  Genitourinary: Positive for frequency.  Hematological: Bruises/bleeds easily.  All other systems reviewed and are negative.    Physical Exam Updated Vital Signs BP (!) 105/94   Pulse 79   Temp 98.2 F (36.8 C) (Oral)   Resp 19   Wt 95.3 kg (210 lb)   SpO2 95%   BMI 34.95 kg/m   Physical Exam Physical Exam  Nursing note and vitals reviewed. Constitutional: Well developed, well nourished, non-toxic, and in no acute distress Head: Normocephalic and atraumatic.  Mouth/Throat: Oropharynx is clear and dry mucous membranes.  Neck: Normal range of motion. Neck supple.  Cardiovascular: Normal rate and regular rhythm.   Pulmonary/Chest: Effort normal and breath sounds normal.  Abdominal: Soft. There is no tenderness. There is no rebound and no guarding.  Musculoskeletal: Normal range of motion.  Neurological: Alert, no facial droop, fluent speech, moves all extremities symmetrically, PERRL, EOMI, sensation to light touch intact throughout, full strength in bilateral upper and lower extremities Skin: Skin is warm and dry.  Psychiatric: Cooperative   ED Treatments / Results  Labs (all labs ordered are listed, but only abnormal results are displayed) Labs Reviewed  CBC WITH DIFFERENTIAL/PLATELET - Abnormal; Notable for the following  components:      Result Value   WBC 13.1 (*)    Neutro Abs 11.7 (*)    Lymphs Abs 0.5 (*)    All other components within normal limits  COMPREHENSIVE METABOLIC PANEL - Abnormal; Notable for the following components:   Sodium 131 (*)    Potassium 3.1 (*)    Chloride 96 (*)    Glucose, Bld 207 (*)    BUN 27 (*)    Creatinine, Ser 1.95 (*)    Calcium 8.7 (*)    Total Protein 6.3 (*)    Albumin 3.2 (*)    AST 44 (*)    GFR calc non Af Amer 25 (*)    GFR calc Af Amer 29 (*)    All other components within normal limits  URINALYSIS, ROUTINE W REFLEX MICROSCOPIC -  Abnormal; Notable for the following components:   APPearance HAZY (*)    Hgb urine dipstick LARGE (*)    Ketones, ur 5 (*)    Protein, ur 100 (*)    Bacteria, UA RARE (*)    All other components within normal limits  PROTIME-INR - Abnormal; Notable for the following components:   Prothrombin Time 23.9 (*)    All other components within normal limits  URINE CULTURE    EKG  EKG Interpretation  Date/Time:  Wednesday November 24 2017 17:26:38 EST Ventricular Rate:  71 PR Interval:    QRS Duration: 94 QT Interval:  429 QTC Calculation: 467 R Axis:   4 Text Interpretation:  Sinus rhythm Low voltage, precordial leads Borderline repolarization abnormality no acute changes  Confirmed by Brantley Stage [phone]) on 11/24/2017 5:39:07 PM       Radiology Ct Head Wo Contrast  Result Date: 11/24/2017 CLINICAL DATA:  Fall with hematoma to the forehead EXAM: CT HEAD WITHOUT CONTRAST CT CERVICAL SPINE WITHOUT CONTRAST TECHNIQUE: Multidetector CT imaging of the head and cervical spine was performed following the standard protocol without intravenous contrast. Multiplanar CT image reconstructions of the cervical spine were also generated. COMPARISON:  None. FINDINGS: CT HEAD FINDINGS Brain: No acute territorial infarction, hemorrhage, or intracranial mass is visualized. Mild small vessel ischemic changes of the white matter. 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 Normal ventricle size Vascular: No hyperdense vessels.  Carotid artery calcification Skull: No fracture Sinuses/Orbits: Mild mucosal thickening in the maxillary and ethmoid sinuses. No acute orbital abnormality Other: Moderate right forehead hematoma and small posterior scalp swelling at the vertex CT CERVICAL SPINE FINDINGS Alignment: Straightening of the cervical spine. No subluxation. The set alignment within normal limits. Skull base and vertebrae: No acute fracture. No primary bone lesion or focal pathologic process. Soft tissues and spinal canal: No prevertebral fluid or swelling. No visible canal hematoma. Disc levels: Mild degenerative changes at C4-C5 and C5-C6 with moderate changes at C6-C7. Upper chest: Negative. Other: Carotid artery calcification IMPRESSION: 1. No CT evidence for acute intracranial abnormality. Small vessel ischemic changes of the white matter. Moderate right forehead hematoma and small posterior scalp swelling 2. Straightening of the cervical spine with degenerative changes. No definite acute osseous abnormality. Electronically Signed   By: Donavan Foil M.D.   On: 11/24/2017 17:25   Ct Cervical Spine Wo Contrast  Result Date: 11/24/2017 CLINICAL DATA:  Fall with hematoma to the forehead EXAM: CT HEAD WITHOUT CONTRAST CT CERVICAL SPINE WITHOUT CONTRAST TECHNIQUE: Multidetector CT imaging of the head and cervical spine was performed following the standard protocol without intravenous contrast. Multiplanar CT image reconstructions of the cervical spine were also generated. COMPARISON:  None. FINDINGS: CT HEAD FINDINGS Brain: No acute territorial infarction, hemorrhage, or intracranial mass is visualized. Mild small vessel ischemic changes of the white matter. 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 Normal ventricle size Vascular: No hyperdense vessels.  Carotid artery calcification Skull: No fracture Sinuses/Orbits: Mild mucosal thickening in the  maxillary and ethmoid sinuses. No acute orbital abnormality Other: Moderate right forehead hematoma and small posterior scalp swelling at the vertex CT CERVICAL SPINE FINDINGS Alignment: Straightening of the cervical spine. No subluxation. The set alignment within normal limits. Skull base and vertebrae: No acute fracture. No primary bone lesion or focal pathologic process. Soft tissues and spinal canal: No prevertebral fluid or swelling. No visible canal hematoma. Disc levels: Mild degenerative changes at C4-C5 and C5-C6 with moderate changes at C6-C7.  Upper chest: Negative. Other: Carotid artery calcification IMPRESSION: 1. No CT evidence for acute intracranial abnormality. Small vessel ischemic changes of the white matter. Moderate right forehead hematoma and small posterior scalp swelling 2. Straightening of the cervical spine with degenerative changes. No definite acute osseous abnormality. Electronically Signed   By: Donavan Foil M.D.   On: 11/24/2017 17:25   Dg Hip Unilat With Pelvis 2-3 Views Left  Result Date: 11/24/2017 CLINICAL DATA:  Left hip pain secondary to a fall. EXAM: DG HIP (WITH OR WITHOUT PELVIS) 2-3V LEFT COMPARISON:  None. FINDINGS: There is no evidence of hip fracture or dislocation. There is no evidence of arthropathy or other focal bone abnormality. IMPRESSION: Negative. Electronically Signed   By: Lorriane Shire M.D.   On: 11/24/2017 16:54    Procedures Procedures (including critical care time)  Medications Ordered in ED Medications  cefTRIAXone (ROCEPHIN) 1 g in dextrose 5 % 50 mL IVPB (1 g Intravenous New Bag/Given 11/24/17 2025)  sodium chloride 0.9 % bolus 1,000 mL (0 mLs Intravenous Stopped 11/24/17 1957)  potassium chloride SA (K-DUR,KLOR-CON) CR tablet 40 mEq (40 mEq Oral Given 11/24/17 2003)     Initial Impression / Assessment and Plan / ED Course  I have reviewed the triage vital signs and the nursing notes.  Pertinent labs & imaging results that were  available during my care of the patient were reviewed by me and considered in my medical decision making (see chart for details).     Presents after multiple falls today on Coumadin.  Patient without focal neurological deficits on exam.  CT head and cervical spine without traumatic injuries.  She does look very dry and dehydrated on exam.  This is likely related to recent vomiting and diagnosis of UTI.  Her blood work is notable for dehydration and acute kidney injury.  UA does look improved, but she has been on recent antibiotics which likely is clearing her urine.  She is given a dose of ceftriaxone as well as IV fluids.  Patient still looks very fatigued and dehydrated.  I am concerned about her safety at home as she is unsteady here with me in the ED.  I discussed with Dr. Tamala Julian from hospitalist service will admit for ongoing management.  Final Clinical Impressions(s) / ED Diagnoses   Final diagnoses:  Fall, initial encounter  Acute kidney injury [facility])  Lower urinary tract infectious disease    ED Discharge Orders    None       Forde Dandy, MD 11/24/17 2041

## 2017-11-24 NOTE — Progress Notes (Signed)
Pharmacy Antibiotic Note KESSLER KOPINSKI is a 66 y.o. female admitted on 11/24/2017 with concern for UTI. Pharmacy has been consulted for ceftriaxone dosing.  Plan: 1. Ceftriaxone 1 gram IV every 24 hours 2. Will follow peripherally   Weight: 210 lb (95.3 kg)  Temp (24hrs), Avg:99.7 F (37.6 C), Min:98.2 F (36.8 C), Max:101.2 F (38.4 C)  Recent Labs  Lab 11/24/17 1742 11/24/17 2107  WBC 13.1*  --   CREATININE 1.95*  --   LATICACIDVEN  --  1.29    Estimated Creatinine Clearance: 32 mL/min (A) (by C-G formula based on SCr of 1.95 mg/dL (H)).    Allergies  Allergen Reactions  . Penicillins Hives   Thank you for allowing pharmacy to be a part of this patient's care.  Vincenza Hews, PharmD, BCPS 11/24/2017, 9:59 PM

## 2017-11-25 ENCOUNTER — Inpatient Hospital Stay (HOSPITAL_COMMUNITY): Payer: Medicare Other

## 2017-11-25 ENCOUNTER — Other Ambulatory Visit: Payer: Self-pay

## 2017-11-25 DIAGNOSIS — I7090 Unspecified atherosclerosis: Secondary | ICD-10-CM

## 2017-11-25 DIAGNOSIS — N1330 Unspecified hydronephrosis: Secondary | ICD-10-CM

## 2017-11-25 DIAGNOSIS — K760 Fatty (change of) liver, not elsewhere classified: Secondary | ICD-10-CM

## 2017-11-25 DIAGNOSIS — Z7901 Long term (current) use of anticoagulants: Secondary | ICD-10-CM

## 2017-11-25 DIAGNOSIS — W19XXXA Unspecified fall, initial encounter: Secondary | ICD-10-CM | POA: Diagnosis present

## 2017-11-25 DIAGNOSIS — K5790 Diverticulosis of intestine, part unspecified, without perforation or abscess without bleeding: Secondary | ICD-10-CM

## 2017-11-25 DIAGNOSIS — E119 Type 2 diabetes mellitus without complications: Secondary | ICD-10-CM

## 2017-11-25 DIAGNOSIS — E876 Hypokalemia: Secondary | ICD-10-CM

## 2017-11-25 HISTORY — DX: Fatty (change of) liver, not elsewhere classified: K76.0

## 2017-11-25 HISTORY — DX: Unspecified hydronephrosis: N13.30

## 2017-11-25 HISTORY — DX: Unspecified atherosclerosis: I70.90

## 2017-11-25 HISTORY — DX: Diverticulosis of intestine, part unspecified, without perforation or abscess without bleeding: K57.90

## 2017-11-25 LAB — GLUCOSE, CAPILLARY
GLUCOSE-CAPILLARY: 194 mg/dL — AB (ref 65–99)
GLUCOSE-CAPILLARY: 197 mg/dL — AB (ref 65–99)
GLUCOSE-CAPILLARY: 212 mg/dL — AB (ref 65–99)
Glucose-Capillary: 201 mg/dL — ABNORMAL HIGH (ref 65–99)
Glucose-Capillary: 208 mg/dL — ABNORMAL HIGH (ref 65–99)

## 2017-11-25 LAB — CBC
HEMATOCRIT: 40.8 % (ref 36.0–46.0)
HEMOGLOBIN: 13.1 g/dL (ref 12.0–15.0)
MCH: 29.4 pg (ref 26.0–34.0)
MCHC: 32.1 g/dL (ref 30.0–36.0)
MCV: 91.7 fL (ref 78.0–100.0)
Platelets: 232 10*3/uL (ref 150–400)
RBC: 4.45 MIL/uL (ref 3.87–5.11)
RDW: 13.7 % (ref 11.5–15.5)
WBC: 14 10*3/uL — ABNORMAL HIGH (ref 4.0–10.5)

## 2017-11-25 LAB — BASIC METABOLIC PANEL
Anion gap: 11 (ref 5–15)
BUN: 28 mg/dL — AB (ref 6–20)
CHLORIDE: 99 mmol/L — AB (ref 101–111)
CO2: 24 mmol/L (ref 22–32)
CREATININE: 2.08 mg/dL — AB (ref 0.44–1.00)
Calcium: 8.8 mg/dL — ABNORMAL LOW (ref 8.9–10.3)
GFR calc Af Amer: 27 mL/min — ABNORMAL LOW (ref >60)
GFR calc non Af Amer: 24 mL/min — ABNORMAL LOW (ref >60)
Glucose, Bld: 176 mg/dL — ABNORMAL HIGH (ref 65–99)
Potassium: 3.1 mmol/L — ABNORMAL LOW (ref 3.5–5.1)
Sodium: 134 mmol/L — ABNORMAL LOW (ref 135–145)

## 2017-11-25 LAB — PROTIME-INR
INR: 2
Prothrombin Time: 22.5 seconds — ABNORMAL HIGH (ref 11.4–15.2)

## 2017-11-25 MED ORDER — WARFARIN SODIUM 7.5 MG PO TABS
7.5000 mg | ORAL_TABLET | Freq: Once | ORAL | Status: AC
  Administered 2017-11-25: 7.5 mg via ORAL
  Filled 2017-11-25: qty 1

## 2017-11-25 MED ORDER — PNEUMOCOCCAL VAC POLYVALENT 25 MCG/0.5ML IJ INJ
0.5000 mL | INJECTION | INTRAMUSCULAR | Status: AC
  Administered 2017-11-26: 0.5 mL via INTRAMUSCULAR
  Filled 2017-11-25: qty 0.5

## 2017-11-25 NOTE — Discharge Instructions (Addendum)

## 2017-11-25 NOTE — Care Management Note (Signed)
Case Management Note  Patient Details  Name: Peggy Chen MRN: [id number] Date of Birth: September 03, 1951  Subjective/Objective:      Pt admitted with UTI - falls              Action/Plan:   PTA independent from home alone - however family/friends will be with pt post discharge.  Pt states that she is usually "vibrant" she feels that she fell in the home because she wasn't able to eat due to sickness prior to admit.  Pt has PCP and denied barriers to paying for medications.  PT eval ordered.  CM will continue to follow for discharge needs    Expected Discharge Date:  11/25/17               Expected Discharge Plan:  [place]  In-House Referral:     Discharge planning Services  CM Consult  Post Acute Care Choice:    Choice offered to:     DME Arranged:    DME Agency:     HH Arranged:    HH Agency:     Status of Service:     If discussed at [employer], dates discussed:    Additional Comments:  [name], RN 11/25/2017, 10:08 AM

## 2017-11-25 NOTE — Evaluation (Signed)
Physical Therapy Evaluation Patient Details Name: Peggy Chen MRN: [id number] DOB: [birth year] Today's Date: 11/25/2017   History of Present Illness  66 y.o. female with medical history significant of PAF on Coumadin, HTN, HLD; who presented with complaints of falls and weakness s/p 2 days of nausea and vomiting. Pt found to have UTI.   Clinical Impression  Pt admitted with above diagnosis. Pt currently with functional limitations due to the deficits listed below (see PT Problem List). Pt is limited in her mobility by generalized weakness and fatigue. Pt is currently supervision for bed mobility, and transfers, and min guard for ambulation of 50 feet with RW assist.  Pt will benefit from skilled PT to increase their independence and safety with mobility to allow discharge to the venue listed below.       Follow Up Recommendations Home health PT;Supervision - Intermittent    Equipment Recommendations  Rolling walker with 5" wheels    Recommendations for Other Services       Precautions / Restrictions Precautions Precautions: Fall Restrictions Weight Bearing Restrictions: No      Mobility  Bed Mobility Overal bed mobility: Needs Assistance Bed Mobility: Supine to Sit;Rolling Rolling: Min assist(for getting off bedpan)   Supine to sit: Supervision     General bed mobility comments: supervision for safety with supine to sit, use of bedrail to pull to EoB  Transfers Overall transfer level: Needs assistance Equipment used: Rolling walker (2 wheeled) Transfers: Sit to/from Stand Sit to Stand: Supervision         General transfer comment: supervision for safety able to come to standing before reaching for RW  Ambulation/Gait Ambulation/Gait assistance: Min guard Ambulation Distance (Feet): 50 Feet Assistive device: Rolling walker (2 wheeled) Gait Pattern/deviations: Step-through pattern;Decreased step length - right;Decreased step length - left;Shuffle;Trunk  flexed Gait velocity: slowed Gait velocity interpretation: Below normal speed for age/gender General Gait Details: hands on min guard for safety, vc for proximity to RW, slow shuffling gait, after 25 feet pt stated she was feeling weak and returned to room       Balance Overall balance assessment: Needs assistance Sitting-balance support: No upper extremity supported;Feet supported Sitting balance-Leahy Scale: Good     Standing balance support: No upper extremity supported;During functional activity Standing balance-Leahy Scale: Fair Standing balance comment: reached to RW for dynamic activities in standing                             Pertinent Vitals/Pain Pain Assessment: No/denies pain    Home Living Family/patient expects to be discharged to:: Private residence Living Arrangements: Alone Available Help at Discharge: Neighbor;Available PRN/intermittently Type of Home: House Home Access: Stairs to enter Entrance Stairs-Rails: None Entrance Stairs-Number of Steps: 2 Home Layout: One level Home Equipment: Cane - single point;Tub bench;Hand held shower head;Grab bars - toilet      Prior Function Level of Independence: Independent         Comments: 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, driving, limited community ambulation, independent with ADLs, IADLS     Hand Dominance   Dominant Hand: Right    Extremity/Trunk Assessment   Upper Extremity Assessment Upper Extremity Assessment: Overall WFL for tasks assessed    Lower Extremity Assessment Lower Extremity Assessment: Generalized weakness    Cervical / Trunk Assessment Cervical / Trunk Assessment: Kyphotic  Communication   Communication: No difficulties  Cognition Arousal/Alertness: Awake/alert Behavior During Therapy: WFL for tasks assessed/performed Overall Cognitive Status: Within Functional  Limits for tasks assessed                                               Assessment/Plan    PT  Assessment Patient needs continued PT services  PT Problem List Decreased strength;Decreased activity tolerance;Decreased mobility;Decreased knowledge of use of DME;Decreased safety awareness       PT Treatment Interventions DME instruction;Gait training;Functional mobility training;Therapeutic activities;Therapeutic exercise;Balance training;Patient/family education    PT Goals (Current goals can be found in the Care Plan section)  Acute Rehab PT Goals Patient Stated Goal: go home to be with grandkids for holiday PT Goal Formulation: With patient Time For Goal Achievement: 12/09/17 Potential to Achieve Goals: Good    Frequency Min 3X/week   Barriers to discharge Decreased caregiver support         AM-PAC PT "6 Clicks" Daily Activity  Outcome Measure Difficulty turning over in bed (including adjusting bedclothes, sheets and blankets)?: A Little Difficulty moving from lying on back to sitting on the side of the bed? : A Lot Difficulty sitting down on and standing up from a chair with arms (e.g., wheelchair, bedside commode, etc,.)?: A Little Help needed moving to and from a bed to chair (including a wheelchair)?: A Little Help needed walking in hospital room?: A Little Help needed climbing 3-5 steps with a railing? : A Lot 6 Click Score: 16    End of Session Equipment Utilized During Treatment: Gait belt Activity Tolerance: Patient limited by fatigue Patient left: in chair;with call bell/phone within reach;with chair alarm set Nurse Communication: Mobility status PT Visit Diagnosis: Unsteadiness on feet (R26.81);Other abnormalities of gait and mobility (R26.89);Muscle weakness (generalized) (M62.81);Difficulty in walking, not elsewhere classified (R26.2)    Time: 1448-1856 PT Time Calculation (min) (ACUTE ONLY): 54 min   Charges:   PT Evaluation $PT Eval Moderate Complexity: 1 Mod PT Treatments $Gait Training: 8-22 mins $Therapeutic Activity: 23-37 mins   PT G Codes:         Kemal Amores B. Migdalia Dk PT, DPT Acute Rehabilitation  [phone] Pager [phone]    [place] 11/25/2017, 11:40 AM

## 2017-11-25 NOTE — Plan of Care (Signed)
  Education: Knowledge of General Education information will improve 11/25/2017 0200 - Progressing by Irish Lack, RN   Clinical Measurements: Ability to maintain clinical measurements within normal limits will improve 11/25/2017 0200 - Progressing by Irish Lack, RN   Safety: Ability to remain free from injury will improve 11/25/2017 0200 - Progressing by Irish Lack, RN

## 2017-11-25 NOTE — Progress Notes (Signed)
ANTICOAGULATION CONSULT NOTE - Follow Up Consult  Pharmacy Consult for Coumadin Indication: atrial fibrillation  Patient Measurements: Height: 5\' 5"  (165.1 cm) Weight: 218 lb 4.1 oz (99 kg) IBW/kg (Calculated) : 57  Vital Signs: Temp: 98.7 F (37.1 C) (12/20 1418) Temp Source: Oral (12/20 1418) BP: 132/49 (12/20 1418) Pulse Rate: 67 (12/20 1418)  Labs: Recent Labs    11/24/17 1742 11/25/17 0417  HGB 12.8 13.1  HCT 38.4 40.8  PLT 229 232  LABPROT 23.9* 22.5*  INR 2.16 2.00  CREATININE 1.95* 2.08*    Estimated Creatinine Clearance: 30.6 mL/min (A) (by C-G formula based on SCr of 2.08 mg/dL (H)).  Assessment:   66 yr old female continues on Coumadin for afib.  Admitted 12/189 after fall. CT with no evidence of bleed.    INR is 2.00, low therapeutic. Slight trend down from 2.16 on admit; Coumadin 5 mg given last night.    Home Coumadin regimen: 5 mg daily execpt 7.5 mg on Wednesdays.  Goal of Therapy:  INR 2-3 Monitor platelets by anticoagulation protocol: Yes   Plan:   Coumadin 7.5 mg x 1 today, since 5 mg was given on 12/19 (Wednesday).  Daily PT/INR for now.  Arty Baumgartner, [place] Pager: [phone], or [phone] 11/25/2017,2:58 PM

## 2017-11-25 NOTE — Progress Notes (Signed)
PROGRESS NOTE    Peggy Chen  BHA:[id number] DOB: 1951-08-10 DOA: 11/24/2017 PCP: [name], MD   Chief Complaint  Patient presents with  . Fall    Brief Narrative:  HPI On 11/24/2017 by [name] Plan Peggy Chen is a 66 y.o. female with medical history significant of PAF on Coumadin, HTN, HLD; who presented with complaints of falls and weakness.  Last 2 days she is been having nausea and vomiting in 2-3 times per day unable to keep any significant amount of food or liquids down.  Seen yesterday by her PCP diagnosed with a urinary tract infection   for which she was started on Bactrim.  Reports taking 1 dose of Bactrim.  Over the course of today she had fallen at least 3 times that she relates to weakness and loss of balance.  She hit her head against a door hinge during 1 of the falls,  but denies any loss of consciousness.  Assessment & Plan   Severe sepsis secondary to urinary tract infection -Presented with fever, leukocytosis -Recently diagnosed with urinary tract infection as an outpatient and placed on Bactrim, however was unable to tolerate the medication due to nausea and vomiting -Blood and urine cultures pending -Continue tylenol PRN  -Continue IVF  Acute metabolic encephalopathy -appears to be have resolved -likely secondary to the above -CT head unremarkable   Acute kidney injury with left hydronephrosis -2015, last creatinine was 0.9. Currently creatinine 2.08 -Suspect secondary to medications versus dehydration from nausea and vomiting -Renal ultrasound: Mild left hydronephrosis. -Will continue IV fluids and monitor BMP -Wll obtain CT renal stone study  Nausea/vomiting -continue antiemetics PRN  Hypokalemia -Continue to replace and monitor BMP  Falls -Patient had multiple falls prior to admission, suspect secondary to dehydration from recent illness -PT consulted-recommended home health, rolling walker  Paroxysmal atrial  fibrillation -Currently rate and rhythm controlled -Continue flecainide and Coumadin per pharmacy  Diabetes mellitus, type II -Continue insulin sliding scale and CBG monitoring -Janumet held  Essential hypertension -Continue amlodipine, metoprolol   DVT Prophylaxis  Couamdin  Code Status: Full  Family Communication: None at bedside  Disposition Plan: Admitted. Continue to monitor sepsis and renal function  Consultants None  Procedures  Renal US  Antibiotics   Anti-infectives (From admission, onward)   Start     Dose/Rate Route Frequency Ordered Stop   11/25/17 2000  cefTRIAXone (ROCEPHIN) 1 g in dextrose 5 % 50 mL IVPB     1 g 100 mL/hr over 30 Minutes Intravenous Every 24 hours 11/24/17 2156 12/01/17 1959   11/24/17 2015  sulfamethoxazole-trimethoprim (BACTRIM DS,SEPTRA DS) 800-160 MG per tablet 1 tablet  Status:  Discontinued     1 tablet Oral  Once 11/24/17 2008 11/24/17 2008   11/24/17 2015  cefTRIAXone (ROCEPHIN) 1 g in dextrose 5 % 50 mL IVPB     1 g 100 mL/hr over 30 Minutes Intravenous  Once 11/24/17 2009 11/24/17 2056      Subjective:   [name] seen and examined today.  Feeling mildly better today. Complains of feeling very weak and fatigued. Has some nausea, denies any vomiting. Denies any current chest pain shortness of breath.  Objective:   Vitals:   11/25/17 0054 11/25/17 0106 11/25/17 0500 11/25/17 0650  BP: (!) 108/44  105/75   Pulse: 62  84   Resp: 18 20 18    Temp: 98.9 F (37.2 C)  (!) 101.7 F (38.7 C) 100.3 F (37.9 C)  TempSrc: Oral  SpO2:   100%   Weight: 99 kg (218 lb 4.1 oz)     Height: 5\' 5"  (1.651 m) 5\' 5"  (1.651 m)      Intake/Output Summary (Last 24 hours) at 11/25/2017 1416 Last data filed at 11/25/2017 0841 Gross per 24 hour  Intake 1080 ml  Output 500 ml  Net 580 ml   Filed Weights   11/24/17 1541 11/25/17 0054  Weight: 95.3 kg (210 lb) 99 kg (218 lb 4.1 oz)    Exam  General: Well developed, well  nourished, NAD, appears stated age  HEENT: [place], Right periorbital bruising, mucous membranes moist.   Cardiovascular: S1 S2 auscultated, no rubs, murmurs or gallops. Regular rate and rhythm.  Respiratory: Clear to auscultation bilaterally with equal chest rise  Abdomen: Soft, obese, nontender, nondistended, + bowel sounds  Extremities: warm dry without cyanosis clubbing or edema  Neuro: AAOx3, nonfocal  Skin: Without rashes exudates or nodules, multiple bruises  Psych: Normal affect and demeanor with intact judgement and insight   Data Reviewed: I have personally reviewed following labs and imaging studies  CBC: Recent Labs  Lab 11/24/17 1742 11/25/17 0417  WBC 13.1* 14.0*  NEUTROABS 11.7*  --   HGB 12.8 13.1  HCT 38.4 40.8  MCV 90.8 91.7  PLT 229 678   Basic Metabolic Panel: Recent Labs  Lab 11/24/17 1742 11/25/17 0417  NA 131* 134*  K 3.1* 3.1*  CL 96* 99*  CO2 25 24  GLUCOSE 207* 176*  BUN 27* 28*  CREATININE 1.95* 2.08*  CALCIUM 8.7* 8.8*   GFR: Estimated Creatinine Clearance: 30.6 mL/min (A) (by C-G formula based on SCr of 2.08 mg/dL (H)). Liver Function Tests: Recent Labs  Lab 11/24/17 1742  AST 44*  ALT 23  ALKPHOS 56  BILITOT 1.2  PROT 6.3*  ALBUMIN 3.2*   No results for input(s): LIPASE, AMYLASE in the last 168 hours. No results for input(s): AMMONIA in the last 168 hours. Coagulation Profile: Recent Labs  Lab 11/24/17 1742 11/25/17 0417  INR 2.16 2.00   Cardiac Enzymes: No results for input(s): CKTOTAL, CKMB, CKMBINDEX, TROPONINI in the last 168 hours. BNP (last 3 results) No results for input(s): PROBNP in the last 8760 hours. HbA1C: No results for input(s): HGBA1C in the last 72 hours. CBG: Recent Labs  Lab 11/25/17 0204 11/25/17 0615 11/25/17 1150  GLUCAP 201* 208* 194*   Lipid Profile: No results for input(s): CHOL, HDL, LDLCALC, TRIG, CHOLHDL, LDLDIRECT in the last 72 hours. Thyroid Function Tests: No results for  input(s): TSH, T4TOTAL, FREET4, T3FREE, THYROIDAB in the last 72 hours. Anemia Panel: No results for input(s): VITAMINB12, FOLATE, FERRITIN, TIBC, IRON, RETICCTPCT in the last 72 hours. Urine analysis:    Component Value Date/Time   COLORURINE YELLOW 11/24/2017 1906   APPEARANCEUR HAZY (A) 11/24/2017 1906   LABSPEC 1.018 11/24/2017 1906   PHURINE 5.0 11/24/2017 1906   GLUCOSEU NEGATIVE 11/24/2017 1906   HGBUR LARGE (A) 11/24/2017 [place] NEGATIVE 11/24/2017 1906   KETONESUR 5 (A) 11/24/2017 1906   PROTEINUR 100 (A) 11/24/2017 1906   NITRITE NEGATIVE 11/24/2017 1906   LEUKOCYTESUR NEGATIVE 11/24/2017 1906   Sepsis Labs: @LABRCNTIP (procalcitonin:4,lacticidven:4)  )No results found for this or any previous visit (from the past 240 hour(s)).    Radiology Studies: Ct Head Wo Contrast  Result Date: 11/24/2017 CLINICAL DATA:  Fall with hematoma to the forehead EXAM: CT HEAD WITHOUT CONTRAST CT CERVICAL SPINE WITHOUT CONTRAST TECHNIQUE: Multidetector CT imaging of the head  and cervical spine was performed following the standard protocol without intravenous contrast. Multiplanar CT image reconstructions of the cervical spine were also generated. COMPARISON:  None. FINDINGS: CT HEAD FINDINGS Brain: No acute territorial infarction, hemorrhage, or intracranial mass is visualized. Mild small vessel ischemic changes of the white matter. 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 Normal ventricle size Vascular: No hyperdense vessels.  Carotid artery calcification Skull: No fracture Sinuses/Orbits: Mild mucosal thickening in the maxillary and ethmoid sinuses. No acute orbital abnormality Other: Moderate right forehead hematoma and small posterior scalp swelling at the vertex CT CERVICAL SPINE FINDINGS Alignment: Straightening of the cervical spine. No subluxation. The set alignment within normal limits. Skull base and vertebrae: No acute fracture. No primary bone lesion or focal pathologic  process. Soft tissues and spinal canal: No prevertebral fluid or swelling. No visible canal hematoma. Disc levels: Mild degenerative changes at C4-C5 and C5-C6 with moderate changes at C6-C7. Upper chest: Negative. Other: Carotid artery calcification IMPRESSION: 1. No CT evidence for acute intracranial abnormality. Small vessel ischemic changes of the white matter. Moderate right forehead hematoma and small posterior scalp swelling 2. Straightening of the cervical spine with degenerative changes. No definite acute osseous abnormality. Electronically Signed   By: Donavan Foil M.D.   On: 11/24/2017 17:25   Ct Cervical Spine Wo Contrast  Result Date: 11/24/2017 CLINICAL DATA:  Fall with hematoma to the forehead EXAM: CT HEAD WITHOUT CONTRAST CT CERVICAL SPINE WITHOUT CONTRAST TECHNIQUE: Multidetector CT imaging of the head and cervical spine was performed following the standard protocol without intravenous contrast. Multiplanar CT image reconstructions of the cervical spine were also generated. COMPARISON:  None. FINDINGS: CT HEAD FINDINGS Brain: No acute territorial infarction, hemorrhage, or intracranial mass is visualized. Mild small vessel ischemic changes of the white matter. 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 Normal ventricle size Vascular: No hyperdense vessels.  Carotid artery calcification Skull: No fracture Sinuses/Orbits: Mild mucosal thickening in the maxillary and ethmoid sinuses. No acute orbital abnormality Other: Moderate right forehead hematoma and small posterior scalp swelling at the vertex CT CERVICAL SPINE FINDINGS Alignment: Straightening of the cervical spine. No subluxation. The set alignment within normal limits. Skull base and vertebrae: No acute fracture. No primary bone lesion or focal pathologic process. Soft tissues and spinal canal: No prevertebral fluid or swelling. No visible canal hematoma. Disc levels: Mild degenerative changes at C4-C5 and C5-C6 with moderate changes at  C6-C7. Upper chest: Negative. Other: Carotid artery calcification IMPRESSION: 1. No CT evidence for acute intracranial abnormality. Small vessel ischemic changes of the white matter. Moderate right forehead hematoma and small posterior scalp swelling 2. Straightening of the cervical spine with degenerative changes. No definite acute osseous abnormality. Electronically Signed   By: Donavan Foil M.D.   On: 11/24/2017 17:25   US Renal  Result Date: 11/24/2017 CLINICAL DATA:  Acute kidney injury. EXAM: RENAL / URINARY TRACT ULTRASOUND COMPLETE COMPARISON:  None. FINDINGS: Right Kidney: Length: 11.6 cm. Echogenicity within normal limits. No mass or hydronephrosis visualized. Left Kidney: Length: 12.8 cm. Mild hydronephrosis. Subcentimeter cyst in the upper pole. 2.8 cm exophytic cyst from the mid kidney. No evidence of solid lesion. Bladder: Only minimally distended.  Neither ureteral jet was visualized. Incidental note of hepatic steatosis. IMPRESSION: 1. Mild left hydronephrosis, cause not visualized. 2. Left renal cysts. 3. Normal sonographic appearance of the right kidney. Electronically Signed   By: Jeb Levering M.D.   On: 11/24/2017 23:20   Dg Chest Portable 1 View  Result Date: 11/24/2017 CLINICAL DATA:  Multiple falls EXAM: PORTABLE CHEST 1 VIEW COMPARISON:  04/09/2014 FINDINGS: No consolidation or effusion. Stable cardiomediastinal silhouette with atherosclerosis. No pneumothorax. Rotation felt to augment the mediastinal contour. IMPRESSION: 1. No radiographic evidence for acute cardiopulmonary abnormality 2. Generous mediastinal contour is felt augmented by patient rotation Electronically Signed   By: Donavan Foil M.D.   On: 11/24/2017 21:06   Dg Hip Unilat With Pelvis 2-3 Views Left  Result Date: 11/24/2017 CLINICAL DATA:  Left hip pain secondary to a fall. EXAM: DG HIP (WITH OR WITHOUT PELVIS) 2-3V LEFT COMPARISON:  None. FINDINGS: There is no evidence of hip fracture or dislocation.  There is no evidence of arthropathy or other focal bone abnormality. IMPRESSION: Negative. Electronically Signed   By: Lorriane Shire M.D.   On: 11/24/2017 16:54     Scheduled Meds: . allopurinol  100 mg Oral Daily  . amLODipine  5 mg Oral Daily  . aspirin  81 mg Oral Daily  . citalopram  10 mg Oral Daily  . flecainide  150 mg Oral BID  . insulin aspart  0-5 Units Subcutaneous QHS  . insulin aspart  0-9 Units Subcutaneous TID WC  . metoprolol  200 mg Oral Daily  . Warfarin - Pharmacist Dosing Inpatient   Does not apply q1800   Continuous Infusions: . sodium chloride 100 mL/hr at 11/25/17 0244  . cefTRIAXone (ROCEPHIN)  IV       LOS: 1 day   Time Spent in minutes   30 minutes  Viera Okonski D.O. on 11/25/2017 at 2:16 PM  Between 7am to 7pm - Pager - [phone]  After 7pm go to [email or web address] - password [email or web address]  And look for the night coverage person covering for me after hours  [facility] Office  [phone]

## 2017-11-25 NOTE — Progress Notes (Signed)
Inpatient Diabetes Program Recommendations  AACE/ADA: New Consensus Statement on Inpatient Glycemic Control (2015)  Target Ranges:  Prepandial:   less than 140 mg/dL      Peak postprandial:   less than 180 mg/dL (1-2 hours)      Critically ill patients:  140 - 180 mg/dL   Lab Results  Component Value Date   GLUCAP 208 (H) 11/25/2017    Review of Glycemic Control Results for Peggy Chen, Peggy Chen (MRN [id number]) as of 11/25/2017 09:30  Ref. Range 11/25/2017 02:04 11/25/2017 06:15  Glucose-Capillary Latest Ref Range: 65 - 99 mg/dL 201 (H) 208 (H)   Diabetes history: DM2 Outpatient Diabetes medications: Janumet 50-1gm bid  Current orders for Inpatient glycemic control: Novolog correction sensitive tid + 0-5 units hs  Inpatient Diabetes Program Recommendations:   While orals held in the hospital, -Lantus 19 units daily (0.2 units/kg x 99 kg) -A1c to determine prehospital glycemic control  Thank you, Nani Gasser. Deanie Jupiter, RN, MSN, CDE  Diabetes Coordinator Inpatient Glycemic Control Team Team Pager [phone] (8am-5pm) 11/25/2017 9:33 AM

## 2017-11-25 NOTE — Progress Notes (Signed)
Pt admitted from ED via stretcher by nurse and DX'd with sepsis secondary to UTI. Pt is A&OX3, breathing equal and unlabored, in NAD. Pt denied pain. Adm VSS were taken. Pt placed on tele. MAE. Pt has hematoma on right forehead, ecchymosis on right hand. Inner right eye is slightly swollen and gray.

## 2017-11-26 ENCOUNTER — Inpatient Hospital Stay (HOSPITAL_COMMUNITY): Payer: Medicare Other | Admitting: Anesthesiology

## 2017-11-26 ENCOUNTER — Encounter (HOSPITAL_COMMUNITY)
Admission: EM | Disposition: A | Discharge status: Home Health | Payer: Self-pay | Source: Home / Self Care | Attending: Internal Medicine

## 2017-11-26 ENCOUNTER — Inpatient Hospital Stay (HOSPITAL_COMMUNITY): Payer: Medicare Other

## 2017-11-26 HISTORY — PX: CYSTOSCOPY/URETEROSCOPY/HOLMIUM LASER/STENT PLACEMENT: SHX6546

## 2017-11-26 LAB — GLUCOSE, CAPILLARY
GLUCOSE-CAPILLARY: 208 mg/dL — AB (ref 65–99)
GLUCOSE-CAPILLARY: 262 mg/dL — AB (ref 65–99)
Glucose-Capillary: 197 mg/dL — ABNORMAL HIGH (ref 65–99)
Glucose-Capillary: 158 mg/dL — ABNORMAL HIGH (ref 65–99)
Glucose-Capillary: 132 mg/dL — ABNORMAL HIGH (ref 65–99)
Glucose-Capillary: 127 mg/dL — ABNORMAL HIGH (ref 65–99)

## 2017-11-26 LAB — MAGNESIUM: Magnesium: 1.6 mg/dL — ABNORMAL LOW (ref 1.7–2.4)

## 2017-11-26 LAB — BASIC METABOLIC PANEL
Anion gap: 8 (ref 5–15)
BUN: 25 mg/dL — ABNORMAL HIGH (ref 6–20)
CALCIUM: 7.8 mg/dL — AB (ref 8.9–10.3)
CO2: 21 mmol/L — AB (ref 22–32)
CREATININE: 1.59 mg/dL — AB (ref 0.44–1.00)
Chloride: 101 mmol/L (ref 101–111)
GFR calc non Af Amer: 33 mL/min — ABNORMAL LOW (ref >60)
GFR, EST AFRICAN AMERICAN: 38 mL/min — AB (ref >60)
Glucose, Bld: 196 mg/dL — ABNORMAL HIGH (ref 65–99)
Potassium: 2.9 mmol/L — ABNORMAL LOW (ref 3.5–5.1)
Sodium: 130 mmol/L — ABNORMAL LOW (ref 135–145)

## 2017-11-26 LAB — PROTIME-INR
INR: 2.64
PROTHROMBIN TIME: 27.9 s — AB (ref 11.4–15.2)

## 2017-11-26 LAB — CBC
HEMATOCRIT: 33.5 % — AB (ref 36.0–46.0)
Hemoglobin: 10.9 g/dL — ABNORMAL LOW (ref 12.0–15.0)
MCH: 29.3 pg (ref 26.0–34.0)
MCHC: 32.5 g/dL (ref 30.0–36.0)
MCV: 90.1 fL (ref 78.0–100.0)
Platelets: 179 10*3/uL (ref 150–400)
RBC: 3.72 MIL/uL — ABNORMAL LOW (ref 3.87–5.11)
RDW: 13.4 % (ref 11.5–15.5)
WBC: 12.5 10*3/uL — ABNORMAL HIGH (ref 4.0–10.5)

## 2017-11-26 SURGERY — CYSTOSCOPY/URETEROSCOPY/HOLMIUM LASER/STENT PLACEMENT
Anesthesia: General | Site: Urethra | Laterality: Left

## 2017-11-26 MED ORDER — FENTANYL CITRATE (PF) 100 MCG/2ML IJ SOLN
INTRAMUSCULAR | Status: AC
  Filled 2017-11-26: qty 2

## 2017-11-26 MED ORDER — PROPOFOL 10 MG/ML IV BOLUS
INTRAVENOUS | Status: DC | PRN
  Administered 2017-11-26: 150 mg via INTRAVENOUS

## 2017-11-26 MED ORDER — FENTANYL CITRATE (PF) 250 MCG/5ML IJ SOLN
INTRAMUSCULAR | Status: DC | PRN
  Administered 2017-11-26: 100 ug via INTRAVENOUS

## 2017-11-26 MED ORDER — LACTATED RINGERS IV SOLN
INTRAVENOUS | Status: DC | PRN
  Administered 2017-11-26: 16:00:00 via INTRAVENOUS

## 2017-11-26 MED ORDER — ONDANSETRON HCL 4 MG/2ML IJ SOLN: 4.0000 mg | Freq: Once | INTRAMUSCULAR | Status: DC | PRN

## 2017-11-26 MED ORDER — MAGNESIUM SULFATE 2 GM/50ML IV SOLN
2.0000 g | Freq: Once | INTRAVENOUS | Status: DC
  Filled 2017-11-26 (×2): qty 50

## 2017-11-26 MED ORDER — OXYCODONE HCL 5 MG PO TABS: 5.0000 mg | ORAL_TABLET | Freq: Once | ORAL | Status: DC | PRN

## 2017-11-26 MED ORDER — LACTATED RINGERS IV SOLN: INTRAVENOUS | Status: DC

## 2017-11-26 MED ORDER — FENTANYL CITRATE (PF) 100 MCG/2ML IJ SOLN
25.0000 ug | INTRAMUSCULAR | Status: DC | PRN
  Administered 2017-11-26 (×2): 50 ug via INTRAVENOUS

## 2017-11-26 MED ORDER — WARFARIN SODIUM 5 MG PO TABS: 5.0000 mg | ORAL_TABLET | Freq: Once | ORAL | Status: DC

## 2017-11-26 MED ORDER — PROPOFOL 10 MG/ML IV BOLUS
INTRAVENOUS | Status: AC
  Filled 2017-11-26: qty 20

## 2017-11-26 MED ORDER — EPHEDRINE SULFATE-NACL 50-0.9 MG/10ML-% IV SOSY
PREFILLED_SYRINGE | INTRAVENOUS | Status: DC | PRN
  Administered 2017-11-26 (×2): 10 mg via INTRAVENOUS

## 2017-11-26 MED ORDER — LIDOCAINE 2% (20 MG/ML) 5 ML SYRINGE
INTRAMUSCULAR | Status: AC
  Filled 2017-11-26: qty 5

## 2017-11-26 MED ORDER — POTASSIUM CHLORIDE CRYS ER 20 MEQ PO TBCR
40.0000 meq | EXTENDED_RELEASE_TABLET | Freq: Two times a day (BID) | ORAL | Status: AC
  Administered 2017-11-26: 40 meq via ORAL
  Filled 2017-11-26: qty 2

## 2017-11-26 MED ORDER — SUCCINYLCHOLINE CHLORIDE 200 MG/10ML IV SOSY
PREFILLED_SYRINGE | INTRAVENOUS | Status: DC | PRN
  Administered 2017-11-26: 100 mg via INTRAVENOUS

## 2017-11-26 MED ORDER — OXYCODONE HCL 5 MG/5ML PO SOLN
5.0000 mg | Freq: Once | ORAL | Status: DC | PRN
  Filled 2017-11-26: qty 5

## 2017-11-26 MED ORDER — LIDOCAINE 2% (20 MG/ML) 5 ML SYRINGE
INTRAMUSCULAR | Status: DC | PRN
  Administered 2017-11-26: 75 mg via INTRAVENOUS

## 2017-11-26 MED ORDER — SODIUM CHLORIDE 0.9 % IR SOLN
Status: DC | PRN
  Administered 2017-11-26: 1000 mL

## 2017-11-26 MED ORDER — IOHEXOL 300 MG/ML  SOLN: INTRAMUSCULAR | Status: DC | PRN

## 2017-11-26 MED ORDER — SODIUM CHLORIDE 0.9 % IR SOLN
Status: DC | PRN
  Administered 2017-11-26: 3000 mL

## 2017-11-26 SURGICAL SUPPLY — 18 items
BAG URO CATCHER STRL LF (MISCELLANEOUS) ×3 IMPLANT
BASKET ZERO TIP NITINOL 2.4FR (BASKET) IMPLANT
CATH INTERMIT  6FR 70CM (CATHETERS) IMPLANT
CLOTH BEACON ORANGE TIMEOUT ST (SAFETY) IMPLANT
COVER FOOTSWITCH UNIV (MISCELLANEOUS) IMPLANT
COVER SURGICAL LIGHT HANDLE (MISCELLANEOUS) ×3 IMPLANT
FIBER LASER FLEXIVA 365 (UROLOGICAL SUPPLIES) IMPLANT
FIBER LASER TRAC TIP (UROLOGICAL SUPPLIES) IMPLANT
GLOVE BIOGEL M STRL SZ7.5 (GLOVE) ×3 IMPLANT
GOWN STRL REUS W/TWL LRG LVL3 (GOWN DISPOSABLE) ×6 IMPLANT
GUIDEWIRE ANG ZIPWIRE 038X150 (WIRE) ×3 IMPLANT
GUIDEWIRE STR DUAL SENSOR (WIRE) ×3 IMPLANT
MANIFOLD NEPTUNE II (INSTRUMENTS) ×3 IMPLANT
PACK CYSTO (CUSTOM PROCEDURE TRAY) ×3 IMPLANT
SHEATH URETERAL 12FRX35CM (MISCELLANEOUS) IMPLANT
STENT URET 6FRX24 CONTOUR (STENTS) ×3 IMPLANT
TUBING CONNECTING 10 (TUBING) ×2 IMPLANT
TUBING CONNECTING 10' (TUBING) ×1

## 2017-11-26 NOTE — Progress Notes (Signed)
[facility] at bedside. Pt will be tx to [id number].  Report given to RN Ulice Dash

## 2017-11-26 NOTE — Progress Notes (Signed)
Pt transferred to [facility] for surgery. Pt is transferred from  [facility] by care link, Nurse called Delia Chimes at [facility] to give report, Nurse is busy and will call back. Consent signed.

## 2017-11-26 NOTE — Care Management Note (Signed)
Case Management Note  Patient Details  Name: Peggy Chen MRN: [id number] Date of Birth: February 20, 1951  Subjective/Objective:      Pt admitted with UTI - falls              Action/Plan:   PTA independent from home alone - however family/friends will be with pt post discharge.  Pt states that she is usually "vibrant" she feels that she fell in the home because she wasn't able to eat due to sickness prior to admit.  Pt has PCP and denied barriers to paying for medications.  PT eval ordered.  CM will continue to follow for discharge needs    Expected Discharge Date:  11/25/17               Expected Discharge Plan:  [place]  In-House Referral:     Discharge planning Services  CM Consult  Post Acute Care Choice:    Choice offered to:     DME Arranged:    DME Agency:     HH Arranged:    HH Agency:     Status of Service:     If discussed at [employer], dates discussed:    Additional Comments: Pt declined [facility] as recommended - pt is interested in DME - CM offered choice for equipment - pt chose [facility] - agency contacted and referral accepted Peggy Labrador, RN 11/26/2017, 11:55 AM

## 2017-11-26 NOTE — Consult Note (Signed)
[facility] CM Primary Care Navigator  11/26/2017  Peggy Chen 09-14-1951 [id number]   Met with patient at the bedside to identify possible discharge needs.  Patient reportshavingrecurrent falls, nausea/ vomiting and weakness that had led to thisadmission.  Patient endorses Dr.Ravisankar Avva with [facility] as the primary care provider.   [facility] [address] [facility] [address] to obtain medications without difficulty.   Patient reports that she has been [facility] use of "pill box" system filled once a week.  Patient verbalized that she was driving prior to admission but her friends Ubaldo Glassing and Rodena Piety) will be able to provide transportation to her doctors'appointments after discharge.   Patient mentioned that she lives alone and has no local support from family members. She plans to get a "life alert' as stated. She also verbalized that her friends can assist with care needs at home if needed. She reports that her [place] friends Gerald Stabs and Ron) are "on call' in case she will be needing any help at home.  Anticipated discharge plan is homewith home health servicesper therapy recommendation.  Patientexpressedunderstanding to call primary care provider's office for a post discharge follow-up appointment within a week or sooner if needs arise.Patient letter (with PCP's contact number) was provided as a reminder.  Explained to patientabout [facility] CM services available for health management at home butshe denies anycurrent needs or concernsfor now and states that she manages diabetes with diet, medication, close monitoring and follow-up with primary care provider when needed. She reports that most recent A1C taken on 10/18 was 7.2. Patient voiced understandingto seek referral from primary care provider to [facility] care management if necessary and appropriate  for services in the future.  Mentioned to patient about diabetic classes she can attend to and be referred by primary care provider to it if needed.  [facility] care management information provided for future needs that may arise.  Patienthad verbally agreed and opted for EMMI calls to follow-upwithher recovery at home.  Referral made forEMMI General calls after discharge.   For additional questions please contact:  Edwena Felty A. Trenyce Loera, BSN, RN-BC [facility] PRIMARY CARE Navigator Cell: [phone]

## 2017-11-26 NOTE — Transfer of Care (Signed)
Immediate Anesthesia Transfer of Care Note  Patient: Peggy Chen  Procedure(s) Performed: Procedure(s): CYSTOSCOPY/STENT PLACEMENT (Left)  Patient Location: PACU  Anesthesia Type:General  Level of Consciousness:  sedated, patient cooperative and responds to stimulation  Airway & Oxygen Therapy:Patient Spontanous Breathing and Patient connected to face mask oxgen  Post-op Assessment:  Report given to PACU RN and Post -op Vital signs reviewed and stable  Post vital signs:  Reviewed and stable  Last Vitals:  Vitals:   11/26/17 1527 11/26/17 1655  BP: (!) 120/58   Pulse: (!) 56 63  Resp: 18 (!) 27  Temp: 37.3 C (P) 37.3 C  SpO2: 158% 68%    Complications: No apparent anesthesia complications

## 2017-11-26 NOTE — Progress Notes (Signed)
ANTICOAGULATION CONSULT NOTE - Follow Up Consult  Pharmacy Consult for Coumadin Indication: atrial fibrillation  Patient Measurements: Height: 5\' 5"  (165.1 cm) Weight: 218 lb 4.1 oz (99 kg) IBW/kg (Calculated) : 57  Vital Signs: Temp: 99.6 F (37.6 C) (12/21 0506) Temp Source: Oral (12/21 0506) BP: 131/53 (12/21 0506) Pulse Rate: 71 (12/21 0506)  Labs: Recent Labs    11/24/17 1742 11/25/17 0417 11/26/17 0306  HGB 12.8 13.1 10.9*  HCT 38.4 40.8 33.5*  PLT 229 232 179  LABPROT 23.9* 22.5* 27.9*  INR 2.16 2.00 2.64  CREATININE 1.95* 2.08* 1.59*    Estimated Creatinine Clearance: 40 mL/min (A) (by C-G formula based on SCr of 1.59 mg/dL (H)).  Assessment: 66 yr old female continues on Coumadin for afib.  Admitted after fall- CT with no evidence of bleed.  INR 2.16 on admit on home dose of 5mg  daily except 7.5mg  on Wednesdays.  INR remains in range at 2.64 this morning. Hgb and plts dropped, however no bleeding noted and patient is on high rate of MIVF (NS @ 125mL/hr), so suspect drop is dilutional.  Goal of Therapy:  INR 2-3 Monitor platelets by anticoagulation protocol: Yes   Plan:  Warfarin 5mg  po x1 tonight Daily INR for now Follow for s/s bleeding  Dalicia Kisner D. Mykale Gandolfo, PharmD, [place] Clinical Pharmacist Clinical Phone for 11/26/2017 until 3:30pm: [phone] If after 3:30pm, please call main pharmacy at [phone] 11/26/2017 10:08 AM

## 2017-11-26 NOTE — Anesthesia Postprocedure Evaluation (Signed)
Anesthesia Post Note  Patient: Peggy Chen  Procedure(s) Performed: CYSTOSCOPY/STENT PLACEMENT (Left Urethra)     Patient location during evaluation: PACU Anesthesia Type: General Level of consciousness: awake, awake and alert and oriented Pain management: pain level controlled Vital Signs Assessment: post-procedure vital signs reviewed and stable Respiratory status: spontaneous breathing, nonlabored ventilation and respiratory function stable Cardiovascular status: blood pressure returned to baseline Anesthetic complications: no    Last Vitals:  Vitals:   11/26/17 1740 11/26/17 1745  BP:  107/62  Pulse: (!) 56 (!) 56  Resp: 16 17  Temp:    SpO2: 100% 100%    Last Pain:  Vitals:   11/26/17 1745  TempSrc:   PainSc: 3                  Bernadene Garside COKER

## 2017-11-26 NOTE — Anesthesia Preprocedure Evaluation (Addendum)
Anesthesia Evaluation  Patient identified by MRN, date of birth, ID band Patient awake    Reviewed: Allergy & Precautions, NPO status , Patient's Chart, lab work & pertinent test results  Airway Mallampati: III  TM Distance: >3 FB Neck ROM: Full    Dental  (+) Teeth Intact, Dental Advisory Given   Pulmonary former smoker,    breath sounds clear to auscultation       Cardiovascular hypertension,  Rhythm:Regular Rate:Normal     Neuro/Psych    GI/Hepatic   Endo/Other  diabetes  Renal/GU      Musculoskeletal   Abdominal (+) + obese,   Peds  Hematology   Anesthesia Other Findings Bilateral peri-orbital ecchymosis  Reproductive/Obstetrics                             Anesthesia Physical Anesthesia Plan  ASA: III  Anesthesia Plan: General   Post-op Pain Management:    Induction: Intravenous  PONV Risk Score and Plan: Ondansetron, Midazolam and Metaclopromide  Airway Management Planned: Oral ETT  Additional Equipment:   Intra-op Plan:   Post-operative Plan: Extubation in OR  Informed Consent: I have reviewed the patients History and Physical, chart, labs and discussed the procedure including the risks, benefits and alternatives for the proposed anesthesia with the patient or authorized representative who has indicated his/her understanding and acceptance.   Dental advisory given  Plan Discussed with: CRNA and Anesthesiologist  Anesthesia Plan Comments:         Anesthesia Quick Evaluation

## 2017-11-26 NOTE — Op Note (Signed)
Preoperative diagnosis:  1. Left ureteral stone with febrile UTI   Postoperative diagnosis:  1. Left ureteral stone with febrile UTI   Procedure:  1. Cystoscopy 2. Left ureteral stent placement (6 x 24 - no string)  Surgeon: Roxy Horseman, Brooke Bonito. M.D.  Anesthesia: General  Complications: None  EBL: Minimal  Specimens: None  Indication: Peggy Chen is a 66 y.o. patient with a 5 mm left ureteral stone and febrile UTI. After reviewing the management options for treatment, he elected to proceed with the above surgical procedure(s). We have discussed the potential benefits and risks of the procedure, side effects of the proposed treatment, the likelihood of the patient achieving the goals of the procedure, and any potential problems that might occur during the procedure or recuperation. Informed consent has been obtained.  Description of procedure:  The patient was taken to the operating room and general anesthesia was induced.  The patient was placed in the dorsal lithotomy position, prepped and draped in the usual sterile fashion, and preoperative antibiotics were administered. A preoperative time-out was performed.   Cystourethroscopy was performed.  The patient's urethra was examined and was normal. The bladder was then systematically examined in its entirety. There was no evidence for any bladder tumors, stones, or other mucosal pathology.    A 0.38 sensor guidewire was then advanced up the left ureter into the renal pelvis under fluoroscopic guidance.  The wire was then backloaded through the cystoscope and a ureteral stent was advance over the wire using Seldinger technique.  The stent was positioned appropriately under fluoroscopic and cystoscopic guidance.  The wire was then removed with an adequate stent curl noted in the renal pelvis as well as in the bladder.  The bladder was then emptied and the procedure ended.  The patient appeared to tolerate the procedure well and  without complications.  The patient was able to be awakened and transferred to the recovery unit in satisfactory condition.    Pryor Curia MD

## 2017-11-26 NOTE — Progress Notes (Signed)
PROGRESS NOTE    Peggy Chen  ZOX:[id number] DOB: August 20, 1951 DOA: 11/24/2017 PCP: [name], MD   Chief Complaint  Patient presents with  . Fall    Brief Narrative:  HPI On 11/24/2017 by [name] Plan Peggy Chen is a 66 y.o. female with medical history significant of PAF on Coumadin, HTN, HLD; who presented with complaints of falls and weakness.  Last 2 days she is been having nausea and vomiting in 2-3 times per day unable to keep any significant amount of food or liquids down.  Seen yesterday by her PCP diagnosed with a urinary tract infection   for which she was started on Bactrim.  Reports taking 1 dose of Bactrim.  Over the course of today she had fallen at least 3 times that she relates to weakness and loss of balance.  She hit her head against a door hinge during 1 of the falls,  but denies any loss of consciousness.  Assessment & Plan   Severe sepsis secondary to urinary tract infection -Presented with fever, leukocytosis- improving  -Recently diagnosed with urinary tract infection as an outpatient and placed on Bactrim, however was unable to tolerate the medication due to nausea and vomiting -Blood cultures show no growth to date -urine culture pending -Continue tylenol PRN  -Continue IVF  Acute metabolic encephalopathy -appears to be have resolved -likely secondary to the above -CT head unremarkable   Acute kidney injury with left hydronephrosis -2015, last creatinine was 0.9. Currently creatinine 2.08 -Suspect secondary to medications versus dehydration from nausea and vomiting -Renal ultrasound: Mild left hydronephrosis. -CT renal stone study: Mild left hydronephrosis with stranding about the left kidney and ureter due to a 0.5 cm distal left ureteral stone -Creatinine improving, currently 1.59 -Continue IVF and monitor BMP -Will discus with urology  Nausea/vomiting -continue antiemetics PRN  Hypokalemia/hypomagnesemia -Replace and continue  to monitor BMP and magnesium  Falls -Patient had multiple falls prior to admission, suspect secondary to dehydration from recent illness -PT consulted-recommended home health, rolling walker  Paroxysmal atrial fibrillation -Currently rate and rhythm controlled -Continue flecainide and Coumadin per pharmacy  Diabetes mellitus, type II -Continue insulin sliding scale and CBG monitoring -Janumet held  Essential hypertension -Continue amlodipine, metoprolol   Abdominal hernia -noted on CT renal study -patient aware  DVT Prophylaxis  Couamdin  Code Status: Full  Family Communication: None at bedside  Disposition Plan: Admitted. Continue to monitor sepsis and renal function  Consultants None  Procedures  Renal US  Antibiotics   Anti-infectives (From admission, onward)   Start     Dose/Rate Route Frequency Ordered Stop   11/25/17 2000  cefTRIAXone (ROCEPHIN) 1 g in dextrose 5 % 50 mL IVPB     1 g 100 mL/hr over 30 Minutes Intravenous Every 24 hours 11/24/17 2156 12/01/17 1959   11/24/17 2015  sulfamethoxazole-trimethoprim (BACTRIM DS,SEPTRA DS) 800-160 MG per tablet 1 tablet  Status:  Discontinued     1 tablet Oral  Once 11/24/17 2008 11/24/17 2008   11/24/17 2015  cefTRIAXone (ROCEPHIN) 1 g in dextrose 5 % 50 mL IVPB     1 g 100 mL/hr over 30 Minutes Intravenous  Once 11/24/17 2009 11/24/17 2056      Subjective:   Peggy Chen seen and examined today.  Patient feeling mildly better today. Denies any current chest pain, short of breath, abdominal pain, nausea or vomiting, diarrhea or constipation. Does continue to feel somewhat weak and fatigued and shaky.   Objective:  Vitals:   11/25/17 2159 11/26/17 0115 11/26/17 0506 11/26/17 1046  BP: 112/60 124/62 (!) 131/53 (!) 130/55  Pulse: (!) 59 62 71 65  Resp: 20 20 20 20   Temp: 98.3 F (36.8 C) 97.9 F (36.6 C) 99.6 F (37.6 C) 98.2 F (36.8 C)  TempSrc: Oral Oral Oral Oral  SpO2: 98% 100% 95% 99%  Weight:       Height:        Intake/Output Summary (Last 24 hours) at 11/26/2017 1054 Last data filed at 11/26/2017 0527 Gross per 24 hour  Intake 240 ml  Output -  Net 240 ml   Filed Weights   11/24/17 1541 11/25/17 0054  Weight: 95.3 kg (210 lb) 99 kg (218 lb 4.1 oz)   Exam  General: Well developed, well nourished, NAD, appears stated age  69: Posey, right periorbital bruising, mucous membranes moist.   Neck: Supple, no JVD, no masses  Cardiovascular: S1 S2 auscultated, no rubs, murmurs or gallops. Regular rate and rhythm.  Respiratory: Clear to auscultation bilaterally with equal chest rise  Abdomen: Soft, obese, nontender, nondistended, + bowel sounds  Extremities: warm dry without cyanosis clubbing or edema  Neuro: AAOx3, nonfocal  Psych: pleasant, appropriate mood and affect  Data Reviewed: I have personally reviewed following labs and imaging studies  CBC: Recent Labs  Lab 11/24/17 1742 11/25/17 0417 11/26/17 0306  WBC 13.1* 14.0* 12.5*  NEUTROABS 11.7*  --   --   HGB 12.8 13.1 10.9*  HCT 38.4 40.8 33.5*  MCV 90.8 91.7 90.1  PLT 229 232 678   Basic Metabolic Panel: Recent Labs  Lab 11/24/17 1742 11/25/17 0417 11/26/17 0306  NA 131* 134* 130*  K 3.1* 3.1* 2.9*  CL 96* 99* 101  CO2 25 24 21*  GLUCOSE 207* 176* 196*  BUN 27* 28* 25*  CREATININE 1.95* 2.08* 1.59*  CALCIUM 8.7* 8.8* 7.8*  MG  --   --  1.6*   GFR: Estimated Creatinine Clearance: 40 mL/min (A) (by C-G formula based on SCr of 1.59 mg/dL (H)). Liver Function Tests: Recent Labs  Lab 11/24/17 1742  AST 44*  ALT 23  ALKPHOS 56  BILITOT 1.2  PROT 6.3*  ALBUMIN 3.2*   No results for input(s): LIPASE, AMYLASE in the last 168 hours. No results for input(s): AMMONIA in the last 168 hours. Coagulation Profile: Recent Labs  Lab 11/24/17 1742 11/25/17 0417 11/26/17 0306  INR 2.16 2.00 2.64   Cardiac Enzymes: No results for input(s): CKTOTAL, CKMB, CKMBINDEX, TROPONINI in the last 168  hours. BNP (last 3 results) No results for input(s): PROBNP in the last 8760 hours. HbA1C: No results for input(s): HGBA1C in the last 72 hours. CBG: Recent Labs  Lab 11/25/17 0615 11/25/17 1150 11/25/17 1650 11/25/17 2136 11/26/17 0559  GLUCAP 208* 194* 197* 212* 197*   Lipid Profile: No results for input(s): CHOL, HDL, LDLCALC, TRIG, CHOLHDL, LDLDIRECT in the last 72 hours. Thyroid Function Tests: No results for input(s): TSH, T4TOTAL, FREET4, T3FREE, THYROIDAB in the last 72 hours. Anemia Panel: No results for input(s): VITAMINB12, FOLATE, FERRITIN, TIBC, IRON, RETICCTPCT in the last 72 hours. Urine analysis:    Component Value Date/Time   COLORURINE YELLOW 11/24/2017 1906   APPEARANCEUR HAZY (A) 11/24/2017 1906   LABSPEC 1.018 11/24/2017 1906   PHURINE 5.0 11/24/2017 1906   GLUCOSEU NEGATIVE 11/24/2017 1906   HGBUR LARGE (A) 11/24/2017 [place] NEGATIVE 11/24/2017 1906   KETONESUR 5 (A) 11/24/2017 1906   PROTEINUR  100 (A) 11/24/2017 1906   NITRITE NEGATIVE 11/24/2017 1906   LEUKOCYTESUR NEGATIVE 11/24/2017 1906   Sepsis Labs: @LABRCNTIP (procalcitonin:4,lacticidven:4)  ) Recent Results (from the past 240 hour(s))  Blood culture (routine x 2)     Status: None (Preliminary result)   Collection Time: 11/24/17  9:02 PM  Result Value Ref Range Status   Specimen Description BLOOD RIGHT ARM  Final   Special Requests   Final    BOTTLES DRAWN AEROBIC AND ANAEROBIC Blood Culture adequate volume   Culture NO GROWTH < 24 HOURS  Final   Report Status PENDING  Incomplete  Blood culture (routine x 2)     Status: None (Preliminary result)   Collection Time: 11/24/17  9:11 PM  Result Value Ref Range Status   Specimen Description BLOOD RIGHT ANTECUBITAL  Final   Special Requests   Final    BOTTLES DRAWN AEROBIC AND ANAEROBIC Blood Culture adequate volume   Culture NO GROWTH < 24 HOURS  Final   Report Status PENDING  Incomplete      Radiology Studies: Ct Head Wo  Contrast  Result Date: 11/24/2017 CLINICAL DATA:  Fall with hematoma to the forehead EXAM: CT HEAD WITHOUT CONTRAST CT CERVICAL SPINE WITHOUT CONTRAST TECHNIQUE: Multidetector CT imaging of the head and cervical spine was performed following the standard protocol without intravenous contrast. Multiplanar CT image reconstructions of the cervical spine were also generated. COMPARISON:  None. FINDINGS: CT HEAD FINDINGS Brain: No acute territorial infarction, hemorrhage, or intracranial mass is visualized. Mild small vessel ischemic changes of the white matter. 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 Normal ventricle size Vascular: No hyperdense vessels.  Carotid artery calcification Skull: No fracture Sinuses/Orbits: Mild mucosal thickening in the maxillary and ethmoid sinuses. No acute orbital abnormality Other: Moderate right forehead hematoma and small posterior scalp swelling at the vertex CT CERVICAL SPINE FINDINGS Alignment: Straightening of the cervical spine. No subluxation. The set alignment within normal limits. Skull base and vertebrae: No acute fracture. No primary bone lesion or focal pathologic process. Soft tissues and spinal canal: No prevertebral fluid or swelling. No visible canal hematoma. Disc levels: Mild degenerative changes at C4-C5 and C5-C6 with moderate changes at C6-C7. Upper chest: Negative. Other: Carotid artery calcification IMPRESSION: 1. No CT evidence for acute intracranial abnormality. Small vessel ischemic changes of the white matter. Moderate right forehead hematoma and small posterior scalp swelling 2. Straightening of the cervical spine with degenerative changes. No definite acute osseous abnormality. Electronically Signed   By: Donavan Foil M.D.   On: 11/24/2017 17:25   Ct Cervical Spine Wo Contrast  Result Date: 11/24/2017 CLINICAL DATA:  Fall with hematoma to the forehead EXAM: CT HEAD WITHOUT CONTRAST CT CERVICAL SPINE WITHOUT CONTRAST TECHNIQUE: Multidetector CT  imaging of the head and cervical spine was performed following the standard protocol without intravenous contrast. Multiplanar CT image reconstructions of the cervical spine were also generated. COMPARISON:  None. FINDINGS: CT HEAD FINDINGS Brain: No acute territorial infarction, hemorrhage, or intracranial mass is visualized. Mild small vessel ischemic changes of the white matter. 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 Normal ventricle size Vascular: No hyperdense vessels.  Carotid artery calcification Skull: No fracture Sinuses/Orbits: Mild mucosal thickening in the maxillary and ethmoid sinuses. No acute orbital abnormality Other: Moderate right forehead hematoma and small posterior scalp swelling at the vertex CT CERVICAL SPINE FINDINGS Alignment: Straightening of the cervical spine. No subluxation. The set alignment within normal limits. Skull base and vertebrae: No acute fracture. No  primary bone lesion or focal pathologic process. Soft tissues and spinal canal: No prevertebral fluid or swelling. No visible canal hematoma. Disc levels: Mild degenerative changes at C4-C5 and C5-C6 with moderate changes at C6-C7. Upper chest: Negative. Other: Carotid artery calcification IMPRESSION: 1. No CT evidence for acute intracranial abnormality. Small vessel ischemic changes of the white matter. Moderate right forehead hematoma and small posterior scalp swelling 2. Straightening of the cervical spine with degenerative changes. No definite acute osseous abnormality. Electronically Signed   By: Donavan Foil M.D.   On: 11/24/2017 17:25   US Renal  Result Date: 11/24/2017 CLINICAL DATA:  Acute kidney injury. EXAM: RENAL / URINARY TRACT ULTRASOUND COMPLETE COMPARISON:  None. FINDINGS: Right Kidney: Length: 11.6 cm. Echogenicity within normal limits. No mass or hydronephrosis visualized. Left Kidney: Length: 12.8 cm. Mild hydronephrosis. Subcentimeter cyst in the upper pole. 2.8 cm exophytic cyst from the mid kidney. No  evidence of solid lesion. Bladder: Only minimally distended.  Neither ureteral jet was visualized. Incidental note of hepatic steatosis. IMPRESSION: 1. Mild left hydronephrosis, cause not visualized. 2. Left renal cysts. 3. Normal sonographic appearance of the right kidney. Electronically Signed   By: Jeb Levering M.D.   On: 11/24/2017 23:20   Dg Chest Portable 1 View  Result Date: 11/24/2017 CLINICAL DATA:  Multiple falls EXAM: PORTABLE CHEST 1 VIEW COMPARISON:  04/09/2014 FINDINGS: No consolidation or effusion. Stable cardiomediastinal silhouette with atherosclerosis. No pneumothorax. Rotation felt to augment the mediastinal contour. IMPRESSION: 1. No radiographic evidence for acute cardiopulmonary abnormality 2. Generous mediastinal contour is felt augmented by patient rotation Electronically Signed   By: Donavan Foil M.D.   On: 11/24/2017 21:06   Ct Renal Stone Study  Result Date: 11/25/2017 CLINICAL DATA:  Left side abdominal pain. Hydronephrosis on the left by renal ultrasound 11/24/2017. EXAM: CT ABDOMEN AND PELVIS WITHOUT CONTRAST TECHNIQUE: Multidetector CT imaging of the abdomen and pelvis was performed following the standard protocol without IV contrast. COMPARISON:  Renal ultrasound 11/24/2017. FINDINGS: Lower chest: Mild dependent atelectasis. Lung bases otherwise clear. No pleural or pericardial effusion. Hepatobiliary: The liver is diffusely low attenuating consistent with fatty infiltration. No focal lesion. The patient is status post cholecystectomy. Biliary tree is unremarkable. Pancreas: Unremarkable. No pancreatic ductal dilatation or surrounding inflammatory changes. Spleen: Normal in size without focal abnormality. Adrenals/Urinary Tract: The left kidney appears edematous with swelling about it. There is mild left hydronephrosis with stranding about the left kidney and ureter. A 0.5 cm distal left ureteral stone is identified approximately 3 cm proximal to the ureterovesical  junction. No other urinary tract stones are identified on the right or left. No urinary bladder stones. The adrenal glands appear normal. Stomach/Bowel: Surgical anastomosis in the sigmoid colon is identified. There is some scattered diverticulosis without diverticulitis. The stomach and small bowel appear normal. Vascular/Lymphatic: No significant vascular findings are present. No enlarged abdominal or pelvic lymph nodes. Reproductive: Uterus and bilateral adnexa are unremarkable. Other: The patient has a right of midline hernia containing loops of bowel without obstruction or other complicating feature. The hernia defect measures 6.8 cm transverse by by 4.3 cm craniocaudal. 2 midline ventral hernias are seen in the pelvis. The more superior is larger where a defect measuring approximately 6 cm craniocaudal by 2.3 cm transverse is present. There are loops of bowel within this morning without obstruction or other complicating feature. A smaller fat containing ventral hernia is seen more inferiorly. Musculoskeletal: No fracture or worrisome lesion. Lower lumbar spondylosis is noted IMPRESSION:  Mild left hydronephrosis with stranding about the left kidney and ureter due to a 0.5 cm distal left ureteral stone. The left kidney appears edematous compared to the right. Three abdominal wall hernias are identified. Two of these contain loops of bowel without obstruction other complicating feature. Fatty infiltration of the liver. Mild diverticulosis without diverticulitis. Atherosclerosis. Electronically Signed   By: Inge Rise M.D.   On: 11/25/2017 15:03   Dg Hip Unilat With Pelvis 2-3 Views Left  Result Date: 11/24/2017 CLINICAL DATA:  Left hip pain secondary to a fall. EXAM: DG HIP (WITH OR WITHOUT PELVIS) 2-3V LEFT COMPARISON:  None. FINDINGS: There is no evidence of hip fracture or dislocation. There is no evidence of arthropathy or other focal bone abnormality. IMPRESSION: Negative. Electronically Signed    By: Lorriane Shire M.D.   On: 11/24/2017 16:54     Scheduled Meds: . allopurinol  100 mg Oral Daily  . amLODipine  5 mg Oral Daily  . aspirin  81 mg Oral Daily  . citalopram  10 mg Oral Daily  . flecainide  150 mg Oral BID  . insulin aspart  0-5 Units Subcutaneous QHS  . insulin aspart  0-9 Units Subcutaneous TID WC  . metoprolol  200 mg Oral Daily  . pneumococcal 23 valent vaccine  0.5 mL Intramuscular Tomorrow-1000  . potassium chloride  40 mEq Oral BID WC  . warfarin  5 mg Oral ONCE-1800  . Warfarin - Pharmacist Dosing Inpatient   Does not apply q1800   Continuous Infusions: . sodium chloride 500 mL (11/26/17 0514)  . cefTRIAXone (ROCEPHIN)  IV Stopped (11/25/17 2122)     LOS: 2 days   Time Spent in minutes   30 minutes  Seleste Tallman D.O. on 11/26/2017 at 10:54 AM  Between 7am to 7pm - Pager - [phone]  After 7pm go to [email or web address] - password [email or web address]  And look for the night coverage person covering for me after hours  [facility] Office  [phone]

## 2017-11-26 NOTE — Consult Note (Signed)
Urology Consult   Physician requesting consult: Dr. Ree Kida  Reason for consult: Left ureteral stone and febrile UTI  History of Present Illness: Peggy Chen is a 66 y.o. with no history of kidney stones.  She developed left sided flank pain on Monday with dysuria and nausea and vomiting.  She was seen by Dr. Danna Hefty office and diagnosed with a UTI and treated with Bactrim.  She suffered a fall on 12/18 and was found by her co-workers on 11/24/17 presumably due to weakness.  She did become febrile in the hospital up to 101.  A renal ultrasound was performed for a Cr of 1.9.  This demonstrated left hydronephrosis and a CT scan on 12/20 confirmed an obstructing 5 mm distal left ureteral stone.  She denies a history of voiding or storage urinary symptoms, hematuria, UTIs, STDs, urolithiasis, GU malignancy/trauma/surgery.  Past Medical History:  Diagnosis Date  . AF (atrial fibrillation) ([place])   . Back pain   . Diabetes mellitus   . Hyperlipidemia   . Hypertension     Past Surgical History:  Procedure Laterality Date  . BREAST CYST REMOVAL     RIGHT BREAST  . CARDIOVERSION N/A 04/17/2014   Procedure: CARDIOVERSION;  Surgeon: Thayer Headings, MD;  Location: [facility];  Service: Cardiovascular;  Laterality: N/A;  . COLON SURGERY     May 2000  . COLONOSCOPY    . TONSILLECTOMY    . US ECHOCARDIOGRAPHY  12/30/2009   EF 55-60%     Current Hospital Medications:  Home meds:  Current Meds  Medication Sig  . allopurinol (ZYLOPRIM) 100 MG tablet Take 100 mg by mouth See admin instructions. 100 mg once a day for 1-2 weeks after gout flares  . amLODipine (NORVASC) 5 MG tablet Take 5 mg by mouth daily.  Marland Kitchen aspirin 81 MG tablet Take 81 mg by mouth daily.   . citalopram (CELEXA) 10 MG tablet Take 10 mg by mouth daily.  Marland Kitchen COLCRYS 0.6 MG tablet Take 0.6 mg by mouth daily as needed (for gout flares).   . cyanocobalamin 1000 MCG tablet Take 1,000 mcg by mouth daily.   . flecainide  (TAMBOCOR) 150 MG tablet Take 1 tablet (150 mg total) by mouth 2 (two) times daily.  Marland Kitchen HYDROcodone-acetaminophen (NORCO/VICODIN) 5-325 MG per tablet Take 1 tablet by mouth every 6 (six) hours as needed for moderate pain.  Marland Kitchen JANUMET 50-1000 MG tablet Take 1 tablet by mouth 2 (two) times daily.  . metoprolol (TOPROL-XL) 200 MG 24 hr tablet TAKE ONE TABLET BY MOUTH ONCE DAILY (Patient taking differently: Take 200 mg by mouth once a day)  . olmesartan-hydrochlorothiazide (BENICAR HCT) 20-12.5 MG tablet Take 1 tablet daily by mouth.  . sulfamethoxazole-trimethoprim (BACTRIM DS,SEPTRA DS) 800-160 MG tablet Take 1 tablet by mouth 2 (two) times daily. FOR 7 DAYS  . warfarin (COUMADIN) 5 MG tablet Take 5-7.5 mg by mouth See admin instructions. 5 mg at bedtime on Sun/Mon/Tues/Thurs/Fri/Sat and 7.5 mg on Wed  . zolpidem (AMBIEN) 10 MG tablet Take 10 mg by mouth at bedtime as needed for sleep.    Scheduled Meds: . allopurinol  100 mg Oral Daily  . amLODipine  5 mg Oral Daily  . aspirin  81 mg Oral Daily  . citalopram  10 mg Oral Daily  . flecainide  150 mg Oral BID  . insulin aspart  0-5 Units Subcutaneous QHS  . insulin aspart  0-9 Units Subcutaneous TID WC  . metoprolol  200 mg Oral  Daily  . potassium chloride  40 mEq Oral BID WC  . warfarin  5 mg Oral ONCE-1800  . Warfarin - Pharmacist Dosing Inpatient   Does not apply q1800   Continuous Infusions: . sodium chloride 500 mL (11/26/17 0514)  . cefTRIAXone (ROCEPHIN)  IV Stopped (11/25/17 2122)  . magnesium sulfate 1 - 4 g bolus IVPB     PRN Meds:.acetaminophen **OR** acetaminophen, ipratropium-albuterol, ondansetron **OR** ondansetron (ZOFRAN) IV, zolpidem  Allergies:  Allergies  Allergen Reactions  . Penicillins Hives    Has patient had a PCN reaction causing immediate rash, facial/tongue/throat swelling, SOB or lightheadedness with hypotension: Hives Has patient had a PCN reaction causing severe rash involving mucus membranes or skin  necrosis: Unk Has patient had a PCN reaction that required hospitalization: No Has patient had a PCN reaction occurring within the last 10 years: No If all of the above answers are "NO", then may proceed with Cephalosporin use.     Family History  Problem Relation Age of Onset  . COPD Mother   . Heart attack Father     Social History:  reports that she quit smoking about 31 years ago. she has never used smokeless tobacco. She reports that she drinks alcohol. She reports that she does not use drugs.  ROS: A complete review of systems was performed.  All systems are negative except for pertinent findings as noted.  Physical Exam:  Vital signs in last 24 hours: Temp:  [97.9 F (36.6 C)-100.6 F (38.1 C)] 98.2 F (36.8 C) (12/21 1046) Pulse Rate:  [59-71] 65 (12/21 1046) Resp:  [18-20] 20 (12/21 1046) BP: (112-132)/(49-62) 130/55 (12/21 1046) SpO2:  [95 %-100 %] 99 % (12/21 1046) Constitutional:  Alert and oriented, No acute distress Cardiovascular: Regular rate and rhythm, No JVD Respiratory: Normal respiratory effort, Lungs clear bilaterally GI: Abdomen is soft, nontender, nondistended, no abdominal masses GU: No CVA tenderness Lymphatic: No lymphadenopathy Neurologic: Grossly intact, no focal deficits Psychiatric: Normal mood and affect  Laboratory Data:  Recent Labs    11/24/17 1742 11/25/17 0417 11/26/17 0306  WBC 13.1* 14.0* 12.5*  HGB 12.8 13.1 10.9*  HCT 38.4 40.8 33.5*  PLT 229 232 179    Recent Labs    11/24/17 1742 11/25/17 0417 11/26/17 0306  NA 131* 134* 130*  K 3.1* 3.1* 2.9*  CL 96* 99* 101  GLUCOSE 207* 176* 196*  BUN 27* 28* 25*  CALCIUM 8.7* 8.8* 7.8*  CREATININE 1.95* 2.08* 1.59*     Results for orders placed or performed during the hospital encounter of 11/24/17 (from the past 24 hour(s))  Glucose, capillary     Status: Abnormal   Collection Time: 11/25/17  4:50 PM  Result Value Ref Range   Glucose-Capillary 197 (H) 65 - 99 mg/dL    Comment 1 Notify RN    Comment 2 Document in Chart   Glucose, capillary     Status: Abnormal   Collection Time: 11/25/17  9:36 PM  Result Value Ref Range   Glucose-Capillary 212 (H) 65 - 99 mg/dL   Comment 1 Notify RN    Comment 2 Document in Chart   Protime-INR     Status: Abnormal   Collection Time: 11/26/17  3:06 AM  Result Value Ref Range   Prothrombin Time 27.9 (H) 11.4 - 15.2 seconds   INR 2.64   CBC     Status: Abnormal   Collection Time: 11/26/17  3:06 AM  Result Value Ref Range   WBC 12.5 (  H) 4.0 - 10.5 K/uL   RBC 3.72 (L) 3.87 - 5.11 MIL/uL   Hemoglobin 10.9 (L) 12.0 - 15.0 g/dL   HCT 33.5 (L) 36.0 - 46.0 %   MCV 90.1 78.0 - 100.0 fL   MCH 29.3 26.0 - 34.0 pg   MCHC 32.5 30.0 - 36.0 g/dL   RDW 13.4 11.5 - 15.5 %   Platelets 179 150 - 400 K/uL  Basic metabolic panel     Status: Abnormal   Collection Time: 11/26/17  3:06 AM  Result Value Ref Range   Sodium 130 (L) 135 - 145 mmol/L   Potassium 2.9 (L) 3.5 - 5.1 mmol/L   Chloride 101 101 - 111 mmol/L   CO2 21 (L) 22 - 32 mmol/L   Glucose, Bld 196 (H) 65 - 99 mg/dL   BUN 25 (H) 6 - 20 mg/dL   Creatinine, Ser 1.59 (H) 0.44 - 1.00 mg/dL   Calcium 7.8 (L) 8.9 - 10.3 mg/dL   GFR calc non Af Amer 33 (L) >60 mL/min   GFR calc Af Amer 38 (L) >60 mL/min   Anion gap 8 5 - 15  Magnesium     Status: Abnormal   Collection Time: 11/26/17  3:06 AM  Result Value Ref Range   Magnesium 1.6 (L) 1.7 - 2.4 mg/dL  Glucose, capillary     Status: Abnormal   Collection Time: 11/26/17  5:59 AM  Result Value Ref Range   Glucose-Capillary 197 (H) 65 - 99 mg/dL   Comment 1 Notify RN    Comment 2 Document in Chart   Glucose, capillary     Status: Abnormal   Collection Time: 11/26/17 11:19 AM  Result Value Ref Range   Glucose-Capillary 158 (H) 65 - 99 mg/dL   Recent Results (from the past 240 hour(s))  Blood culture (routine x 2)     Status: None (Preliminary result)   Collection Time: 11/24/17  9:02 PM  Result Value Ref Range Status    Specimen Description BLOOD RIGHT ARM  Final   Special Requests   Final    BOTTLES DRAWN AEROBIC AND ANAEROBIC Blood Culture adequate volume   Culture NO GROWTH < 24 HOURS  Final   Report Status PENDING  Incomplete  Blood culture (routine x 2)     Status: None (Preliminary result)   Collection Time: 11/24/17  9:11 PM  Result Value Ref Range Status   Specimen Description BLOOD RIGHT ANTECUBITAL  Final   Special Requests   Final    BOTTLES DRAWN AEROBIC AND ANAEROBIC Blood Culture adequate volume   Culture NO GROWTH < 24 HOURS  Final   Report Status PENDING  Incomplete    Renal Function: Recent Labs    11/24/17 1742 11/25/17 0417 11/26/17 0306  CREATININE 1.95* 2.08* 1.59*   Estimated Creatinine Clearance: 40 mL/min (A) (by C-G formula based on SCr of 1.59 mg/dL (H)).  Radiologic Imaging: Ct Head Wo Contrast  Result Date: 11/24/2017 CLINICAL DATA:  Fall with hematoma to the forehead EXAM: CT HEAD WITHOUT CONTRAST CT CERVICAL SPINE WITHOUT CONTRAST TECHNIQUE: Multidetector CT imaging of the head and cervical spine was performed following the standard protocol without intravenous contrast. Multiplanar CT image reconstructions of the cervical spine were also generated. COMPARISON:  None. FINDINGS: CT HEAD FINDINGS Brain: No acute territorial infarction, hemorrhage, or intracranial mass is visualized. Mild small vessel ischemic changes of the white matter. 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 Normal ventricle size Vascular: No hyperdense vessels.  Carotid artery calcification Skull: No fracture Sinuses/Orbits: Mild mucosal thickening in the maxillary and ethmoid sinuses. No acute orbital abnormality Other: Moderate right forehead hematoma and small posterior scalp swelling at the vertex CT CERVICAL SPINE FINDINGS Alignment: Straightening of the cervical spine. No subluxation. The set alignment within normal limits. Skull base and vertebrae: No acute fracture. No primary bone lesion or  focal pathologic process. Soft tissues and spinal canal: No prevertebral fluid or swelling. No visible canal hematoma. Disc levels: Mild degenerative changes at C4-C5 and C5-C6 with moderate changes at C6-C7. Upper chest: Negative. Other: Carotid artery calcification IMPRESSION: 1. No CT evidence for acute intracranial abnormality. Small vessel ischemic changes of the white matter. Moderate right forehead hematoma and small posterior scalp swelling 2. Straightening of the cervical spine with degenerative changes. No definite acute osseous abnormality. Electronically Signed   By: Donavan Foil M.D.   On: 11/24/2017 17:25   Ct Cervical Spine Wo Contrast  Result Date: 11/24/2017 CLINICAL DATA:  Fall with hematoma to the forehead EXAM: CT HEAD WITHOUT CONTRAST CT CERVICAL SPINE WITHOUT CONTRAST TECHNIQUE: Multidetector CT imaging of the head and cervical spine was performed following the standard protocol without intravenous contrast. Multiplanar CT image reconstructions of the cervical spine were also generated. COMPARISON:  None. FINDINGS: CT HEAD FINDINGS Brain: No acute territorial infarction, hemorrhage, or intracranial mass is visualized. Mild small vessel ischemic changes of the white matter. 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 Normal ventricle size Vascular: No hyperdense vessels.  Carotid artery calcification Skull: No fracture Sinuses/Orbits: Mild mucosal thickening in the maxillary and ethmoid sinuses. No acute orbital abnormality Other: Moderate right forehead hematoma and small posterior scalp swelling at the vertex CT CERVICAL SPINE FINDINGS Alignment: Straightening of the cervical spine. No subluxation. The set alignment within normal limits. Skull base and vertebrae: No acute fracture. No primary bone lesion or focal pathologic process. Soft tissues and spinal canal: No prevertebral fluid or swelling. No visible canal hematoma. Disc levels: Mild degenerative changes at C4-C5 and C5-C6 with  moderate changes at C6-C7. Upper chest: Negative. Other: Carotid artery calcification IMPRESSION: 1. No CT evidence for acute intracranial abnormality. Small vessel ischemic changes of the white matter. Moderate right forehead hematoma and small posterior scalp swelling 2. Straightening of the cervical spine with degenerative changes. No definite acute osseous abnormality. Electronically Signed   By: Donavan Foil M.D.   On: 11/24/2017 17:25   US Renal  Result Date: 11/24/2017 CLINICAL DATA:  Acute kidney injury. EXAM: RENAL / URINARY TRACT ULTRASOUND COMPLETE COMPARISON:  None. FINDINGS: Right Kidney: Length: 11.6 cm. Echogenicity within normal limits. No mass or hydronephrosis visualized. Left Kidney: Length: 12.8 cm. Mild hydronephrosis. Subcentimeter cyst in the upper pole. 2.8 cm exophytic cyst from the mid kidney. No evidence of solid lesion. Bladder: Only minimally distended.  Neither ureteral jet was visualized. Incidental note of hepatic steatosis. IMPRESSION: 1. Mild left hydronephrosis, cause not visualized. 2. Left renal cysts. 3. Normal sonographic appearance of the right kidney. Electronically Signed   By: Jeb Levering M.D.   On: 11/24/2017 23:20   Dg Chest Portable 1 View  Result Date: 11/24/2017 CLINICAL DATA:  Multiple falls EXAM: PORTABLE CHEST 1 VIEW COMPARISON:  04/09/2014 FINDINGS: No consolidation or effusion. Stable cardiomediastinal silhouette with atherosclerosis. No pneumothorax. Rotation felt to augment the mediastinal contour. IMPRESSION: 1. No radiographic evidence for acute cardiopulmonary abnormality 2. Generous mediastinal contour is felt augmented by patient rotation Electronically Signed   By: Madie Reno.D.  On: 11/24/2017 21:06   Ct Renal Stone Study  Result Date: 11/25/2017 CLINICAL DATA:  Left side abdominal pain. Hydronephrosis on the left by renal ultrasound 11/24/2017. EXAM: CT ABDOMEN AND PELVIS WITHOUT CONTRAST TECHNIQUE: Multidetector CT imaging of  the abdomen and pelvis was performed following the standard protocol without IV contrast. COMPARISON:  Renal ultrasound 11/24/2017. FINDINGS: Lower chest: Mild dependent atelectasis. Lung bases otherwise clear. No pleural or pericardial effusion. Hepatobiliary: The liver is diffusely low attenuating consistent with fatty infiltration. No focal lesion. The patient is status post cholecystectomy. Biliary tree is unremarkable. Pancreas: Unremarkable. No pancreatic ductal dilatation or surrounding inflammatory changes. Spleen: Normal in size without focal abnormality. Adrenals/Urinary Tract: The left kidney appears edematous with swelling about it. There is mild left hydronephrosis with stranding about the left kidney and ureter. A 0.5 cm distal left ureteral stone is identified approximately 3 cm proximal to the ureterovesical junction. No other urinary tract stones are identified on the right or left. No urinary bladder stones. The adrenal glands appear normal. Stomach/Bowel: Surgical anastomosis in the sigmoid colon is identified. There is some scattered diverticulosis without diverticulitis. The stomach and small bowel appear normal. Vascular/Lymphatic: No significant vascular findings are present. No enlarged abdominal or pelvic lymph nodes. Reproductive: Uterus and bilateral adnexa are unremarkable. Other: The patient has a right of midline hernia containing loops of bowel without obstruction or other complicating feature. The hernia defect measures 6.8 cm transverse by by 4.3 cm craniocaudal. 2 midline ventral hernias are seen in the pelvis. The more superior is larger where a defect measuring approximately 6 cm craniocaudal by 2.3 cm transverse is present. There are loops of bowel within this morning without obstruction or other complicating feature. A smaller fat containing ventral hernia is seen more inferiorly. Musculoskeletal: No fracture or worrisome lesion. Lower lumbar spondylosis is noted IMPRESSION:  Mild left hydronephrosis with stranding about the left kidney and ureter due to a 0.5 cm distal left ureteral stone. The left kidney appears edematous compared to the right. Three abdominal wall hernias are identified. Two of these contain loops of bowel without obstruction other complicating feature. Fatty infiltration of the liver. Mild diverticulosis without diverticulitis. Atherosclerosis. Electronically Signed   By: Inge Rise M.D.   On: 11/25/2017 15:03   Dg Hip Unilat With Pelvis 2-3 Views Left  Result Date: 11/24/2017 CLINICAL DATA:  Left hip pain secondary to a fall. EXAM: DG HIP (WITH OR WITHOUT PELVIS) 2-3V LEFT COMPARISON:  None. FINDINGS: There is no evidence of hip fracture or dislocation. There is no evidence of arthropathy or other focal bone abnormality. IMPRESSION: Negative. Electronically Signed   By: Lorriane Shire M.D.   On: 11/24/2017 16:54    I independently reviewed the above imaging studies.  Impression/Recommendation: Left ureteral stone with febrile UTI - I discussed her situation and recommended that she undergo urgent ureteral stent placement for management.  I discussed the potential benefits and risks of the procedure, side effects of the proposed treatment, the likelihood of the patient achieving the goals of the procedure, and any potential problems that might occur during the procedure or recuperation.  She gives informed consent to proceed.  I will attempt to obtain any culture results from Dr. Danna Hefty office from earlier this week.  Continue broad spectrum IV antibiotics in the meantime pending culture results.  Bibiana Gillean,LES 11/26/2017, 1:32 PM  Pryor Curia. MD   CC: Dr. Ree Kida

## 2017-11-26 NOTE — Anesthesia Procedure Notes (Signed)
Procedure Name: Intubation Date/Time: 11/26/2017 4:22 PM Performed by: Lollie Sails, CRNA Pre-anesthesia Checklist: Patient identified, Emergency Drugs available, Suction available, Patient being monitored and Timeout performed Patient Re-evaluated:Patient Re-evaluated prior to induction Oxygen Delivery Method: Circle system utilized Preoxygenation: Pre-oxygenation with 100% oxygen Induction Type: IV induction, Rapid sequence and Cricoid Pressure applied Ventilation: Mask ventilation without difficulty Laryngoscope Size: Miller and 3 Grade View: Grade I Tube type: Oral Tube size: 7.5 mm Number of attempts: 1 Airway Equipment and Method: Stylet Placement Confirmation: ETT inserted through vocal cords under direct vision,  positive ETCO2 and breath sounds checked- equal and bilateral Secured at: 22 cm Tube secured with: Tape Dental Injury: Teeth and Oropharynx as per pre-operative assessment

## 2017-11-27 ENCOUNTER — Encounter (HOSPITAL_COMMUNITY): Payer: Self-pay | Admitting: Urology

## 2017-11-27 LAB — BASIC METABOLIC PANEL
Anion gap: 10 (ref 5–15)
BUN: 17 mg/dL (ref 6–20)
CALCIUM: 7.4 mg/dL — AB (ref 8.9–10.3)
CHLORIDE: 103 mmol/L (ref 101–111)
CO2: 19 mmol/L — AB (ref 22–32)
CREATININE: 1.17 mg/dL — AB (ref 0.44–1.00)
GFR calc Af Amer: 55 mL/min — ABNORMAL LOW (ref >60)
GFR, EST NON AFRICAN AMERICAN: 47 mL/min — AB (ref >60)
Glucose, Bld: 247 mg/dL — ABNORMAL HIGH (ref 65–99)
Potassium: 3.2 mmol/L — ABNORMAL LOW (ref 3.5–5.1)
Sodium: 132 mmol/L — ABNORMAL LOW (ref 135–145)

## 2017-11-27 LAB — CBC
HCT: 34.2 % — ABNORMAL LOW (ref 36.0–46.0)
HEMOGLOBIN: 11.1 g/dL — AB (ref 12.0–15.0)
MCH: 29.5 pg (ref 26.0–34.0)
MCHC: 32.5 g/dL (ref 30.0–36.0)
MCV: 91 fL (ref 78.0–100.0)
Platelets: 213 10*3/uL (ref 150–400)
RBC: 3.76 MIL/uL — AB (ref 3.87–5.11)
RDW: 13.5 % (ref 11.5–15.5)
WBC: 10.5 10*3/uL (ref 4.0–10.5)

## 2017-11-27 LAB — GLUCOSE, CAPILLARY
GLUCOSE-CAPILLARY: 179 mg/dL — AB (ref 65–99)
GLUCOSE-CAPILLARY: 233 mg/dL — AB (ref 65–99)
Glucose-Capillary: 296 mg/dL — ABNORMAL HIGH (ref 65–99)
Glucose-Capillary: 219 mg/dL — ABNORMAL HIGH (ref 65–99)

## 2017-11-27 LAB — PROTIME-INR
INR: 2.78
PROTHROMBIN TIME: 29.1 s — AB (ref 11.4–15.2)

## 2017-11-27 LAB — MAGNESIUM: MAGNESIUM: 1.5 mg/dL — AB (ref 1.7–2.4)

## 2017-11-27 MED ORDER — WARFARIN SODIUM 5 MG PO TABS
5.0000 mg | ORAL_TABLET | Freq: Once | ORAL | Status: AC
  Administered 2017-11-27: 5 mg via ORAL
  Filled 2017-11-27: qty 1

## 2017-11-27 NOTE — Progress Notes (Addendum)
Patient ID: Peggy Chen, female   DOB: 1951/05/10, 66 y.o.   MRN: [id number]  1 Day Post-Op Subjective: Pt doing well s/p left ureteral stent yesterday.  No fever.  Pain minimal.  Objective: Vital signs in last 24 hours: Temp:  [98 F (36.7 C)-99.2 F (37.3 C)] 98 F (36.7 C) (12/21 2100) Pulse Rate:  [56-65] 63 (12/21 2100) Resp:  [16-29] 18 (12/21 2100) BP: (107-130)/(42-74) 115/42 (12/21 2100) SpO2:  [98 %-100 %] 100 % (12/21 2100)  Intake/Output from previous day: 12/21 0701 - 12/22 0700 In: 740 [P.O.:240; I.V.:500] Out: 0  Intake/Output this shift: No intake/output data recorded.  Physical Exam:  General: Alert and oriented Abdomen: Soft, ND, no CVAT  Lab Results: Recent Labs    11/24/17 1742 11/25/17 0417 11/26/17 0306  HGB 12.8 13.1 10.9*  HCT 38.4 40.8 33.5*   BMET Recent Labs    11/25/17 0417 11/26/17 0306  NA 134* 130*  K 3.1* 2.9*  CL 99* 101  CO2 24 21*  GLUCOSE 176* 196*  BUN 28* 25*  CREATININE 2.08* 1.59*  CALCIUM 8.8* 7.8*     Studies/Results:  Urine culture results from Dr. Danna Hefty office on 12/18:  Klebsiella pneumoniae sensitive to fluoroquinolones and TMP/SMX.  Assessment/Plan: 1) Febrile UTI with left ureteral stone:  S/P left ureteral stent.  Will need 2 weeks of culture specific antibiotics.  Can use culture results from 12/18 as above and would consider discharge on ciprofloxacin for 2 weeks (pt did not tolerate TMP/SMX before due to GI side effects).  Hugo for discharge from urologic standpoint.  Will arrange outpatient follow up to ensure resolution of infection and subsequent definitive ureteroscopic stone management.    LOS: 3 days   Allexis Bordenave,LES 11/27/2017, 9:02 AM  Urine culture from 12/19 now growing GPC.  Agree with waiting for these final results prior to discharge and continue gram positive coverage in the meantime.

## 2017-11-27 NOTE — Progress Notes (Signed)
Pt BP 119/44, HR 63. MD notified, said to hold BP meds until bp stabilizes

## 2017-11-27 NOTE — Progress Notes (Signed)
ANTICOAGULATION CONSULT NOTE - Follow Up Consult  Pharmacy Consult for Coumadin Indication: atrial fibrillation  Labs: Recent Labs    11/25/17 0417 11/26/17 0306 11/27/17 1106  HGB 13.1 10.9* 11.1*  HCT 40.8 33.5* 34.2*  PLT 232 179 213  LABPROT 22.5* 27.9* 29.1*  INR 2.00 2.64 2.78  CREATININE 2.08* 1.59* 1.17*    Estimated Creatinine Clearance: 54.4 mL/min (A) (by C-G formula based on SCr of 1.17 mg/dL (H)).  Assessment: 66 yr old female continues on Coumadin for afib.  Admitted after fall- CT with no evidence of bleed. INR 2.16 on admit on home dose of 5mg  daily except 7.5mg  on Wednesdays.  The INR remains therapeutic today at 2.78, however last nights dose was never given. Since the INR was trending up this may balance out. However, if Bactrim is given to treat her UTI, then the coumadin dose needs to be decreased ~15-20%.  Goal of Therapy:  INR 2-3 Monitor platelets by anticoagulation protocol: Yes   Plan:  Warfarin 5mg  po x1 tonight Daily INR Follow for s/sx bleeding and Bactrim start   Patterson Hammersmith PharmD PGY1 Pharmacy Practice Resident 11/27/2017 3:28 PM Pager: [phone] Phone: [phone]

## 2017-11-27 NOTE — Progress Notes (Signed)
PT Cancellation Note  Patient Details Name: Peggy Chen MRN: [id number] DOB: [birth year]   Cancelled Treatment:    Reason Eval/Treat Not Completed: Fatigue/lethargy limiting ability to participate;Patient declined, no reason specified.  Attempted to see patient x2 today.  Will return tomorrow for PT visit.   [name] 11/27/2017, 5:58 PM [name]. [name], [place] Pager [phone]

## 2017-11-27 NOTE — Progress Notes (Signed)
PROGRESS NOTE    Peggy Chen  GEX:[id number] DOB: [birth year] DOA: 11/24/2017 PCP: [name], MD   Chief Complaint  Patient presents with  . Fall    Brief Narrative:  HPI On 11/24/2017 by [name] Plan [name] is a 66 y.o. female with medical history significant of PAF on Coumadin, HTN, HLD; who presented with complaints of falls and weakness.  Last 2 days she is been having nausea and vomiting in 2-3 times per day unable to keep any significant amount of food or liquids down.  Seen yesterday by her PCP diagnosed with a urinary tract infection   for which she was started on Bactrim.  Reports taking 1 dose of Bactrim.  Over the course of today she had fallen at least 3 times that she relates to weakness and loss of balance.  She hit her head against a door hinge during 1 of the falls,  but denies any loss of consciousness.  Interim history Admitted for sepsis secondary to UTI. Found to have a left ureteral stone. Urology consulted.   Assessment & Plan   Severe sepsis secondary to urinary tract infection -Presented with fever, leukocytosis- improving  -Recently diagnosed with urinary tract infection as an outpatient and placed on Bactrim, however was unable to tolerate the medication due to nausea and vomiting -Blood cultures show no growth to date -Urine culture pending -Urine culture prior to admission 11/23/17: Klebsiella pneuamoniae sensitive for fluoroquiniolones and bactrim -Continue tylenol PRN  -Continue IVF  Acute metabolic encephalopathy -appears to be have resolved -likely secondary to the above -CT head unremarkable   Acute kidney injury with left hydronephrosis -2015, last creatinine was 0.9. Currently creatinine 2.08 -Suspect secondary to medications versus dehydration from nausea and vomiting -Renal ultrasound: Mild left hydronephrosis. -CT renal stone study: Mild left hydronephrosis with stranding about the left kidney and ureter due to a 0.5  cm distal left ureteral stone -Continue IVF and monitor BMP -Urology consulted and appreciated, s/p cystoscopy and left ureteral stent placement -patient will need to follow up with Dr. Alinda Money, recommended 2 weeks of antiobiotics (TMP/SMX) -pending BMP today (creatinine has been improving, 1.59-as of 11/26/2017)  Nausea/vomiting -Resolved, continue antiemetics PRN  Hypokalemia/hypomagnesemia -Pending labs today -replace as needed and continue to monitor BMP and magnesium  Falls -Patient had multiple falls prior to admission, suspect secondary to dehydration from recent illness -PT consulted-recommended home health, rolling walker  Paroxysmal atrial fibrillation -Currently rate and rhythm controlled -Continue flecainide and Coumadin per pharmacy  Diabetes mellitus, type II -Continue insulin sliding scale and CBG monitoring -Janumet held  Essential hypertension -Continue amlodipine, metoprolol   Abdominal hernia -noted on CT renal study -patient aware  DVT Prophylaxis  Couamdin  Code Status: Full  Family Communication: None at bedside  Disposition Plan: Admitted. Pending repeat urine culture and improvement in labs. Suspect can be discharged in 24-48 hours to home.  Consultants None  Procedures  Renal US  Antibiotics   Anti-infectives (From admission, onward)   Start     Dose/Rate Route Frequency Ordered Stop   11/25/17 2000  cefTRIAXone (ROCEPHIN) 1 g in dextrose 5 % 50 mL IVPB     1 g 100 mL/hr over 30 Minutes Intravenous Every 24 hours 11/24/17 2156 12/01/17 1959   11/24/17 2015  sulfamethoxazole-trimethoprim (BACTRIM DS,SEPTRA DS) 800-160 MG per tablet 1 tablet  Status:  Discontinued     1 tablet Oral  Once 11/24/17 2008 11/24/17 2008   11/24/17 2015  cefTRIAXone (ROCEPHIN) 1  g in dextrose 5 % 50 mL IVPB     1 g 100 mL/hr over 30 Minutes Intravenous  Once 11/24/17 2009 11/24/17 2056      Subjective:   Peggy Chen seen and examined today.  Feeling  better today, but still tired. Denies further nausea, vomiting. Denies abdominal pain, chest pain, shortness of breath, dizziness, headache.  Objective:   Vitals:   11/26/17 1745 11/26/17 1838 11/26/17 2100 11/27/17 1010  BP: 107/62 (!) 120/46 (!) 115/42 (!) 117/50  Pulse: (!) 56 (!) 57 63 61  Resp: 17 18 18 18   Temp:  98.4 F (36.9 C) 98 F (36.7 C) 98.9 F (37.2 C)  TempSrc:  Oral Oral Oral  SpO2: 100% 100% 100% 100%  Weight:      Height:        Intake/Output Summary (Last 24 hours) at 11/27/2017 1040 Last data filed at 11/26/2017 2209 Gross per 24 hour  Intake 740 ml  Output 0 ml  Net 740 ml   Filed Weights   11/24/17 1541 11/25/17 0054  Weight: 95.3 kg (210 lb) 99 kg (218 lb 4.1 oz)   Exam  General: Well developed, well nourished, NAD, appears stated age  HEENT: NCAT, periorbital bruising, mucous membranes moist.   Cardiovascular: S1 S2 auscultated, RRR, no murmrus  Respiratory: Clear to auscultation bilaterally with equal chest rise  Abdomen: Soft, obese, nontender, nondistended, + bowel sounds  Extremities: warm dry without cyanosis clubbing or edema  Neuro: AAOx3, nonfocal  Psych: Pleasant and appropriate.  Data Reviewed: I have personally reviewed following labs and imaging studies  CBC: Recent Labs  Lab 11/24/17 1742 11/25/17 0417 11/26/17 0306  WBC 13.1* 14.0* 12.5*  NEUTROABS 11.7*  --   --   HGB 12.8 13.1 10.9*  HCT 38.4 40.8 33.5*  MCV 90.8 91.7 90.1  PLT 229 232 941   Basic Metabolic Panel: Recent Labs  Lab 11/24/17 1742 11/25/17 0417 11/26/17 0306  NA 131* 134* 130*  K 3.1* 3.1* 2.9*  CL 96* 99* 101  CO2 25 24 21*  GLUCOSE 207* 176* 196*  BUN 27* 28* 25*  CREATININE 1.95* 2.08* 1.59*  CALCIUM 8.7* 8.8* 7.8*  MG  --   --  1.6*   GFR: Estimated Creatinine Clearance: 40 mL/min (A) (by C-G formula based on SCr of 1.59 mg/dL (H)). Liver Function Tests: Recent Labs  Lab 11/24/17 1742  AST 44*  ALT 23  ALKPHOS 56  BILITOT  1.2  PROT 6.3*  ALBUMIN 3.2*   No results for input(s): LIPASE, AMYLASE in the last 168 hours. No results for input(s): AMMONIA in the last 168 hours. Coagulation Profile: Recent Labs  Lab 11/24/17 1742 11/25/17 0417 11/26/17 0306  INR 2.16 2.00 2.64   Cardiac Enzymes: No results for input(s): CKTOTAL, CKMB, CKMBINDEX, TROPONINI in the last 168 hours. BNP (last 3 results) No results for input(s): PROBNP in the last 8760 hours. HbA1C: No results for input(s): HGBA1C in the last 72 hours. CBG: Recent Labs  Lab 11/26/17 1526 11/26/17 1726 11/26/17 2115 11/26/17 2339 11/27/17 0618  GLUCAP 132* 127* 208* 262* 179*   Lipid Profile: No results for input(s): CHOL, HDL, LDLCALC, TRIG, CHOLHDL, LDLDIRECT in the last 72 hours. Thyroid Function Tests: No results for input(s): TSH, T4TOTAL, FREET4, T3FREE, THYROIDAB in the last 72 hours. Anemia Panel: No results for input(s): VITAMINB12, FOLATE, FERRITIN, TIBC, IRON, RETICCTPCT in the last 72 hours. Urine analysis:    Component Value Date/Time   COLORURINE YELLOW 11/24/2017  1906   APPEARANCEUR HAZY (A) 11/24/2017 1906   LABSPEC 1.018 11/24/2017 1906   PHURINE 5.0 11/24/2017 1906   GLUCOSEU NEGATIVE 11/24/2017 1906   HGBUR LARGE (A) 11/24/2017 [place] NEGATIVE 11/24/2017 1906   KETONESUR 5 (A) 11/24/2017 1906   PROTEINUR 100 (A) 11/24/2017 1906   NITRITE NEGATIVE 11/24/2017 1906   LEUKOCYTESUR NEGATIVE 11/24/2017 1906    Recent Results (from the past 240 hour(s))  Urine culture     Status: None (Preliminary result)   Collection Time: 11/24/17  8:10 PM  Result Value Ref Range Status   Specimen Description URINE, RANDOM  Final   Special Requests NONE  Final   Culture CULTURE REINCUBATED FOR BETTER GROWTH  Final   Report Status PENDING  Incomplete  Blood culture (routine x 2)     Status: None (Preliminary result)   Collection Time: 11/24/17  9:02 PM  Result Value Ref Range Status   Specimen Description BLOOD  RIGHT ARM  Final   Special Requests   Final    BOTTLES DRAWN AEROBIC AND ANAEROBIC Blood Culture adequate volume   Culture NO GROWTH 2 DAYS  Final   Report Status PENDING  Incomplete  Blood culture (routine x 2)     Status: None (Preliminary result)   Collection Time: 11/24/17  9:11 PM  Result Value Ref Range Status   Specimen Description BLOOD RIGHT ANTECUBITAL  Final   Special Requests   Final    BOTTLES DRAWN AEROBIC AND ANAEROBIC Blood Culture adequate volume   Culture NO GROWTH 2 DAYS  Final   Report Status PENDING  Incomplete      Radiology Studies: Dg C-arm 1-60 Min-no Report  Result Date: 11/26/2017 Fluoroscopy was utilized by the requesting physician.  No radiographic interpretation.   Ct Renal Stone Study  Result Date: 11/25/2017 CLINICAL DATA:  Left side abdominal pain. Hydronephrosis on the left by renal ultrasound 11/24/2017. EXAM: CT ABDOMEN AND PELVIS WITHOUT CONTRAST TECHNIQUE: Multidetector CT imaging of the abdomen and pelvis was performed following the standard protocol without IV contrast. COMPARISON:  Renal ultrasound 11/24/2017. FINDINGS: Lower chest: Mild dependent atelectasis. Lung bases otherwise clear. No pleural or pericardial effusion. Hepatobiliary: The liver is diffusely low attenuating consistent with fatty infiltration. No focal lesion. The patient is status post cholecystectomy. Biliary tree is unremarkable. Pancreas: Unremarkable. No pancreatic ductal dilatation or surrounding inflammatory changes. Spleen: Normal in size without focal abnormality. Adrenals/Urinary Tract: The left kidney appears edematous with swelling about it. There is mild left hydronephrosis with stranding about the left kidney and ureter. A 0.5 cm distal left ureteral stone is identified approximately 3 cm proximal to the ureterovesical junction. No other urinary tract stones are identified on the right or left. No urinary bladder stones. The adrenal glands appear normal. Stomach/Bowel:  Surgical anastomosis in the sigmoid colon is identified. There is some scattered diverticulosis without diverticulitis. The stomach and small bowel appear normal. Vascular/Lymphatic: No significant vascular findings are present. No enlarged abdominal or pelvic lymph nodes. Reproductive: Uterus and bilateral adnexa are unremarkable. Other: The patient has a right of midline hernia containing loops of bowel without obstruction or other complicating feature. The hernia defect measures 6.8 cm transverse by by 4.3 cm craniocaudal. 2 midline ventral hernias are seen in the pelvis. The more superior is larger where a defect measuring approximately 6 cm craniocaudal by 2.3 cm transverse is present. There are loops of bowel within this morning without obstruction or other complicating feature. A smaller fat containing ventral  hernia is seen more inferiorly. Musculoskeletal: No fracture or worrisome lesion. Lower lumbar spondylosis is noted IMPRESSION: Mild left hydronephrosis with stranding about the left kidney and ureter due to a 0.5 cm distal left ureteral stone. The left kidney appears edematous compared to the right. Three abdominal wall hernias are identified. Two of these contain loops of bowel without obstruction other complicating feature. Fatty infiltration of the liver. Mild diverticulosis without diverticulitis. Atherosclerosis. Electronically Signed   By: Inge Rise M.D.   On: 11/25/2017 15:03     Scheduled Meds: . allopurinol  100 mg Oral Daily  . amLODipine  5 mg Oral Daily  . aspirin  81 mg Oral Daily  . citalopram  10 mg Oral Daily  . flecainide  150 mg Oral BID  . insulin aspart  0-5 Units Subcutaneous QHS  . insulin aspart  0-9 Units Subcutaneous TID WC  . metoprolol  200 mg Oral Daily  . warfarin  5 mg Oral ONCE-1800  . Warfarin - Pharmacist Dosing Inpatient   Does not apply q1800   Continuous Infusions: . sodium chloride 100 mL/hr at 11/26/17 2031  . cefTRIAXone (ROCEPHIN)  IV  Stopped (11/26/17 2105)  . magnesium sulfate 1 - 4 g bolus IVPB       LOS: 3 days   Time Spent in minutes   30 minutes  Maleiah Dula D.O. on 11/27/2017 at 10:40 AM  Between 7am to 7pm - Pager - [phone]  After 7pm go to [email or web address] - password [email or web address]  And look for the night coverage person covering for me after hours  [facility] Office  [phone]

## 2017-11-28 DIAGNOSIS — N200 Calculus of kidney: Secondary | ICD-10-CM

## 2017-11-28 DIAGNOSIS — N132 Hydronephrosis with renal and ureteral calculous obstruction: Secondary | ICD-10-CM

## 2017-11-28 LAB — CBC
HCT: 31.5 % — ABNORMAL LOW (ref 36.0–46.0)
Hemoglobin: 10.4 g/dL — ABNORMAL LOW (ref 12.0–15.0)
MCH: 29.7 pg (ref 26.0–34.0)
MCHC: 33 g/dL (ref 30.0–36.0)
MCV: 90 fL (ref 78.0–100.0)
PLATELETS: 226 10*3/uL (ref 150–400)
RBC: 3.5 MIL/uL — ABNORMAL LOW (ref 3.87–5.11)
RDW: 14 % (ref 11.5–15.5)
WBC: 10.2 10*3/uL (ref 4.0–10.5)

## 2017-11-28 LAB — BASIC METABOLIC PANEL
ANION GAP: 8 (ref 5–15)
BUN: 13 mg/dL (ref 6–20)
CALCIUM: 7.6 mg/dL — AB (ref 8.9–10.3)
CO2: 19 mmol/L — ABNORMAL LOW (ref 22–32)
Chloride: 109 mmol/L (ref 101–111)
Creatinine, Ser: 1.17 mg/dL — ABNORMAL HIGH (ref 0.44–1.00)
GFR calc Af Amer: 55 mL/min — ABNORMAL LOW (ref >60)
GFR, EST NON AFRICAN AMERICAN: 47 mL/min — AB (ref >60)
GLUCOSE: 173 mg/dL — AB (ref 65–99)
Potassium: 3 mmol/L — ABNORMAL LOW (ref 3.5–5.1)
Sodium: 136 mmol/L (ref 135–145)

## 2017-11-28 LAB — GLUCOSE, CAPILLARY
GLUCOSE-CAPILLARY: 170 mg/dL — AB (ref 65–99)
GLUCOSE-CAPILLARY: 178 mg/dL — AB (ref 65–99)
Glucose-Capillary: 175 mg/dL — ABNORMAL HIGH (ref 65–99)
Glucose-Capillary: 208 mg/dL — ABNORMAL HIGH (ref 65–99)

## 2017-11-28 LAB — PROTIME-INR
INR: 2.76
Prothrombin Time: 29 seconds — ABNORMAL HIGH (ref 11.4–15.2)

## 2017-11-28 LAB — MAGNESIUM: Magnesium: 1.4 mg/dL — ABNORMAL LOW (ref 1.7–2.4)

## 2017-11-28 MED ORDER — POTASSIUM CHLORIDE CRYS ER 20 MEQ PO TBCR
40.0000 meq | EXTENDED_RELEASE_TABLET | ORAL | Status: AC
  Administered 2017-11-28 (×2): 40 meq via ORAL
  Filled 2017-11-28 (×2): qty 2

## 2017-11-28 MED ORDER — MAGNESIUM OXIDE 400 MG PO CAPS: 400.0000 mg | ORAL_CAPSULE | Freq: Every day | ORAL | 0 refills | Status: DC

## 2017-11-28 MED ORDER — MAGNESIUM SULFATE 4 GM/100ML IV SOLN
4.0000 g | Freq: Once | INTRAVENOUS | Status: AC
  Administered 2017-11-28: 4 g via INTRAVENOUS
  Filled 2017-11-28: qty 100

## 2017-11-28 MED ORDER — WARFARIN SODIUM 5 MG PO TABS
5.0000 mg | ORAL_TABLET | Freq: Once | ORAL | Status: AC
  Administered 2017-11-28: 5 mg via ORAL
  Filled 2017-11-28: qty 1

## 2017-11-28 MED ORDER — SITAGLIPTIN PHOSPHATE 50 MG PO TABS: 50.0000 mg | ORAL_TABLET | Freq: Every day | ORAL | 0 refills | Status: DC

## 2017-11-28 MED ORDER — POTASSIUM CHLORIDE ER 10 MEQ PO TBCR: 20.0000 meq | EXTENDED_RELEASE_TABLET | Freq: Every day | ORAL | 0 refills | Status: DC

## 2017-11-28 MED ORDER — LEVOFLOXACIN 500 MG PO TABS: 500.0000 mg | ORAL_TABLET | Freq: Every day | ORAL | Status: DC

## 2017-11-28 MED ORDER — LEVOFLOXACIN 500 MG PO TABS: 500.0000 mg | ORAL_TABLET | Freq: Every day | ORAL | 0 refills | Status: DC

## 2017-11-28 NOTE — Progress Notes (Signed)
PT Cancellation Note  Patient Details Name: Peggy Chen MRN: [id number] DOB: November 23, 1951   Cancelled Treatment:    Reason Eval/Treat Not Completed: Patient declined, no reason specified.  Patient declining PT, reporting she is being discharged.   [name] 11/28/2017, 4:28 PM [name]. [name], [place] Pager [phone]

## 2017-11-28 NOTE — Progress Notes (Signed)
Pt non- complaint with safety measures such as bed alarm and chair alarm. RN has educated pt on need and reason for staff presence during ambulation and transfers. Pt verbalizes understanding. Patient has been educated on potential risks of not having a staff member in the room during ambulation and verbalizes understanding. Able to teach back. Patient visitors also educated and non- compliant.

## 2017-11-28 NOTE — Progress Notes (Addendum)
Pt eager to leave. Cardiac monitor and IV discontinued. Pt given discharge instruction and follow up information. Pt verbalize understanding and able to teach back.  Pt taken down by tech in wheelchair. Understands discharge instructions. No further questions, no new concerns.

## 2017-11-28 NOTE — Progress Notes (Addendum)
ANTICOAGULATION and Antibiotic CONSULT NOTE  Pharmacy Consult for Coumadin and Levaquin Indication: atrial fibrillation/UTI  Labs: Recent Labs    11/26/17 0306 11/27/17 1106 11/28/17 0536  HGB 10.9* 11.1* 10.4*  HCT 33.5* 34.2* 31.5*  PLT 179 213 226  LABPROT 27.9* 29.1* 29.0*  INR 2.64 2.78 2.76  CREATININE 1.59* 1.17* 1.17*    Estimated Creatinine Clearance: 54.4 mL/min (A) (by C-G formula based on SCr of 1.17 mg/dL (H)).  Assessment: 66 yr old female continues on Coumadin for afib.  Admitted after fall- CT with no evidence of bleed. INR 2.16 on admit on home dose of 5mg  daily except 7.5mg  on Wednesdays. The INR remains therapeutic today at 2.76, will continue home dosing. Coumadin dose on 12/21 was never given, so INR may trend down some in the next few days. Monitor INR with addition of levaquin as there is a slight drug interaction that may increase INR.  Peggy Chen was being managed concurrently for pyelonephritis. A stent was placed by urology due to her ureteral stones and she was being treated with ceftriaxone empirically. Urology notes that a urine culture from Dr. Danna Hefty office grew kleb pneumo that is sensitive to both flouroquinolones and bactrim. The ceftriaxone was transitioned to levaquin.  Goal of Therapy:  INR 2-3 Monitor platelets by anticoagulation protocol: Yes   Plan:  Warfarin 5mg  po x1 tonight Start Levaquin 500mg  daily Stop Ceftriaxone Daily INR, monitor renal function Determine length of therapy for [place] PharmD PGY1 Pharmacy Practice Resident 11/28/2017 8:47 AM Pager: [phone] Phone: [phone]

## 2017-11-28 NOTE — Progress Notes (Signed)
Patient ID: ALIYHA FORNES, female   DOB: 13-Sep-1951, 66 y.o.   MRN: [id number]  2 Days Post-Op Subjective: Pt feeling well today.  No complaints.  Objective: Vital signs in last 24 hours: Temp:  [98.4 F (36.9 C)-99.2 F (37.3 C)] 98.7 F (37.1 C) (12/23 1437) Pulse Rate:  [57-69] 68 (12/23 1437) Resp:  [18-20] 20 (12/23 1437) BP: (127-138)/(48-66) 128/60 (12/23 1437) SpO2:  [96 %-100 %] 99 % (12/23 1437)  Intake/Output from previous day: 12/22 0701 - 12/23 0700 In: 1240 [P.O.:240; I.V.:1000] Out: -  Intake/Output this shift: Total I/O In: -  Out: 150 [Urine:150]  Physical Exam:  General: Alert and oriented Abd: No CVAT  Lab Results: Recent Labs    11/26/17 0306 11/27/17 1106 11/28/17 0536  HGB 10.9* 11.1* 10.4*  HCT 33.5* 34.2* 31.5*   BMET Recent Labs    11/27/17 1106 11/28/17 0536  NA 132* 136  K 3.2* 3.0*  CL 103 109  CO2 19* 19*  GLUCOSE 247* 173*  BUN 17 13  CREATININE 1.17* 1.17*  CALCIUM 7.4* 7.6*     Studies/Results: Urine culture (12/19): [facility] Urine culture (12/17): Pansensitive Klebsiella  Assessment/Plan: Left ureteral stone and febrile UTI: S/P left stent.  Improved.  Will need two weeks of culture specific antibiotics.  Culture from hospital appears to be growing out a different bacteria than outpatient culture.  Awaiting final sensitivities.   LOS: 4 days   Raeann Offner,LES 11/28/2017, 5:27 PM

## 2017-11-28 NOTE — Progress Notes (Deleted)
Physician Discharge Summary  Peggy Chen WCB:[id number] DOB: 1951-03-30 DOA: 11/24/2017  PCP: [name], MD  Admit date: 11/24/2017 Discharge date: 11/28/2017  Admitted From: home Disposition:  home   Recommendations for Outpatient Follow-up:  1. F/u Bmet, Mg+ EKG in 3-4 days 2. F/u BP and resume medications if Cr improved 3. Resume Metformin if renal function continues to improve  Discharge Condition:  stable   CODE STATUS:  Full code   Consultations:  Urology     Discharge Diagnoses:  Principal Problem:   Sepsis secondary to UTI [facility]) Active Problems:   Paroxysmal atrial fibrillation (HCC)   HTN (hypertension)   Hypokalemia   Falls   Diabetes mellitus type 2, uncomplicated (HCC)   Chronic anticoagulation      HPI:  HPI On 11/24/2017 by Dr. Leana Roe Peggy Chen a 66 y.o.femalewith medical history significant ofPAF on Coumadin, HTN, HLD;who presented with complaints of falls and weakness.Last 2 days she is been having nausea and vomiting in 2-3 times per day unable to keep any significant amount of food or liquids down. Seen yesterday by her PCP diagnosed with a urinary tract infection for which she was started on Bactrim. Reports taking 1 dose of Bactrim. On the day of admission, she fell 3 times that she relates to weakness and loss of balance.Shehit her head against a door hinge during 1 of the falls,but denies any loss of consciousness. She was admitted for sepsis due to a UTI, AKI, metabolic encephalopathy, nausea and vomiting.   Subjective: Has some pain in her "tailbone" due her fall prior to admission. No nausea, vomiting, dysuria or back pain. She did have left sided flank pain on admission. No trouble with ambulation.   ROS: no complaints constipation diarrhea, cough, dyspnea or dysuria. No other complaints.   Assessment & Plan:   Principal Problem:   Sepsis secondary to UTI - with left flank pain - Found to have a left  ureteral stone with left sided hydronephrosis. Urology was consulted and she underwent a cystoscopy and left ureteral stent placement on 12/21. - unfortunately, U cultures have only grown out 20,000 gr + cocci and we are unable to obtain further sensitivities on this- the patient had taken Bactrim prior to arrival and this would explain the poor culture result - based on current culture, her allergy to PCN (hives), her inability to take Bactrim due to AKI (not back to baseline) and nausea/ vomiting as a result of Bactrim, we only have one option to treat this UTI and this is flouroquinolones- I have discussed this with ID as well-  unfortunately, she is also on Flecanide and has low K and Mg+ which I am replacing - she will need a EKG and Bmet and Mg levels in the next few days- I have explained to her that it is important that she f/u with her PCP - urology would like her to stay on 2 wks total of antibiotics - Dr Alinda Money to arrange outpt f/u   Active Problems:  AKI - due to nausea, vomiting, sepsis, hydronephrosis and UTI - baseline cr about 0.9- admitted with Cr 2.08- Cr has improved to 1.17 with above treatment and fluids - see renal imaging studies below  Nausea/ vomiting - ? Due to Bactrim vs UTI and stone- resolved  Hypokalemia and Hypomagnesemia - replacing- will d/c home with Mg and K replacement    Paroxysmal atrial fibrillation  - cont Flecainide, Metoprolol and Coumadin- INR therapeutic  HTN (hypertension) - hold Olmasartan/HCTZ until renal function normalizes    Diabetes mellitus type 2, uncomplicated  - cont Januvia- hold Metformin until renal function determined to be stable as outpt     Discharge Instructions  Discharge Instructions    Diet - low sodium heart healthy   Complete by:  As directed    Diet Carb Modified   Complete by:  As directed    Increase activity slowly   Complete by:  As directed      Allergies as of 11/28/2017      Reactions    Penicillins Hives   Has patient had a PCN reaction causing immediate rash, facial/tongue/throat swelling, SOB or lightheadedness with hypotension: Hives Has patient had a PCN reaction causing severe rash involving mucus membranes or skin necrosis: Unk Has patient had a PCN reaction that required hospitalization: No Has patient had a PCN reaction occurring within the last 10 years: No If all of the above answers are "NO", then may proceed with Cephalosporin use.      Medication List    STOP taking these medications   JANUMET 50-1000 MG tablet Generic drug:  sitaGLIPtin-metformin   olmesartan-hydrochlorothiazide 20-12.5 MG tablet Commonly known as:  BENICAR HCT   sulfamethoxazole-trimethoprim 800-160 MG tablet Commonly known as:  BACTRIM DS,SEPTRA DS     TAKE these medications   allopurinol 100 MG tablet Commonly known as:  ZYLOPRIM Take 100 mg by mouth See admin instructions. 100 mg once a day for 1-2 weeks after gout flares   amLODipine 5 MG tablet Commonly known as:  NORVASC Take 5 mg by mouth daily.   aspirin 81 MG tablet Take 81 mg by mouth daily.   citalopram 10 MG tablet Commonly known as:  CELEXA Take 10 mg by mouth daily.   COLCRYS 0.6 MG tablet Generic drug:  colchicine Take 0.6 mg by mouth daily as needed (for gout flares).   cyanocobalamin 1000 MCG tablet Take 1,000 mcg by mouth daily.   flecainide 150 MG tablet Commonly known as:  TAMBOCOR Take 1 tablet (150 mg total) by mouth 2 (two) times daily.   HYDROcodone-acetaminophen 5-325 MG tablet Commonly known as:  NORCO/VICODIN Take 1 tablet by mouth every 6 (six) hours as needed for moderate pain.   levofloxacin 500 MG tablet Commonly known as:  LEVAQUIN Take 1 tablet (500 mg total) by mouth daily.   Magnesium Oxide 400 MG Caps Take 1 capsule (400 mg total) by mouth daily.   metoprolol 200 MG 24 hr tablet Commonly known as:  TOPROL-XL TAKE ONE TABLET BY MOUTH ONCE DAILY What changed:    how much  to take  how to take this  when to take this   potassium chloride 10 MEQ tablet Commonly known as:  K-DUR Take 2 tablets (20 mEq total) by mouth daily.   sitaGLIPtin 50 MG tablet Commonly known as:  JANUVIA Take 1 tablet (50 mg total) by mouth daily.   warfarin 5 MG tablet Commonly known as:  COUMADIN Take 5-7.5 mg by mouth See admin instructions. 5 mg at bedtime on Sun/Mon/Tues/Thurs/Fri/Sat and 7.5 mg on Wed   zolpidem 10 MG tablet Commonly known as:  AMBIEN Take 10 mg by mouth at bedtime as needed for sleep.            Durable Medical Equipment  (From admission, onward)        Start     Ordered   11/26/17 1157  For home use only DME Walker rolling  Once    Question:  Patient needs a walker to treat with the following condition  Answer:  Mobility impaired   11/26/17 1157     Follow-up Information    Raynelle Bring, MD Follow up.   Specialty:  Urology Why:  Will call to schedule outpatinet follow up visit and surgery. Contact information: [place] [address] [phone]        [name], MD Follow up.   Specialty:  Internal Medicine Why:  please do the following blood work in 3-5 days: Bmet, Magnesium She will also need an EKG to f/u on QT interval due to being both on Flecanide and Levaquin. Contact information: [address]  [place] [phone]          Allergies  Allergen Reactions  . Penicillins Hives    Has patient had a PCN reaction causing immediate rash, facial/tongue/throat swelling, SOB or lightheadedness with hypotension: Hives Has patient had a PCN reaction causing severe rash involving mucus membranes or skin necrosis: Unk Has patient had a PCN reaction that required hospitalization: No Has patient had a PCN reaction occurring within the last 10 years: No If all of the above answers are "NO", then may proceed with Cephalosporin use.      Procedures/Studies:  Ureteral stent/  cystoscopy  Ct Head Wo Contrast  Result Date: 11/24/2017 CLINICAL DATA:  Fall with hematoma to the forehead EXAM: CT HEAD WITHOUT CONTRAST CT CERVICAL SPINE WITHOUT CONTRAST TECHNIQUE: Multidetector CT imaging of the head and cervical spine was performed following the standard protocol without intravenous contrast. Multiplanar CT image reconstructions of the cervical spine were also generated. COMPARISON:  None. FINDINGS: CT HEAD FINDINGS Brain: No acute territorial infarction, hemorrhage, or intracranial mass is visualized. Mild small vessel ischemic changes of the white matter. 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 Normal ventricle size Vascular: No hyperdense vessels.  Carotid artery calcification Skull: No fracture Sinuses/Orbits: Mild mucosal thickening in the maxillary and ethmoid sinuses. No acute orbital abnormality Other: Moderate right forehead hematoma and small posterior scalp swelling at the vertex CT CERVICAL SPINE FINDINGS Alignment: Straightening of the cervical spine. No subluxation. The set alignment within normal limits. Skull base and vertebrae: No acute fracture. No primary bone lesion or focal pathologic process. Soft tissues and spinal canal: No prevertebral fluid or swelling. No visible canal hematoma. Disc levels: Mild degenerative changes at C4-C5 and C5-C6 with moderate changes at C6-C7. Upper chest: Negative. Other: Carotid artery calcification IMPRESSION: 1. No CT evidence for acute intracranial abnormality. Small vessel ischemic changes of the white matter. Moderate right forehead hematoma and small posterior scalp swelling 2. Straightening of the cervical spine with degenerative changes. No definite acute osseous abnormality. Electronically Signed   By: Donavan Foil M.D.   On: 11/24/2017 17:25   Ct Cervical Spine Wo Contrast  Result Date: 11/24/2017 CLINICAL DATA:  Fall with hematoma to the forehead EXAM: CT HEAD WITHOUT CONTRAST CT CERVICAL SPINE WITHOUT CONTRAST  TECHNIQUE: Multidetector CT imaging of the head and cervical spine was performed following the standard protocol without intravenous contrast. Multiplanar CT image reconstructions of the cervical spine were also generated. COMPARISON:  None. FINDINGS: CT HEAD FINDINGS Brain: No acute territorial infarction, hemorrhage, or intracranial mass is visualized. Mild small vessel ischemic changes of the white matter. 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 Normal ventricle size Vascular: No hyperdense vessels.  Carotid artery calcification Skull: No fracture Sinuses/Orbits: Mild mucosal thickening in the maxillary and ethmoid sinuses.  No acute orbital abnormality Other: Moderate right forehead hematoma and small posterior scalp swelling at the vertex CT CERVICAL SPINE FINDINGS Alignment: Straightening of the cervical spine. No subluxation. The set alignment within normal limits. Skull base and vertebrae: No acute fracture. No primary bone lesion or focal pathologic process. Soft tissues and spinal canal: No prevertebral fluid or swelling. No visible canal hematoma. Disc levels: Mild degenerative changes at C4-C5 and C5-C6 with moderate changes at C6-C7. Upper chest: Negative. Other: Carotid artery calcification IMPRESSION: 1. No CT evidence for acute intracranial abnormality. Small vessel ischemic changes of the white matter. Moderate right forehead hematoma and small posterior scalp swelling 2. Straightening of the cervical spine with degenerative changes. No definite acute osseous abnormality. Electronically Signed   By: Donavan Foil M.D.   On: 11/24/2017 17:25   US Renal  Result Date: 11/24/2017 CLINICAL DATA:  Acute kidney injury. EXAM: RENAL / URINARY TRACT ULTRASOUND COMPLETE COMPARISON:  None. FINDINGS: Right Kidney: Length: 11.6 cm. Echogenicity within normal limits. No mass or hydronephrosis visualized. Left Kidney: Length: 12.8 cm. Mild hydronephrosis. Subcentimeter cyst in the upper pole. 2.8 cm exophytic  cyst from the mid kidney. No evidence of solid lesion. Bladder: Only minimally distended.  Neither ureteral jet was visualized. Incidental note of hepatic steatosis. IMPRESSION: 1. Mild left hydronephrosis, cause not visualized. 2. Left renal cysts. 3. Normal sonographic appearance of the right kidney. Electronically Signed   By: Jeb Levering M.D.   On: 11/24/2017 23:20   Dg Chest Portable 1 View  Result Date: 11/24/2017 CLINICAL DATA:  Multiple falls EXAM: PORTABLE CHEST 1 VIEW COMPARISON:  04/09/2014 FINDINGS: No consolidation or effusion. Stable cardiomediastinal silhouette with atherosclerosis. No pneumothorax. Rotation felt to augment the mediastinal contour. IMPRESSION: 1. No radiographic evidence for acute cardiopulmonary abnormality 2. Generous mediastinal contour is felt augmented by patient rotation Electronically Signed   By: Donavan Foil M.D.   On: 11/24/2017 21:06   Dg C-arm 1-60 Min-no Report  Result Date: 11/26/2017 Fluoroscopy was utilized by the requesting physician.  No radiographic interpretation.   Ct Renal Stone Study  Result Date: 11/25/2017 CLINICAL DATA:  Left side abdominal pain. Hydronephrosis on the left by renal ultrasound 11/24/2017. EXAM: CT ABDOMEN AND PELVIS WITHOUT CONTRAST TECHNIQUE: Multidetector CT imaging of the abdomen and pelvis was performed following the standard protocol without IV contrast. COMPARISON:  Renal ultrasound 11/24/2017. FINDINGS: Lower chest: Mild dependent atelectasis. Lung bases otherwise clear. No pleural or pericardial effusion. Hepatobiliary: The liver is diffusely low attenuating consistent with fatty infiltration. No focal lesion. The patient is status post cholecystectomy. Biliary tree is unremarkable. Pancreas: Unremarkable. No pancreatic ductal dilatation or surrounding inflammatory changes. Spleen: Normal in size without focal abnormality. Adrenals/Urinary Tract: The left kidney appears edematous with swelling about it. There is  mild left hydronephrosis with stranding about the left kidney and ureter. A 0.5 cm distal left ureteral stone is identified approximately 3 cm proximal to the ureterovesical junction. No other urinary tract stones are identified on the right or left. No urinary bladder stones. The adrenal glands appear normal. Stomach/Bowel: Surgical anastomosis in the sigmoid colon is identified. There is some scattered diverticulosis without diverticulitis. The stomach and small bowel appear normal. Vascular/Lymphatic: No significant vascular findings are present. No enlarged abdominal or pelvic lymph nodes. Reproductive: Uterus and bilateral adnexa are unremarkable. Other: The patient has a right of midline hernia containing loops of bowel without obstruction or other complicating feature. The hernia defect measures 6.8 cm transverse by by 4.3 cm  craniocaudal. 2 midline ventral hernias are seen in the pelvis. The more superior is larger where a defect measuring approximately 6 cm craniocaudal by 2.3 cm transverse is present. There are loops of bowel within this morning without obstruction or other complicating feature. A smaller fat containing ventral hernia is seen more inferiorly. Musculoskeletal: No fracture or worrisome lesion. Lower lumbar spondylosis is noted IMPRESSION: Mild left hydronephrosis with stranding about the left kidney and ureter due to a 0.5 cm distal left ureteral stone. The left kidney appears edematous compared to the right. Three abdominal wall hernias are identified. Two of these contain loops of bowel without obstruction other complicating feature. Fatty infiltration of the liver. Mild diverticulosis without diverticulitis. Atherosclerosis. Electronically Signed   By: Inge Rise M.D.   On: 11/25/2017 15:03   Dg Hip Unilat With Pelvis 2-3 Views Left  Result Date: 11/24/2017 CLINICAL DATA:  Left hip pain secondary to a fall. EXAM: DG HIP (WITH OR WITHOUT PELVIS) 2-3V LEFT COMPARISON:  None.  FINDINGS: There is no evidence of hip fracture or dislocation. There is no evidence of arthropathy or other focal bone abnormality. IMPRESSION: Negative. Electronically Signed   By: Lorriane Shire M.D.   On: 11/24/2017 16:54       Discharge Exam: Vitals:   11/28/17 0918 11/28/17 1437  BP: (!) 133/55 128/60  Pulse: 60 68  Resp: 20 20  Temp: 98.6 F (37 C) 98.7 F (37.1 C)  SpO2: 99% 99%   Vitals:   11/28/17 0101 11/28/17 0441 11/28/17 0918 11/28/17 1437  BP: (!) 132/52 127/66 (!) 133/55 128/60  Pulse: 69 (!) 57 60 68  Resp: 18 18 20 20   Temp: 99.2 F (37.3 C) 98.6 F (37 C) 98.6 F (37 C) 98.7 F (37.1 C)  TempSrc: Oral Oral Oral Oral  SpO2: 99% 96% 99% 99%  Weight:      Height:        General: Pt is alert, awake, not in acute distress Cardiovascular: RRR, S1/S2 +, no rubs, no gallops Respiratory: CTA bilaterally, no wheezing, no rhonchi Abdominal: Soft, NT, ND, bowel sounds + Extremities: no edema, no cyanosis    The results of significant diagnostics from this hospitalization (including imaging, microbiology, ancillary and laboratory) are listed below for reference.     Microbiology: Recent Results (from the past 240 hour(s))  Urine culture     Status: Abnormal (Preliminary result)   Collection Time: 11/24/17  8:10 PM  Result Value Ref Range Status   Specimen Description URINE, RANDOM  Final   Special Requests NONE  Final   Culture (A)  Final    20,000 COLONIES/mL GRAM POSITIVE COCCI IDENTIFICATION TO FOLLOW    Report Status PENDING  Incomplete  Blood culture (routine x 2)     Status: None (Preliminary result)   Collection Time: 11/24/17  9:02 PM  Result Value Ref Range Status   Specimen Description BLOOD RIGHT ARM  Final   Special Requests   Final    BOTTLES DRAWN AEROBIC AND ANAEROBIC Blood Culture adequate volume   Culture NO GROWTH 4 DAYS  Final   Report Status PENDING  Incomplete  Blood culture (routine x 2)     Status: None (Preliminary result)    Collection Time: 11/24/17  9:11 PM  Result Value Ref Range Status   Specimen Description BLOOD RIGHT ANTECUBITAL  Final   Special Requests   Final    BOTTLES DRAWN AEROBIC AND ANAEROBIC Blood Culture adequate volume   Culture NO GROWTH  4 DAYS  Final   Report Status PENDING  Incomplete     Labs: BNP (last 3 results) No results for input(s): BNP in the last 8760 hours. Basic Metabolic Panel: Recent Labs  Lab 11/24/17 1742 11/25/17 0417 11/26/17 0306 11/27/17 1106 11/28/17 0536  NA 131* 134* 130* 132* 136  K 3.1* 3.1* 2.9* 3.2* 3.0*  CL 96* 99* 101 103 109  CO2 25 24 21* 19* 19*  GLUCOSE 207* 176* 196* 247* 173*  BUN 27* 28* 25* 17 13  CREATININE 1.95* 2.08* 1.59* 1.17* 1.17*  CALCIUM 8.7* 8.8* 7.8* 7.4* 7.6*  MG  --   --  1.6* 1.5* 1.4*   Liver Function Tests: Recent Labs  Lab 11/24/17 1742  AST 44*  ALT 23  ALKPHOS 56  BILITOT 1.2  PROT 6.3*  ALBUMIN 3.2*   No results for input(s): LIPASE, AMYLASE in the last 168 hours. No results for input(s): AMMONIA in the last 168 hours. CBC: Recent Labs  Lab 11/24/17 1742 11/25/17 0417 11/26/17 0306 11/27/17 1106 11/28/17 0536  WBC 13.1* 14.0* 12.5* 10.5 10.2  NEUTROABS 11.7*  --   --   --   --   HGB 12.8 13.1 10.9* 11.1* 10.4*  HCT 38.4 40.8 33.5* 34.2* 31.5*  MCV 90.8 91.7 90.1 91.0 90.0  PLT 229 232 179 213 226   Cardiac Enzymes: No results for input(s): CKTOTAL, CKMB, CKMBINDEX, TROPONINI in the last 168 hours. BNP: Invalid input(s): POCBNP CBG: Recent Labs  Lab 11/27/17 1830 11/27/17 2149 11/28/17 0615 11/28/17 1123 11/28/17 1338  GLUCAP 233* 219* 170* 175* 208*   D-Dimer No results for input(s): DDIMER in the last 72 hours. Hgb A1c No results for input(s): HGBA1C in the last 72 hours. Lipid Profile No results for input(s): CHOL, HDL, LDLCALC, TRIG, CHOLHDL, LDLDIRECT in the last 72 hours. Thyroid function studies No results for input(s): TSH, T4TOTAL, T3FREE, THYROIDAB in the last 72  hours.  Invalid input(s): FREET3 Anemia work up No results for input(s): VITAMINB12, FOLATE, FERRITIN, TIBC, IRON, RETICCTPCT in the last 72 hours. Urinalysis    Component Value Date/Time   COLORURINE YELLOW 11/24/2017 1906   APPEARANCEUR HAZY (A) 11/24/2017 1906   LABSPEC 1.018 11/24/2017 1906   PHURINE 5.0 11/24/2017 1906   GLUCOSEU NEGATIVE 11/24/2017 1906   HGBUR LARGE (A) 11/24/2017 [place] NEGATIVE 11/24/2017 1906   KETONESUR 5 (A) 11/24/2017 1906   PROTEINUR 100 (A) 11/24/2017 1906   NITRITE NEGATIVE 11/24/2017 1906   LEUKOCYTESUR NEGATIVE 11/24/2017 1906   Sepsis Labs Invalid input(s): PROCALCITONIN,  WBC,  LACTICIDVEN Microbiology Recent Results (from the past 240 hour(s))  Urine culture     Status: Abnormal (Preliminary result)   Collection Time: 11/24/17  8:10 PM  Result Value Ref Range Status   Specimen Description URINE, RANDOM  Final   Special Requests NONE  Final   Culture (A)  Final    20,000 COLONIES/mL GRAM POSITIVE COCCI IDENTIFICATION TO FOLLOW    Report Status PENDING  Incomplete  Blood culture (routine x 2)     Status: None (Preliminary result)   Collection Time: 11/24/17  9:02 PM  Result Value Ref Range Status   Specimen Description BLOOD RIGHT ARM  Final   Special Requests   Final    BOTTLES DRAWN AEROBIC AND ANAEROBIC Blood Culture adequate volume   Culture NO GROWTH 4 DAYS  Final   Report Status PENDING  Incomplete  Blood culture (routine x 2)     Status: None (  Preliminary result)   Collection Time: 11/24/17  9:11 PM  Result Value Ref Range Status   Specimen Description BLOOD RIGHT ANTECUBITAL  Final   Special Requests   Final    BOTTLES DRAWN AEROBIC AND ANAEROBIC Blood Culture adequate volume   Culture NO GROWTH 4 DAYS  Final   Report Status PENDING  Incomplete     Time coordinating discharge: Over 30 minutes  SIGNED:   Debbe Odea, MD  [facility] 11/28/2017, 2:51 PM Pager   If 7PM-7AM, please contact  night-coverage [email or web address] Password [email or web address]

## 2017-11-29 LAB — URINE CULTURE

## 2017-11-29 LAB — CULTURE, BLOOD (ROUTINE X 2)
CULTURE: NO GROWTH
Culture: NO GROWTH
SPECIAL REQUESTS: ADEQUATE
SPECIAL REQUESTS: ADEQUATE

## 2017-11-29 NOTE — Care Management Note (Signed)
Case Management Note  Patient Details  Name: Peggy Chen MRN: [id number] Date of Birth: November 12, 1951  Subjective/Objective:                    Action/Plan: 11/29/2017 at 11:00 am: pt discharged yesterday with orders for [facility] services. CM called and spoke to the patient this am and provided her choice of [facility] agencies. She selected [facility]. Jermaine with [facility] notified and accepted the referral. Pt with orders for walker but states she has a walker at home. Pt did request a 3 in 1. CM asked Dr Wynelle [place] and orders faxed to [facility].   Expected Discharge Date:  11/28/17               Expected Discharge Plan:  [place]  In-House Referral:     Discharge planning Services  CM Consult  Post Acute Care Choice:  Home Health, Durable Medical Equipment Choice offered to:  Patient  DME Arranged:  3-N-1 DME Agency:     HH Arranged:  PT [place]:  [place]  Status of Service:  Completed, signed off  If discussed at [place] of Stay Meetings, dates discussed:    Additional Comments:  Pollie Friar, RN 11/29/2017, 11:04 AM

## 2017-12-01 ENCOUNTER — Other Ambulatory Visit: Payer: Self-pay | Admitting: Urology

## 2017-12-01 DIAGNOSIS — Z960 Presence of urogenital implants: Secondary | ICD-10-CM | POA: Diagnosis not present

## 2017-12-01 DIAGNOSIS — Z7982 Long term (current) use of aspirin: Secondary | ICD-10-CM | POA: Diagnosis not present

## 2017-12-01 DIAGNOSIS — Z9181 History of falling: Secondary | ICD-10-CM | POA: Diagnosis not present

## 2017-12-01 DIAGNOSIS — Z7901 Long term (current) use of anticoagulants: Secondary | ICD-10-CM | POA: Diagnosis not present

## 2017-12-01 DIAGNOSIS — Z87891 Personal history of nicotine dependence: Secondary | ICD-10-CM | POA: Diagnosis not present

## 2017-12-01 DIAGNOSIS — I480 Paroxysmal atrial fibrillation: Secondary | ICD-10-CM | POA: Diagnosis not present

## 2017-12-01 DIAGNOSIS — E785 Hyperlipidemia, unspecified: Secondary | ICD-10-CM | POA: Diagnosis not present

## 2017-12-01 DIAGNOSIS — I10 Essential (primary) hypertension: Secondary | ICD-10-CM | POA: Diagnosis not present

## 2017-12-01 DIAGNOSIS — N390 Urinary tract infection, site not specified: Secondary | ICD-10-CM | POA: Diagnosis not present

## 2017-12-01 DIAGNOSIS — R296 Repeated falls: Secondary | ICD-10-CM | POA: Diagnosis not present

## 2017-12-01 DIAGNOSIS — E119 Type 2 diabetes mellitus without complications: Secondary | ICD-10-CM | POA: Diagnosis not present

## 2017-12-02 ENCOUNTER — Other Ambulatory Visit: Payer: Self-pay | Admitting: Urology

## 2017-12-03 DIAGNOSIS — R05 Cough: Secondary | ICD-10-CM | POA: Diagnosis not present

## 2017-12-03 DIAGNOSIS — E876 Hypokalemia: Secondary | ICD-10-CM | POA: Diagnosis not present

## 2017-12-03 DIAGNOSIS — Z7901 Long term (current) use of anticoagulants: Secondary | ICD-10-CM | POA: Diagnosis not present

## 2017-12-03 DIAGNOSIS — Z0000 Encounter for general adult medical examination without abnormal findings: Secondary | ICD-10-CM | POA: Diagnosis not present

## 2017-12-04 NOTE — Discharge Summary (Signed)
Physician Discharge Summary  Peggy Chen VQM:[id number] DOB: [birth year] DOA: 11/24/2017  PCP: [name], MD  Admit date: 11/24/2017 Discharge date: 11/28/2017  Admitted From: home Disposition:  home   Recommendations for Outpatient Follow-up:  1. F/u Bmet, Mg+ EKG in 3-4 days 2. F/u BP and resume medications if Cr improved 3. Resume Metformin if renal function continues to improve  Discharge Condition:  stable   CODE STATUS:  Full code   Consultations:  Urology     Discharge Diagnoses:  Principal Problem:   Sepsis secondary to UTI [facility]) Active Problems:   Paroxysmal atrial fibrillation (HCC)   HTN (hypertension)   Hypokalemia   Falls   Diabetes mellitus type 2, uncomplicated (HCC)   Chronic anticoagulation      HPI:  HPI On 11/24/2017 by Dr. Leana Roe Brownis a 66 y.o.femalewith medical history significant ofPAF on Coumadin, HTN, HLD;who presented with complaints of falls and weakness.Last 2 days she is been having nausea and vomiting in 2-3 times per day unable to keep any significant amount of food or liquids down. Seen yesterday by her PCP diagnosed with a urinary tract infection for which she was started on Bactrim. Reports taking 1 dose of Bactrim. On the day of admission, she fell 3 times that she relates to weakness and loss of balance.Shehit her head against a door hinge during 1 of the falls,but denies any loss of consciousness. She was admitted for sepsis due to a UTI, AKI, metabolic encephalopathy, nausea and vomiting.   Subjective: Has some pain in her "tailbone" due her fall prior to admission. No nausea, vomiting, dysuria or back pain. She did have left sided flank pain on admission. No trouble with ambulation.   ROS: no complaints constipation diarrhea, cough, dyspnea or dysuria. No other complaints.   Assessment & Plan:   Principal Problem:   Sepsis secondary to UTI - with left flank pain - Found to have a left  ureteral stone with left sided hydronephrosis. Urology was consulted and she underwent a cystoscopy and left ureteral stent placement on 12/21. - unfortunately, U cultures have only grown out 20,000 gr + cocci and we are unable to obtain further sensitivities on this- the patient had taken Bactrim prior to arrival and this would explain the poor culture result - based on current culture, her allergy to PCN (hives), her inability to take Bactrim due to AKI (not back to baseline) and nausea/ vomiting as a result of Bactrim, we only have one option to treat this UTI and this is flouroquinolones- I have discussed this with ID as well-  unfortunately, she is also on Flecanide and has low K and Mg+ which I am replacing - she will need a EKG and Bmet and Mg levels in the next few days- I have explained to her that it is important that she f/u with her PCP - urology would like her to stay on 2 wks total of antibiotics - Dr Alinda Money to arrange outpt f/u   Active Problems:  AKI - due to nausea, vomiting, sepsis, hydronephrosis and UTI - baseline cr about 0.9- admitted with Cr 2.08- Cr has improved to 1.17 with above treatment and fluids - see renal imaging studies below  Nausea/ vomiting - ? Due to Bactrim vs UTI and stone- resolved  Hypokalemia and Hypomagnesemia - replacing- will d/c home with Mg and K replacement    Paroxysmal atrial fibrillation  - cont Flecainide, Metoprolol and Coumadin- INR therapeutic  HTN (hypertension) - hold Olmasartan/HCTZ until renal function normalizes    Diabetes mellitus type 2, uncomplicated  - cont Januvia- hold Metformin until renal function determined to be stable as outpt     Discharge Instructions  Discharge Instructions    Diet - low sodium heart healthy   Complete by:  As directed    Diet Carb Modified   Complete by:  As directed    Increase activity slowly   Complete by:  As directed      Allergies as of 11/28/2017      Reactions    Penicillins Hives   Has patient had a PCN reaction causing immediate rash, facial/tongue/throat swelling, SOB or lightheadedness with hypotension: Hives Has patient had a PCN reaction causing severe rash involving mucus membranes or skin necrosis: Unk Has patient had a PCN reaction that required hospitalization: No Has patient had a PCN reaction occurring within the last 10 years: No If all of the above answers are "NO", then may proceed with Cephalosporin use.      Medication List    STOP taking these medications   JANUMET 50-1000 MG tablet Generic drug:  sitaGLIPtin-metformin   olmesartan-hydrochlorothiazide 20-12.5 MG tablet Commonly known as:  BENICAR HCT   sulfamethoxazole-trimethoprim 800-160 MG tablet Commonly known as:  BACTRIM DS,SEPTRA DS     TAKE these medications   allopurinol 100 MG tablet Commonly known as:  ZYLOPRIM Take 100 mg by mouth See admin instructions. 100 mg once a day for 1-2 weeks after gout flares   amLODipine 5 MG tablet Commonly known as:  NORVASC Take 5 mg by mouth daily.   aspirin 81 MG tablet Take 81 mg by mouth daily.   citalopram 10 MG tablet Commonly known as:  CELEXA Take 10 mg by mouth daily.   COLCRYS 0.6 MG tablet Generic drug:  colchicine Take 0.6 mg by mouth daily as needed (for gout flares).   cyanocobalamin 1000 MCG tablet Take 1,000 mcg by mouth daily.   flecainide 150 MG tablet Commonly known as:  TAMBOCOR Take 1 tablet (150 mg total) by mouth 2 (two) times daily.   HYDROcodone-acetaminophen 5-325 MG tablet Commonly known as:  NORCO/VICODIN Take 1 tablet by mouth every 6 (six) hours as needed for moderate pain.   levofloxacin 500 MG tablet Commonly known as:  LEVAQUIN Take 1 tablet (500 mg total) by mouth daily.   Magnesium Oxide 400 MG Caps Take 1 capsule (400 mg total) by mouth daily.   metoprolol 200 MG 24 hr tablet Commonly known as:  TOPROL-XL TAKE ONE TABLET BY MOUTH ONCE DAILY What changed:    how much  to take  how to take this  when to take this   potassium chloride 10 MEQ tablet Commonly known as:  K-DUR Take 2 tablets (20 mEq total) by mouth daily.   sitaGLIPtin 50 MG tablet Commonly known as:  JANUVIA Take 1 tablet (50 mg total) by mouth daily.   warfarin 5 MG tablet Commonly known as:  COUMADIN Take 5-7.5 mg by mouth See admin instructions. 5 mg at bedtime on Sun/Mon/Tues/Thurs/Fri/Sat and 7.5 mg on Wed   zolpidem 10 MG tablet Commonly known as:  AMBIEN Take 10 mg by mouth at bedtime as needed for sleep.      Follow-up Information    Raynelle Bring, MD Follow up.   Specialty:  Urology Why:  Will call to schedule outpatinet follow up visit and surgery. Contact information: [place] [address] [phone]  [name], MD Follow up.   Specialty:  Internal Medicine Why:  please do the following blood work in 3-5 days: Bmet, Magnesium She will also need an EKG to f/u on QT interval due to being both on Flecanide and Levaquin. Contact information: [address] [place] [address] [phone]          Allergies  Allergen Reactions  . Penicillins Hives    Has patient had a PCN reaction causing immediate rash, facial/tongue/throat swelling, SOB or lightheadedness with hypotension: Hives Has patient had a PCN reaction causing severe rash involving mucus membranes or skin necrosis: Unk Has patient had a PCN reaction that required hospitalization: No Has patient had a PCN reaction occurring within the last 10 years: No If all of the above answers are "NO", then may proceed with Cephalosporin use.      Procedures/Studies:  Ureteral stent/ cystoscopy  Ct Head Wo Contrast  Result Date: 11/24/2017 CLINICAL DATA:  Fall with hematoma to the forehead EXAM: CT HEAD WITHOUT CONTRAST CT CERVICAL SPINE WITHOUT CONTRAST TECHNIQUE: Multidetector CT imaging of the head and cervical spine was performed following the standard protocol  without intravenous contrast. Multiplanar CT image reconstructions of the cervical spine were also generated. COMPARISON:  None. FINDINGS: CT HEAD FINDINGS Brain: No acute territorial infarction, hemorrhage, or intracranial mass is visualized. Mild small vessel ischemic changes of the white matter. 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 Normal ventricle size Vascular: No hyperdense vessels.  Carotid artery calcification Skull: No fracture Sinuses/Orbits: Mild mucosal thickening in the maxillary and ethmoid sinuses. No acute orbital abnormality Other: Moderate right forehead hematoma and small posterior scalp swelling at the vertex CT CERVICAL SPINE FINDINGS Alignment: Straightening of the cervical spine. No subluxation. The set alignment within normal limits. Skull base and vertebrae: No acute fracture. No primary bone lesion or focal pathologic process. Soft tissues and spinal canal: No prevertebral fluid or swelling. No visible canal hematoma. Disc levels: Mild degenerative changes at C4-C5 and C5-C6 with moderate changes at C6-C7. Upper chest: Negative. Other: Carotid artery calcification IMPRESSION: 1. No CT evidence for acute intracranial abnormality. Small vessel ischemic changes of the white matter. Moderate right forehead hematoma and small posterior scalp swelling 2. Straightening of the cervical spine with degenerative changes. No definite acute osseous abnormality. Electronically Signed   By: Donavan Foil M.D.   On: 11/24/2017 17:25   Ct Cervical Spine Wo Contrast  Result Date: 11/24/2017 CLINICAL DATA:  Fall with hematoma to the forehead EXAM: CT HEAD WITHOUT CONTRAST CT CERVICAL SPINE WITHOUT CONTRAST TECHNIQUE: Multidetector CT imaging of the head and cervical spine was performed following the standard protocol without intravenous contrast. Multiplanar CT image reconstructions of the cervical spine were also generated. COMPARISON:  None. FINDINGS: CT HEAD FINDINGS Brain: No acute territorial  infarction, hemorrhage, or intracranial mass is visualized. Mild small vessel ischemic changes of the white matter. 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 Normal ventricle size Vascular: No hyperdense vessels.  Carotid artery calcification Skull: No fracture Sinuses/Orbits: Mild mucosal thickening in the maxillary and ethmoid sinuses. No acute orbital abnormality Other: Moderate right forehead hematoma and small posterior scalp swelling at the vertex CT CERVICAL SPINE FINDINGS Alignment: Straightening of the cervical spine. No subluxation. The set alignment within normal limits. Skull base and vertebrae: No acute fracture. No primary bone lesion or focal pathologic process. Soft tissues and spinal canal: No prevertebral fluid or swelling. No visible canal hematoma. Disc levels: Mild degenerative changes at C4-C5 and C5-C6  with moderate changes at C6-C7. Upper chest: Negative. Other: Carotid artery calcification IMPRESSION: 1. No CT evidence for acute intracranial abnormality. Small vessel ischemic changes of the white matter. Moderate right forehead hematoma and small posterior scalp swelling 2. Straightening of the cervical spine with degenerative changes. No definite acute osseous abnormality. Electronically Signed   By: Donavan Foil M.D.   On: 11/24/2017 17:25   US Renal  Result Date: 11/24/2017 CLINICAL DATA:  Acute kidney injury. EXAM: RENAL / URINARY TRACT ULTRASOUND COMPLETE COMPARISON:  None. FINDINGS: Right Kidney: Length: 11.6 cm. Echogenicity within normal limits. No mass or hydronephrosis visualized. Left Kidney: Length: 12.8 cm. Mild hydronephrosis. Subcentimeter cyst in the upper pole. 2.8 cm exophytic cyst from the mid kidney. No evidence of solid lesion. Bladder: Only minimally distended.  Neither ureteral jet was visualized. Incidental note of hepatic steatosis. IMPRESSION: 1. Mild left hydronephrosis, cause not visualized. 2. Left renal cysts. 3. Normal sonographic appearance of the right  kidney. Electronically Signed   By: Jeb Levering M.D.   On: 11/24/2017 23:20   Dg Chest Portable 1 View  Result Date: 11/24/2017 CLINICAL DATA:  Multiple falls EXAM: PORTABLE CHEST 1 VIEW COMPARISON:  04/09/2014 FINDINGS: No consolidation or effusion. Stable cardiomediastinal silhouette with atherosclerosis. No pneumothorax. Rotation felt to augment the mediastinal contour. IMPRESSION: 1. No radiographic evidence for acute cardiopulmonary abnormality 2. Generous mediastinal contour is felt augmented by patient rotation Electronically Signed   By: Donavan Foil M.D.   On: 11/24/2017 21:06   Dg C-arm 1-60 Min-no Report  Result Date: 11/26/2017 Fluoroscopy was utilized by the requesting physician.  No radiographic interpretation.   Ct Renal Stone Study  Result Date: 11/25/2017 CLINICAL DATA:  Left side abdominal pain. Hydronephrosis on the left by renal ultrasound 11/24/2017. EXAM: CT ABDOMEN AND PELVIS WITHOUT CONTRAST TECHNIQUE: Multidetector CT imaging of the abdomen and pelvis was performed following the standard protocol without IV contrast. COMPARISON:  Renal ultrasound 11/24/2017. FINDINGS: Lower chest: Mild dependent atelectasis. Lung bases otherwise clear. No pleural or pericardial effusion. Hepatobiliary: The liver is diffusely low attenuating consistent with fatty infiltration. No focal lesion. The patient is status post cholecystectomy. Biliary tree is unremarkable. Pancreas: Unremarkable. No pancreatic ductal dilatation or surrounding inflammatory changes. Spleen: Normal in size without focal abnormality. Adrenals/Urinary Tract: The left kidney appears edematous with swelling about it. There is mild left hydronephrosis with stranding about the left kidney and ureter. A 0.5 cm distal left ureteral stone is identified approximately 3 cm proximal to the ureterovesical junction. No other urinary tract stones are identified on the right or left. No urinary bladder stones. The adrenal glands  appear normal. Stomach/Bowel: Surgical anastomosis in the sigmoid colon is identified. There is some scattered diverticulosis without diverticulitis. The stomach and small bowel appear normal. Vascular/Lymphatic: No significant vascular findings are present. No enlarged abdominal or pelvic lymph nodes. Reproductive: Uterus and bilateral adnexa are unremarkable. Other: The patient has a right of midline hernia containing loops of bowel without obstruction or other complicating feature. The hernia defect measures 6.8 cm transverse by by 4.3 cm craniocaudal. 2 midline ventral hernias are seen in the pelvis. The more superior is larger where a defect measuring approximately 6 cm craniocaudal by 2.3 cm transverse is present. There are loops of bowel within this morning without obstruction or other complicating feature. A smaller fat containing ventral hernia is seen more inferiorly. Musculoskeletal: No fracture or worrisome lesion. Lower lumbar spondylosis is noted IMPRESSION: Mild left hydronephrosis with stranding about the left  kidney and ureter due to a 0.5 cm distal left ureteral stone. The left kidney appears edematous compared to the right. Three abdominal wall hernias are identified. Two of these contain loops of bowel without obstruction other complicating feature. Fatty infiltration of the liver. Mild diverticulosis without diverticulitis. Atherosclerosis. Electronically Signed   By: Inge Rise M.D.   On: 11/25/2017 15:03   Dg Hip Unilat With Pelvis 2-3 Views Left  Result Date: 11/24/2017 CLINICAL DATA:  Left hip pain secondary to a fall. EXAM: DG HIP (WITH OR WITHOUT PELVIS) 2-3V LEFT COMPARISON:  None. FINDINGS: There is no evidence of hip fracture or dislocation. There is no evidence of arthropathy or other focal bone abnormality. IMPRESSION: Negative. Electronically Signed   By: Lorriane Shire M.D.   On: 11/24/2017 16:54       Discharge Exam: Vitals:   11/28/17 0918 11/28/17 1437  BP: (!)  133/55 128/60  Pulse: 60 68  Resp: 20 20  Temp: 98.6 F (37 C) 98.7 F (37.1 C)  SpO2: 99% 99%   Vitals:   11/28/17 0101 11/28/17 0441 11/28/17 0918 11/28/17 1437  BP: (!) 132/52 127/66 (!) 133/55 128/60  Pulse: 69 (!) 57 60 68  Resp: 18 18 20 20   Temp: 99.2 F (37.3 C) 98.6 F (37 C) 98.6 F (37 C) 98.7 F (37.1 C)  TempSrc: Oral Oral Oral Oral  SpO2: 99% 96% 99% 99%  Weight:      Height:        General: Pt is alert, awake, not in acute distress Cardiovascular: RRR, S1/S2 +, no rubs, no gallops Respiratory: CTA bilaterally, no wheezing, no rhonchi Abdominal: Soft, NT, ND, bowel sounds + Extremities: no edema, no cyanosis    The results of significant diagnostics from this hospitalization (including imaging, microbiology, ancillary and laboratory) are listed below for reference.     Microbiology: Recent Results (from the past 240 hour(s))  Urine culture     Status: Abnormal   Collection Time: 11/24/17  8:10 PM  Result Value Ref Range Status   Specimen Description URINE, RANDOM  Final   Special Requests NONE  Final   Culture 20,000 COLONIES/mL ALLIOCOCCUS OTITIS   (A)  Final   Report Status 11/29/2017 FINAL  Final  Blood culture (routine x 2)     Status: None   Collection Time: 11/24/17  9:02 PM  Result Value Ref Range Status   Specimen Description BLOOD RIGHT ARM  Final   Special Requests   Final    BOTTLES DRAWN AEROBIC AND ANAEROBIC Blood Culture adequate volume   Culture NO GROWTH 5 DAYS  Final   Report Status 11/29/2017 FINAL  Final  Blood culture (routine x 2)     Status: None   Collection Time: 11/24/17  9:11 PM  Result Value Ref Range Status   Specimen Description BLOOD RIGHT ANTECUBITAL  Final   Special Requests   Final    BOTTLES DRAWN AEROBIC AND ANAEROBIC Blood Culture adequate volume   Culture NO GROWTH 5 DAYS  Final   Report Status 11/29/2017 FINAL  Final     Labs: BNP (last 3 results) No results for input(s): BNP in the last 8760  hours. Basic Metabolic Panel: Recent Labs  Lab 11/27/17 1106 11/28/17 0536  NA 132* 136  K 3.2* 3.0*  CL 103 109  CO2 19* 19*  GLUCOSE 247* 173*  BUN 17 13  CREATININE 1.17* 1.17*  CALCIUM 7.4* 7.6*  MG 1.5* 1.4*   Liver Function Tests:  No results for input(s): AST, ALT, ALKPHOS, BILITOT, PROT, ALBUMIN in the last 168 hours. No results for input(s): LIPASE, AMYLASE in the last 168 hours. No results for input(s): AMMONIA in the last 168 hours. CBC: Recent Labs  Lab 11/27/17 1106 11/28/17 0536  WBC 10.5 10.2  HGB 11.1* 10.4*  HCT 34.2* 31.5*  MCV 91.0 90.0  PLT 213 226   Cardiac Enzymes: No results for input(s): CKTOTAL, CKMB, CKMBINDEX, TROPONINI in the last 168 hours. BNP: Invalid input(s): POCBNP CBG: Recent Labs  Lab 11/27/17 2149 11/28/17 0615 11/28/17 1123 11/28/17 1338 11/28/17 1721  GLUCAP 219* 170* 175* 208* 178*   D-Dimer No results for input(s): DDIMER in the last 72 hours. Hgb A1c No results for input(s): HGBA1C in the last 72 hours. Lipid Profile No results for input(s): CHOL, HDL, LDLCALC, TRIG, CHOLHDL, LDLDIRECT in the last 72 hours. Thyroid function studies No results for input(s): TSH, T4TOTAL, T3FREE, THYROIDAB in the last 72 hours.  Invalid input(s): FREET3 Anemia work up No results for input(s): VITAMINB12, FOLATE, FERRITIN, TIBC, IRON, RETICCTPCT in the last 72 hours. Urinalysis    Component Value Date/Time   COLORURINE YELLOW 11/24/2017 1906   APPEARANCEUR HAZY (A) 11/24/2017 1906   LABSPEC 1.018 11/24/2017 1906   PHURINE 5.0 11/24/2017 1906   GLUCOSEU NEGATIVE 11/24/2017 1906   HGBUR LARGE (A) 11/24/2017 1906   BILIRUBINUR NEGATIVE 11/24/2017 1906   KETONESUR 5 (A) 11/24/2017 1906   PROTEINUR 100 (A) 11/24/2017 1906   NITRITE NEGATIVE 11/24/2017 1906   LEUKOCYTESUR NEGATIVE 11/24/2017 1906   Sepsis Labs Invalid input(s): PROCALCITONIN,  WBC,  LACTICIDVEN Microbiology Recent Results (from the past 240 hour(s))  Urine  culture     Status: Abnormal   Collection Time: 11/24/17  8:10 PM  Result Value Ref Range Status   Specimen Description URINE, RANDOM  Final   Special Requests NONE  Final   Culture 20,000 COLONIES/mL ALLIOCOCCUS OTITIS   (A)  Final   Report Status 11/29/2017 FINAL  Final  Blood culture (routine x 2)     Status: None   Collection Time: 11/24/17  9:02 PM  Result Value Ref Range Status   Specimen Description BLOOD RIGHT ARM  Final   Special Requests   Final    BOTTLES DRAWN AEROBIC AND ANAEROBIC Blood Culture adequate volume   Culture NO GROWTH 5 DAYS  Final   Report Status 11/29/2017 FINAL  Final  Blood culture (routine x 2)     Status: None   Collection Time: 11/24/17  9:11 PM  Result Value Ref Range Status   Specimen Description BLOOD RIGHT ANTECUBITAL  Final   Special Requests   Final    BOTTLES DRAWN AEROBIC AND ANAEROBIC Blood Culture adequate volume   Culture NO GROWTH 5 DAYS  Final   Report Status 11/29/2017 FINAL  Final     Time coordinating discharge: Over 30 minutes  SIGNED:   Debbe Odea, MD  [facility] 11/28/2017, 8:23 AM Pager   If 7PM-7AM, please contact night-coverage [email or web address] Password [email or web address]

## 2017-12-06 DIAGNOSIS — E785 Hyperlipidemia, unspecified: Secondary | ICD-10-CM | POA: Diagnosis not present

## 2017-12-06 DIAGNOSIS — N390 Urinary tract infection, site not specified: Secondary | ICD-10-CM | POA: Diagnosis not present

## 2017-12-06 DIAGNOSIS — I10 Essential (primary) hypertension: Secondary | ICD-10-CM | POA: Diagnosis not present

## 2017-12-06 DIAGNOSIS — E119 Type 2 diabetes mellitus without complications: Secondary | ICD-10-CM | POA: Diagnosis not present

## 2017-12-06 DIAGNOSIS — R296 Repeated falls: Secondary | ICD-10-CM | POA: Diagnosis not present

## 2017-12-06 DIAGNOSIS — I480 Paroxysmal atrial fibrillation: Secondary | ICD-10-CM | POA: Diagnosis not present

## 2017-12-08 DIAGNOSIS — I10 Essential (primary) hypertension: Secondary | ICD-10-CM | POA: Diagnosis not present

## 2017-12-08 DIAGNOSIS — E119 Type 2 diabetes mellitus without complications: Secondary | ICD-10-CM | POA: Diagnosis not present

## 2017-12-08 DIAGNOSIS — R296 Repeated falls: Secondary | ICD-10-CM | POA: Diagnosis not present

## 2017-12-08 DIAGNOSIS — I480 Paroxysmal atrial fibrillation: Secondary | ICD-10-CM | POA: Diagnosis not present

## 2017-12-08 DIAGNOSIS — E785 Hyperlipidemia, unspecified: Secondary | ICD-10-CM | POA: Diagnosis not present

## 2017-12-08 DIAGNOSIS — N390 Urinary tract infection, site not specified: Secondary | ICD-10-CM | POA: Diagnosis not present

## 2017-12-10 DIAGNOSIS — R296 Repeated falls: Secondary | ICD-10-CM | POA: Diagnosis not present

## 2017-12-10 DIAGNOSIS — M533 Sacrococcygeal disorders, not elsewhere classified: Secondary | ICD-10-CM | POA: Diagnosis not present

## 2017-12-10 DIAGNOSIS — M545 Low back pain: Secondary | ICD-10-CM | POA: Diagnosis not present

## 2017-12-10 DIAGNOSIS — M47816 Spondylosis without myelopathy or radiculopathy, lumbar region: Secondary | ICD-10-CM | POA: Diagnosis not present

## 2017-12-10 DIAGNOSIS — E785 Hyperlipidemia, unspecified: Secondary | ICD-10-CM | POA: Diagnosis not present

## 2017-12-10 DIAGNOSIS — N390 Urinary tract infection, site not specified: Secondary | ICD-10-CM | POA: Diagnosis not present

## 2017-12-10 DIAGNOSIS — E119 Type 2 diabetes mellitus without complications: Secondary | ICD-10-CM | POA: Diagnosis not present

## 2017-12-10 DIAGNOSIS — M4316 Spondylolisthesis, lumbar region: Secondary | ICD-10-CM | POA: Diagnosis not present

## 2017-12-10 DIAGNOSIS — I480 Paroxysmal atrial fibrillation: Secondary | ICD-10-CM | POA: Diagnosis not present

## 2017-12-10 DIAGNOSIS — I10 Essential (primary) hypertension: Secondary | ICD-10-CM | POA: Diagnosis not present

## 2017-12-13 DIAGNOSIS — I10 Essential (primary) hypertension: Secondary | ICD-10-CM | POA: Diagnosis not present

## 2017-12-13 DIAGNOSIS — E119 Type 2 diabetes mellitus without complications: Secondary | ICD-10-CM | POA: Diagnosis not present

## 2017-12-13 DIAGNOSIS — E785 Hyperlipidemia, unspecified: Secondary | ICD-10-CM | POA: Diagnosis not present

## 2017-12-13 DIAGNOSIS — I480 Paroxysmal atrial fibrillation: Secondary | ICD-10-CM | POA: Diagnosis not present

## 2017-12-13 DIAGNOSIS — R296 Repeated falls: Secondary | ICD-10-CM | POA: Diagnosis not present

## 2017-12-13 DIAGNOSIS — N390 Urinary tract infection, site not specified: Secondary | ICD-10-CM | POA: Diagnosis not present

## 2017-12-14 DIAGNOSIS — I482 Chronic atrial fibrillation: Secondary | ICD-10-CM | POA: Diagnosis not present

## 2017-12-14 DIAGNOSIS — N201 Calculus of ureter: Secondary | ICD-10-CM | POA: Diagnosis not present

## 2017-12-15 DIAGNOSIS — E119 Type 2 diabetes mellitus without complications: Secondary | ICD-10-CM | POA: Diagnosis not present

## 2017-12-15 DIAGNOSIS — E785 Hyperlipidemia, unspecified: Secondary | ICD-10-CM | POA: Diagnosis not present

## 2017-12-15 DIAGNOSIS — R296 Repeated falls: Secondary | ICD-10-CM | POA: Diagnosis not present

## 2017-12-15 DIAGNOSIS — I10 Essential (primary) hypertension: Secondary | ICD-10-CM | POA: Diagnosis not present

## 2017-12-15 DIAGNOSIS — N390 Urinary tract infection, site not specified: Secondary | ICD-10-CM | POA: Diagnosis not present

## 2017-12-15 DIAGNOSIS — I480 Paroxysmal atrial fibrillation: Secondary | ICD-10-CM | POA: Diagnosis not present

## 2017-12-16 ENCOUNTER — Encounter (HOSPITAL_COMMUNITY): Payer: Self-pay

## 2017-12-16 NOTE — Patient Instructions (Addendum)
Your procedure is scheduled on: Thursday, Jan. 17, 2019   Surgery Time:  7:15AM-8:15AM   Report to [place]  Entrance   Follow map to Short Stay on first floor at 5:30 AM   Call this number if you have problems the morning of surgery [phone]   Do not eat food or drink liquids :After Midnight.   Do NOT smoke after Midnight   Take these medicines the morning of surgery with A SIP OF WATER: Amlodipine, Metoprolol, Flecainide   DO NOT TAKE ANY DIABETIC MEDICATIONS DAY OF YOUR SURGERY                               You may not have any metal on your body including hair pins, jewelry, and body piercings             Do not wear make-up, lotions, powders, perfumes/cologne, or deodorant             Do not wear nail polish.  Do not shave  48 hours prior to surgery.        Do not bring valuables to the hospital. [place].   Contacts, dentures or bridgework may not be worn into surgery.    Patients discharged the day of surgery will not be allowed to drive home.   Name and phone number of your driver:   Special Instructions: Bring a copy of your healthcare power of attorney and living will documents         the day of surgery if you haven't scanned them in before.              Please read over the following fact sheets you were given:   [facility] - Preparing for Surgery Before surgery, you can play an important role.  Because skin is not sterile, your skin needs to be as free of germs as possible.  You can reduce the number of germs on your skin by washing with CHG (chlorahexidine gluconate) soap before surgery.  CHG is an antiseptic cleaner which kills germs and bonds with the skin to continue killing germs even after washing. Please DO NOT use if you have an allergy to CHG or antibacterial soaps.  If your skin becomes reddened/irritated stop using the CHG and inform your nurse when you arrive at Short Stay. Do  not shave (including legs and underarms) for at least 48 hours prior to the first CHG shower.  You may shave your face/neck.  Please follow these instructions carefully:  1.  Shower with CHG Soap the night before surgery and the  morning of surgery.  2.  If you choose to wash your hair, wash your hair first as usual with your normal  shampoo.  3.  After you shampoo, rinse your hair and body thoroughly to remove the shampoo.                             4.  Use CHG as you would any other liquid soap.  You can apply chg directly to the skin and wash.  Gently with a scrungie or clean washcloth.  5.  Apply the CHG Soap to your body ONLY FROM THE NECK DOWN.   Do   not use on  face/ open                           Wound or open sores. Avoid contact with eyes, ears mouth and   genitals (private parts).                       Wash face,  Genitals (private parts) with your normal soap.             6.  Wash thoroughly, paying special attention to the area where your    surgery  will be performed.  7.  Thoroughly rinse your body with warm water from the neck down.  8.  DO NOT shower/wash with your normal soap after using and rinsing off the CHG Soap.                9.  Pat yourself dry with a clean towel.            10.  Wear clean pajamas.            11.  Place clean sheets on your bed the night of your first shower and do not  sleep with pets. Day of Surgery : Do not apply any lotions/deodorants the morning of surgery.  Please wear clean clothes to the hospital/surgery center.  FAILURE TO FOLLOW THESE INSTRUCTIONS MAY RESULT IN THE CANCELLATION OF YOUR SURGERY  PATIENT SIGNATURE_________________________________  NURSE SIGNATURE__________________________________  ________________________________________________________________________ How to Manage Your Diabetes Before and After Surgery  Why is it important to control my blood sugar before and after surgery? . Improving blood sugar levels before and  after surgery helps healing and can limit problems. . A way of improving blood sugar control is eating a healthy diet by: o  Eating less sugar and carbohydrates o  Increasing activity/exercise o  Talking with your doctor about reaching your blood sugar goals . High blood sugars (greater than 180 mg/dL) can raise your risk of infections and slow your recovery, so you will need to focus on controlling your diabetes during the weeks before surgery. . Make sure that the doctor who takes care of your diabetes knows about your planned surgery including the date and location.  How do I manage my blood sugar before surgery? . Check your blood sugar at least 4 times a day, starting 2 days before surgery, to make sure that the level is not too high or low. o Check your blood sugar the morning of your surgery when you wake up and every 2 hours until you get to the Short Stay unit. . If your blood sugar is less than 70 mg/dL, you will need to treat for low blood sugar: o Do not take insulin. o Treat a low blood sugar (less than 70 mg/dL) with  cup of clear juice (cranberry or apple), 4 glucose tablets, OR glucose gel. o Recheck blood sugar in 15 minutes after treatment (to make sure it is greater than 70 mg/dL). If your blood sugar is not greater than 70 mg/dL on recheck, call [phone] for further instructions. . Report your blood sugar to the short stay nurse when you get to Short Stay.  . If you are admitted to the hospital after surgery: o Your blood sugar will be checked by the staff and you will probably be given insulin after surgery (instead of oral diabetes medicines) to make sure you have good blood sugar levels. o The goal for blood  sugar control after surgery is 80-180 mg/dL.

## 2017-12-16 NOTE — Pre-Procedure Instructions (Addendum)
The following are in epic: EKG 11/25/17 CXR 11/24/17 US renal and CT renal 11/24/17 Last office visit note 10/21/17 with Dr. Curly Shores   CBC and BMP results 12/02/17 from Dr. Steva Ready in chart Last office visit note 12/02/17 B. [place],NP in chart

## 2017-12-17 DIAGNOSIS — E119 Type 2 diabetes mellitus without complications: Secondary | ICD-10-CM | POA: Diagnosis not present

## 2017-12-17 DIAGNOSIS — I480 Paroxysmal atrial fibrillation: Secondary | ICD-10-CM | POA: Diagnosis not present

## 2017-12-17 DIAGNOSIS — R296 Repeated falls: Secondary | ICD-10-CM | POA: Diagnosis not present

## 2017-12-17 DIAGNOSIS — E785 Hyperlipidemia, unspecified: Secondary | ICD-10-CM | POA: Diagnosis not present

## 2017-12-17 DIAGNOSIS — I10 Essential (primary) hypertension: Secondary | ICD-10-CM | POA: Diagnosis not present

## 2017-12-17 DIAGNOSIS — N390 Urinary tract infection, site not specified: Secondary | ICD-10-CM | POA: Diagnosis not present

## 2017-12-20 ENCOUNTER — Encounter (HOSPITAL_COMMUNITY): Payer: Self-pay

## 2017-12-20 ENCOUNTER — Encounter (HOSPITAL_COMMUNITY)
Admission: RE | Admit: 2017-12-20 | Discharge: 2017-12-20 | Disposition: A | Discharge status: Home / Self Care | Payer: Medicare Other | Source: Ambulatory Visit | Attending: Urology | Admitting: Urology

## 2017-12-20 ENCOUNTER — Encounter (INDEPENDENT_AMBULATORY_CARE_PROVIDER_SITE_OTHER): Payer: Self-pay

## 2017-12-20 ENCOUNTER — Other Ambulatory Visit: Payer: Self-pay

## 2017-12-20 DIAGNOSIS — R3915 Urgency of urination: Secondary | ICD-10-CM | POA: Diagnosis not present

## 2017-12-20 DIAGNOSIS — N390 Urinary tract infection, site not specified: Secondary | ICD-10-CM | POA: Diagnosis not present

## 2017-12-20 DIAGNOSIS — E119 Type 2 diabetes mellitus without complications: Secondary | ICD-10-CM | POA: Diagnosis not present

## 2017-12-20 DIAGNOSIS — Z87442 Personal history of urinary calculi: Secondary | ICD-10-CM | POA: Diagnosis not present

## 2017-12-20 DIAGNOSIS — Z7901 Long term (current) use of anticoagulants: Secondary | ICD-10-CM | POA: Diagnosis not present

## 2017-12-20 DIAGNOSIS — R296 Repeated falls: Secondary | ICD-10-CM | POA: Diagnosis not present

## 2017-12-20 DIAGNOSIS — Z87891 Personal history of nicotine dependence: Secondary | ICD-10-CM | POA: Diagnosis not present

## 2017-12-20 DIAGNOSIS — Z09 Encounter for follow-up examination after completed treatment for conditions other than malignant neoplasm: Secondary | ICD-10-CM | POA: Diagnosis not present

## 2017-12-20 DIAGNOSIS — I4891 Unspecified atrial fibrillation: Secondary | ICD-10-CM | POA: Diagnosis not present

## 2017-12-20 DIAGNOSIS — Z7984 Long term (current) use of oral hypoglycemic drugs: Secondary | ICD-10-CM | POA: Diagnosis not present

## 2017-12-20 DIAGNOSIS — I10 Essential (primary) hypertension: Secondary | ICD-10-CM | POA: Diagnosis not present

## 2017-12-20 DIAGNOSIS — N201 Calculus of ureter: Secondary | ICD-10-CM | POA: Diagnosis present

## 2017-12-20 DIAGNOSIS — E785 Hyperlipidemia, unspecified: Secondary | ICD-10-CM | POA: Diagnosis not present

## 2017-12-20 DIAGNOSIS — Z79899 Other long term (current) drug therapy: Secondary | ICD-10-CM | POA: Diagnosis not present

## 2017-12-20 DIAGNOSIS — Z466 Encounter for fitting and adjustment of urinary device: Secondary | ICD-10-CM | POA: Diagnosis not present

## 2017-12-20 DIAGNOSIS — I480 Paroxysmal atrial fibrillation: Secondary | ICD-10-CM | POA: Diagnosis not present

## 2017-12-20 DIAGNOSIS — Z7982 Long term (current) use of aspirin: Secondary | ICD-10-CM | POA: Diagnosis not present

## 2017-12-20 DIAGNOSIS — Z960 Presence of urogenital implants: Secondary | ICD-10-CM | POA: Diagnosis not present

## 2017-12-20 DIAGNOSIS — Z880 Allergy status to penicillin: Secondary | ICD-10-CM | POA: Diagnosis not present

## 2017-12-20 HISTORY — DX: Personal history of urinary calculi: Z87.442

## 2017-12-20 HISTORY — DX: Unspecified asthma, uncomplicated: J45.909

## 2017-12-20 HISTORY — DX: Fatty (change of) liver, not elsewhere classified: K76.0

## 2017-12-20 HISTORY — DX: Anxiety disorder, unspecified: F41.9

## 2017-12-20 HISTORY — DX: Urinary tract infection, site not specified: N39.0

## 2017-12-20 HISTORY — DX: Cyst of kidney, acquired: N28.1

## 2017-12-20 HISTORY — DX: Unspecified atherosclerosis: I70.90

## 2017-12-20 HISTORY — DX: Malignant (primary) neoplasm, unspecified: C80.1

## 2017-12-20 HISTORY — DX: Diverticulosis of intestine, part unspecified, without perforation or abscess without bleeding: K57.90

## 2017-12-20 HISTORY — DX: Unspecified osteoarthritis, unspecified site: M19.90

## 2017-12-20 HISTORY — DX: Depression, unspecified: F32.A

## 2017-12-20 HISTORY — DX: Unspecified hydronephrosis: N13.30

## 2017-12-20 HISTORY — DX: Major depressive disorder, single episode, unspecified: F32.9

## 2017-12-20 HISTORY — DX: Dyspnea, unspecified: R06.00

## 2017-12-20 LAB — HEMOGLOBIN A1C
Hgb A1c MFr Bld: 6.7 % — ABNORMAL HIGH (ref 4.8–5.6)
MEAN PLASMA GLUCOSE: 145.59 mg/dL

## 2017-12-20 LAB — GLUCOSE, CAPILLARY: GLUCOSE-CAPILLARY: 152 mg/dL — AB (ref 65–99)

## 2017-12-21 NOTE — Pre-Procedure Instructions (Signed)
Hgb A1C results 12/20/17 faxed to Dr. Alinda Money and Dr. Dagmar Hait via epic.

## 2017-12-22 DIAGNOSIS — R296 Repeated falls: Secondary | ICD-10-CM | POA: Diagnosis not present

## 2017-12-22 DIAGNOSIS — I480 Paroxysmal atrial fibrillation: Secondary | ICD-10-CM | POA: Diagnosis not present

## 2017-12-22 DIAGNOSIS — N390 Urinary tract infection, site not specified: Secondary | ICD-10-CM | POA: Diagnosis not present

## 2017-12-22 DIAGNOSIS — I10 Essential (primary) hypertension: Secondary | ICD-10-CM | POA: Diagnosis not present

## 2017-12-22 DIAGNOSIS — E785 Hyperlipidemia, unspecified: Secondary | ICD-10-CM | POA: Diagnosis not present

## 2017-12-22 DIAGNOSIS — E119 Type 2 diabetes mellitus without complications: Secondary | ICD-10-CM | POA: Diagnosis not present

## 2017-12-22 MED ORDER — GENTAMICIN SULFATE 40 MG/ML IJ SOLN
460.0000 mg | INTRAVENOUS | Status: AC
  Administered 2017-12-23: 461.6 mg via INTRAVENOUS
  Filled 2017-12-22: qty 11.5

## 2017-12-22 NOTE — H&P (Signed)
@media  Print    Office Visit Report 12/14/2017    Peggy Chen. Hanif         MRN: [id number]  PRIMARY CARE:  Montez Morita, MD  DOB: August 24, 1951, 67 year old Female  REFERRING:    SSN: [id number]  PROVIDER:  Raynelle Bring, M.D.    LOCATION:  [facility] [phone]    CC/HPI: Left ureteral calculus   Ms. Peggy Chen returns today after she was seen in the hospital as a consultation over the Christmas holiday. She was noted to have a urinary tract infection and a left ureteral calculus with fever requiring ureteral stent placement. She grew out a bacteria from her original urine culture at [facility] but had a different culture result in the hospital. She ultimately was discharged home on levofloxacin and completed this approximately 3 days ago. She is on warfarin for atrial fibrillation. Her INR was elevated last week when checked around 3.4. She stopped her warfarin for 1 day and has now decreased her dose to 5 mg every day. Currently, she has had some intermittent hematuria. She has had some urgency and frequency although this has been relatively mild. She also has noted some expected left-sided flank pain with her indwelling stent. He follows up today for a repeat urinalysis and culture to ensure that her infection has cleared.     ALLERGIES: Penicillin    MEDICATIONS: Aspirin 81 mg tablet, chewable  Metoprolol Tartrate  Warfarin Sodium 5 mg tablet  Amlodipine Besylate 5 mg tablet  Citalopram Hbr 10 mg tablet  Flecainide Acetate 150 mg tablet  Hydrocodone-Acetaminophen 5 mg-325 mg tablet  Janumet 50 mg-1,000 mg tablet  Magnesium 400 mg capsule  Olmesartan-Hydrochlorothiazide 20 mg-12.5 mg tablet  Vitamin B12  Zolpidem Tartrate 5 mg tablet     GU PSH: Cystoscopy Insert Stent, Left - 11/26/2017    NON-GU PSH: Partial Remove Colon        GU PMH: None     PMH Notes:   1) Urolithiasis: She presented initially as a hospital consult in December 2018 with an  obstructing stone and infection.   Dec 2018: Left ureteral stent   NON-GU PMH: Atrial Fibrillation Diabetes Type 2 Hypertension    FAMILY HISTORY: copd - Mother Heart Disease - Father   SOCIAL HISTORY: Marital Status: Unknown Preferred Language: English; Ethnicity: Not Hispanic Or Latino; Race: White Current Smoking Status: Patient has never smoked.  <DIV'  Tobacco Use Assessment Completed:  Used Tobacco in last 30 days?   Does drink.  Drinks 1 caffeinated drink per day.    REVIEW OF SYSTEMS:     GU Review Female:  Patient reports frequent urination. Patient denies hard to postpone urination, burning /pain with urination, get up at night to urinate, leakage of urine, stream starts and stops, trouble starting your stream, have to strain to urinate, and currently pregnant.    Gastrointestinal (Lower):  Patient reports diarrhea. Patient denies constipation.    Gastrointestinal (Upper):  Patient denies nausea and vomiting.    Constitutional:  Patient denies fever, night sweats, weight loss, and fatigue.    Skin:  Patient denies skin rash/ lesion and itching.    Eyes:  Patient denies blurred vision and double vision.    Ears/ Nose/ Throat:  Patient denies sore throat and sinus problems.    Hematologic/Lymphatic:  Patient denies swollen glands and easy bruising.    Cardiovascular:  Patient denies leg swelling and chest pains.    Respiratory:  Patient  denies cough and shortness of breath.    Endocrine:  Patient denies excessive thirst.    Musculoskeletal:  Patient denies back pain and joint pain.    Neurological:  Patient denies headaches and dizziness.    Psychologic:  Patient denies depression and anxiety.    VITAL SIGNS:       12/14/2017 11:20 AM     Weight 209 lb / 94.8 kg     Height 65 in / 165.1 cm     BP 121/74 mmHg     Pulse 59 /min     BMI 34.8 kg/m     MULTI-SYSTEM PHYSICAL EXAMINATION:      Constitutional: Well-nourished. No physical deformities. Normally developed.  Good grooming.     Respiratory: No labored breathing, no use of accessory muscles. clear bilaterally.     Cardiovascular: Normal temperature, normal extremity pulses, no swelling, no varicosities. Regular rate and rhythm.     Gastrointestinal: No CVA tenderness.            PAST DATA REVIEWED:   Source Of History:  Patient  Urine Test Review:  Urinalysis   Notes:  Urine has been cultured.   PROCEDURES:    Urinalysis w/Scope  Dipstick Dipstick Cont'd Micro  Color: Yellow Bilirubin: Neg WBC/hpf: NS (Not Seen)  Appearance: Cloudy Ketones: Neg RBC/hpf: 20 - 40/hpf  Specific Gravity: 1.025 Blood: 3+ Bacteria: NS (Not Seen)  pH: <=5.0 Protein: 2+ Cystals: NS (Not Seen)  Glucose: Neg Urobilinogen: 0.2 Casts: NS (Not Seen)   Nitrites: Neg Trichomonas: Not Present   Leukocyte Esterase: Neg Mucous: Not Present    Epithelial Cells: NS (Not Seen)    Yeast: NS (Not Seen)    Sperm: Not Present   Notes: microscopic done on unspun specimen     ASSESSMENT:     ICD-10 Details  1 GU:  Ureteral calculus - N20.1   2 NON-GU:  Atrial Fibrillation - I48.2    PLAN:   Orders  Labs Urine Culture, PT/INR  Schedule  Return Visit/Planned Activity: Keep Scheduled Appointment  Document  Letter(s):  Created for Patient: Clinical Summary   Notes:  1. Left ureteral calculus: Considering her recent use of levofloxacin and concomitant warfarin and considering her elevated INR, her INR will be rechecked today to ensure that it is now therapeutic. Assuming that it is, she is currently scheduled for ureteroscopic laser lithotripsy and stone removal on January 17. She can absolutely remain on her warfarin for this procedure assuming that she is at least therapeutic.   We reviewed her planned procedure in detail today including the potential risks, complications, and the expected recovery process. Her urine culture has been repeated to absolutely ensure resolution of her recent infection. She gives informed  consent to proceed.   Cc: Dr. Berneta Sages   * Signed by Raynelle Bring, M.D. on 12/14/17 at 3:52 PM (EST)*

## 2017-12-23 ENCOUNTER — Encounter (HOSPITAL_COMMUNITY)
Admission: RE | Disposition: A | Discharge status: Home / Self Care | Payer: Self-pay | Source: Ambulatory Visit | Attending: Urology

## 2017-12-23 ENCOUNTER — Ambulatory Visit (HOSPITAL_COMMUNITY): Payer: Medicare Other

## 2017-12-23 ENCOUNTER — Ambulatory Visit (HOSPITAL_COMMUNITY): Payer: Medicare Other | Admitting: Anesthesiology

## 2017-12-23 ENCOUNTER — Encounter (HOSPITAL_COMMUNITY): Payer: Self-pay | Admitting: Certified Registered Nurse Anesthetist

## 2017-12-23 ENCOUNTER — Ambulatory Visit (HOSPITAL_COMMUNITY)
Admission: RE | Admit: 2017-12-23 | Discharge: 2017-12-23 | Disposition: A | Discharge status: Home / Self Care | Payer: Medicare Other | Source: Ambulatory Visit | Attending: Urology | Admitting: Urology

## 2017-12-23 DIAGNOSIS — Z87442 Personal history of urinary calculi: Secondary | ICD-10-CM | POA: Insufficient documentation

## 2017-12-23 DIAGNOSIS — R3915 Urgency of urination: Secondary | ICD-10-CM | POA: Insufficient documentation

## 2017-12-23 DIAGNOSIS — I4891 Unspecified atrial fibrillation: Secondary | ICD-10-CM | POA: Insufficient documentation

## 2017-12-23 DIAGNOSIS — Z466 Encounter for fitting and adjustment of urinary device: Secondary | ICD-10-CM | POA: Insufficient documentation

## 2017-12-23 DIAGNOSIS — E119 Type 2 diabetes mellitus without complications: Secondary | ICD-10-CM | POA: Diagnosis not present

## 2017-12-23 DIAGNOSIS — Z711 Person with feared health complaint in whom no diagnosis is made: Secondary | ICD-10-CM | POA: Diagnosis not present

## 2017-12-23 DIAGNOSIS — Z880 Allergy status to penicillin: Secondary | ICD-10-CM | POA: Diagnosis not present

## 2017-12-23 DIAGNOSIS — N201 Calculus of ureter: Secondary | ICD-10-CM | POA: Diagnosis not present

## 2017-12-23 DIAGNOSIS — Z09 Encounter for follow-up examination after completed treatment for conditions other than malignant neoplasm: Secondary | ICD-10-CM | POA: Insufficient documentation

## 2017-12-23 DIAGNOSIS — Z960 Presence of urogenital implants: Secondary | ICD-10-CM | POA: Diagnosis not present

## 2017-12-23 DIAGNOSIS — Z79899 Other long term (current) drug therapy: Secondary | ICD-10-CM | POA: Insufficient documentation

## 2017-12-23 DIAGNOSIS — I10 Essential (primary) hypertension: Secondary | ICD-10-CM | POA: Diagnosis not present

## 2017-12-23 DIAGNOSIS — Z7901 Long term (current) use of anticoagulants: Secondary | ICD-10-CM | POA: Diagnosis not present

## 2017-12-23 DIAGNOSIS — Z87891 Personal history of nicotine dependence: Secondary | ICD-10-CM | POA: Insufficient documentation

## 2017-12-23 DIAGNOSIS — Z7982 Long term (current) use of aspirin: Secondary | ICD-10-CM | POA: Diagnosis not present

## 2017-12-23 DIAGNOSIS — Z7984 Long term (current) use of oral hypoglycemic drugs: Secondary | ICD-10-CM | POA: Insufficient documentation

## 2017-12-23 DIAGNOSIS — E785 Hyperlipidemia, unspecified: Secondary | ICD-10-CM | POA: Diagnosis not present

## 2017-12-23 HISTORY — PX: CYSTOSCOPY/URETEROSCOPY/HOLMIUM LASER/STENT PLACEMENT: SHX6546

## 2017-12-23 LAB — GLUCOSE, CAPILLARY
Glucose-Capillary: 214 mg/dL — ABNORMAL HIGH (ref 65–99)
Glucose-Capillary: 174 mg/dL — ABNORMAL HIGH (ref 65–99)

## 2017-12-23 SURGERY — CYSTOSCOPY/URETEROSCOPY/HOLMIUM LASER/STENT PLACEMENT
Anesthesia: General | Laterality: Left

## 2017-12-23 MED ORDER — FENTANYL CITRATE (PF) 100 MCG/2ML IJ SOLN
INTRAMUSCULAR | Status: DC | PRN
  Administered 2017-12-23: 50 ug via INTRAVENOUS

## 2017-12-23 MED ORDER — HYDROCODONE-ACETAMINOPHEN 5-325 MG PO TABS: 1.0000 | ORAL_TABLET | Freq: Four times a day (QID) | ORAL | 0 refills | Status: DC | PRN

## 2017-12-23 MED ORDER — DEXAMETHASONE SODIUM PHOSPHATE 10 MG/ML IJ SOLN
INTRAMUSCULAR | Status: DC | PRN
  Administered 2017-12-23: 5 mg via INTRAVENOUS

## 2017-12-23 MED ORDER — LACTATED RINGERS IV SOLN
INTRAVENOUS | Status: DC
  Administered 2017-12-23: 07:00:00 via INTRAVENOUS

## 2017-12-23 MED ORDER — FENTANYL CITRATE (PF) 100 MCG/2ML IJ SOLN: 25.0000 ug | INTRAMUSCULAR | Status: DC | PRN

## 2017-12-23 MED ORDER — PHENYLEPHRINE 40 MCG/ML (10ML) SYRINGE FOR IV PUSH (FOR BLOOD PRESSURE SUPPORT)
PREFILLED_SYRINGE | INTRAVENOUS | Status: DC | PRN
  Administered 2017-12-23 (×2): 80 ug via INTRAVENOUS

## 2017-12-23 MED ORDER — MIDAZOLAM HCL 5 MG/5ML IJ SOLN
INTRAMUSCULAR | Status: DC | PRN
  Administered 2017-12-23: 2 mg via INTRAVENOUS

## 2017-12-23 MED ORDER — MIDAZOLAM HCL 2 MG/2ML IJ SOLN
INTRAMUSCULAR | Status: AC
  Filled 2017-12-23: qty 2

## 2017-12-23 MED ORDER — EPHEDRINE SULFATE-NACL 50-0.9 MG/10ML-% IV SOSY
PREFILLED_SYRINGE | INTRAVENOUS | Status: DC | PRN
  Administered 2017-12-23: 15 mg via INTRAVENOUS
  Administered 2017-12-23 (×2): 10 mg via INTRAVENOUS
  Administered 2017-12-23: 15 mg via INTRAVENOUS

## 2017-12-23 MED ORDER — FENTANYL CITRATE (PF) 100 MCG/2ML IJ SOLN
INTRAMUSCULAR | Status: AC
  Filled 2017-12-23: qty 2

## 2017-12-23 MED ORDER — SODIUM CHLORIDE 0.9 % IR SOLN
Status: DC | PRN
  Administered 2017-12-23: 4000 mL via INTRAVESICAL

## 2017-12-23 MED ORDER — LIDOCAINE 2% (20 MG/ML) 5 ML SYRINGE
INTRAMUSCULAR | Status: DC | PRN
  Administered 2017-12-23: 60 mg via INTRAVENOUS

## 2017-12-23 MED ORDER — ONDANSETRON HCL 4 MG/2ML IJ SOLN
INTRAMUSCULAR | Status: DC | PRN
  Administered 2017-12-23: 4 mg via INTRAVENOUS

## 2017-12-23 MED ORDER — LACTATED RINGERS IV SOLN: INTRAVENOUS | Status: DC

## 2017-12-23 MED ORDER — MEPERIDINE HCL 50 MG/ML IJ SOLN: 6.2500 mg | INTRAMUSCULAR | Status: DC | PRN

## 2017-12-23 MED ORDER — METOCLOPRAMIDE HCL 5 MG/ML IJ SOLN: 10.0000 mg | Freq: Once | INTRAMUSCULAR | Status: DC | PRN

## 2017-12-23 MED ORDER — PROPOFOL 10 MG/ML IV BOLUS
INTRAVENOUS | Status: AC
  Filled 2017-12-23: qty 20

## 2017-12-23 MED ORDER — GENTAMICIN SULFATE 40 MG/ML IJ SOLN: 5.0000 mg/kg | Freq: Once | INTRAVENOUS | Status: DC

## 2017-12-23 MED ORDER — PROPOFOL 10 MG/ML IV BOLUS
INTRAVENOUS | Status: DC | PRN
  Administered 2017-12-23: 180 mg via INTRAVENOUS

## 2017-12-23 SURGICAL SUPPLY — 19 items
BAG URO CATCHER STRL LF (MISCELLANEOUS) ×3 IMPLANT
BASKET ZERO TIP NITINOL 2.4FR (BASKET) IMPLANT
CATH INTERMIT  6FR 70CM (CATHETERS) ×3 IMPLANT
CLOTH BEACON ORANGE TIMEOUT ST (SAFETY) ×3 IMPLANT
COVER FOOTSWITCH UNIV (MISCELLANEOUS) IMPLANT
COVER SURGICAL LIGHT HANDLE (MISCELLANEOUS) ×3 IMPLANT
FIBER LASER FLEXIVA 365 (UROLOGICAL SUPPLIES) IMPLANT
FIBER LASER TRAC TIP (UROLOGICAL SUPPLIES) IMPLANT
GLOVE BIOGEL M STRL SZ7.5 (GLOVE) ×3 IMPLANT
GOWN STRL REUS W/TWL LRG LVL3 (GOWN DISPOSABLE) ×6 IMPLANT
GUIDEWIRE ANG ZIPWIRE 038X150 (WIRE) IMPLANT
GUIDEWIRE STR DUAL SENSOR (WIRE) ×3 IMPLANT
IV NS 1000ML (IV SOLUTION)
IV NS 1000ML BAXH (IV SOLUTION) IMPLANT
MANIFOLD NEPTUNE II (INSTRUMENTS) ×3 IMPLANT
PACK CYSTO (CUSTOM PROCEDURE TRAY) ×3 IMPLANT
SHEATH URETERAL 12FRX35CM (MISCELLANEOUS) IMPLANT
TUBING CONNECTING 10 (TUBING) ×2 IMPLANT
TUBING CONNECTING 10' (TUBING) ×1

## 2017-12-23 NOTE — Anesthesia Procedure Notes (Signed)
Procedure Name: LMA Insertion Performed by: Skylarr Liz J, CRNA Pre-anesthesia Checklist: Patient identified, Emergency Drugs available, Suction available, Patient being monitored and Timeout performed Patient Re-evaluated:Patient Re-evaluated prior to induction Oxygen Delivery Method: Circle system utilized Preoxygenation: Pre-oxygenation with 100% oxygen Induction Type: IV induction Ventilation: Mask ventilation without difficulty LMA: LMA inserted LMA Size: 4.0 Number of attempts: 1 Placement Confirmation: positive ETCO2,  CO2 detector and breath sounds checked- equal and bilateral Tube secured with: Tape Dental Injury: Teeth and Oropharynx as per pre-operative assessment        

## 2017-12-23 NOTE — Anesthesia Postprocedure Evaluation (Signed)
Anesthesia Post Note  Patient: Peggy Chen  Procedure(s) Performed: CYSTOSCOPY/ LEFT URETEROSCOPY (Left )     Patient location during evaluation: PACU Anesthesia Type: General Level of consciousness: awake and alert Pain management: pain level controlled Vital Signs Assessment: post-procedure vital signs reviewed and stable Respiratory status: spontaneous breathing, nonlabored ventilation, respiratory function stable and patient connected to nasal cannula oxygen Cardiovascular status: blood pressure returned to baseline and stable Postop Assessment: no apparent nausea or vomiting Anesthetic complications: no    Last Vitals:  Vitals:   12/23/17 0840 12/23/17 0935  BP: (!) 119/53 (!) 110/59  Pulse: 61 64  Resp: 16 16  Temp: 36.4 C 36.9 C  SpO2: 96% 98%    Last Pain:  Vitals:   12/23/17 0935  TempSrc:   PainSc: 0-No pain                 Montez Hageman

## 2017-12-23 NOTE — Anesthesia Preprocedure Evaluation (Signed)
Anesthesia Evaluation  Patient identified by MRN, date of birth, ID band Patient awake    Reviewed: Allergy & Precautions, NPO status , Patient's Chart, lab work & pertinent test results  Airway Mallampati: II  TM Distance: >3 FB Neck ROM: Full    Dental no notable dental hx.    Pulmonary asthma , former smoker,    Pulmonary exam normal breath sounds clear to auscultation       Cardiovascular hypertension, Pt. on medications Normal cardiovascular exam+ dysrhythmias Atrial Fibrillation  Rhythm:Regular Rate:Normal     Neuro/Psych negative neurological ROS  negative psych ROS   GI/Hepatic negative GI ROS, Neg liver ROS,   Endo/Other  diabetes, Type 2, Oral Hypoglycemic Agents  Renal/GU negative Renal ROS  negative genitourinary   Musculoskeletal negative musculoskeletal ROS (+)   Abdominal   Peds negative pediatric ROS (+)  Hematology negative hematology ROS (+)   Anesthesia Other Findings   Reproductive/Obstetrics negative OB ROS                            Anesthesia Physical Anesthesia Plan  ASA: III  Anesthesia Plan: General   Post-op Pain Management:    Induction: Intravenous  PONV Risk Score and Plan: 3 and Ondansetron, Dexamethasone, Midazolam, Scopolamine patch - Pre-op and Treatment may vary due to age or medical condition  Airway Management Planned: LMA  Additional Equipment:   Intra-op Plan:   Post-operative Plan: Extubation in OR  Informed Consent: I have reviewed the patients History and Physical, chart, labs and discussed the procedure including the risks, benefits and alternatives for the proposed anesthesia with the patient or authorized representative who has indicated his/her understanding and acceptance.   Dental advisory given  Plan Discussed with: CRNA  Anesthesia Plan Comments:         Anesthesia Quick Evaluation

## 2017-12-23 NOTE — Progress Notes (Addendum)
0935- Patient ambulatory to bathroom, voided 100 mL of pink urine. Discharge instructions reviewed with patient and patient's friend, Ubaldo Glassing. Both verbalized understanding of teaching, copies of instructions and rx provided. Patient denies pain or nausea.   6440- Pt in wheelchair to lobby to dc home into care of friend, Ubaldo Glassing. No distress noted.

## 2017-12-23 NOTE — Interval H&P Note (Signed)
History and Physical Interval Note:  12/23/2017 7:01 AM  Peggy Chen  has presented today for surgery, with the diagnosis of LEFT URETERAL STONE  The various methods of treatment have been discussed with the patient and family. After consideration of risks, benefits and other options for treatment, the patient has consented to  Procedure(s): CYSTOSCOPY/RETROGRADE/URETEROSCOPY/HOLMIUM LASER/STENT PLACEMENT (Left) as a surgical intervention .  The patient's history has been reviewed, patient examined, no change in status, stable for surgery.  I have reviewed the patient's chart and labs.  Questions were answered to the patient's satisfaction.     Shiasia Porro,LES

## 2017-12-23 NOTE — Transfer of Care (Signed)
Immediate Anesthesia Transfer of Care Note  Patient: Peggy Chen  Procedure(s) Performed: CYSTOSCOPY/ LEFT URETEROSCOPY,STENT PLACEMENT (Left )  Patient Location: PACU  Anesthesia Type:General  Level of Consciousness: awake, alert  and oriented  Airway & Oxygen Therapy: Patient Spontanous Breathing and Patient connected to face mask oxygen  Post-op Assessment: Report given to RN and Post -op Vital signs reviewed and stable  Post vital signs: Reviewed and stable  Last Vitals:  Vitals:   12/23/17 0541  BP: 121/64  Pulse: 66  Temp: 36.9 C  SpO2: 95%    Last Pain:  Vitals:   12/23/17 0602  TempSrc:   PainSc: 0-No pain         Complications: No apparent anesthesia complications

## 2017-12-23 NOTE — Discharge Instructions (Addendum)
1. You may see some blood in the urine and may have some burning with urination for 48-72 hours. You also may notice that you have to urinate more frequently or urgently after your procedure which is normal.  °2. You should call should you develop an inability urinate, fever > 101, persistent nausea and vomiting that prevents you from eating or drinking to stay hydrated.  °

## 2017-12-23 NOTE — Op Note (Signed)
Preoperative diagnosis: Left distal ureteral calculus  Postoperative diagnosis: History of distal left ureteral calculus  Procedure:  1. Cystoscopy 2. Left ureteroscopy  Surgeon: Pryor Curia. M.D.  Anesthesia: General  Complications: None  EBL: Minimal  Disposition of specimens: [facility] for stone analysis  Indication: Peggy Chen is a 67 y.o. year old patient with urolithiasis. She recently presented with an obstructing left ureteral stone and UTI and fever.  She underwent initial stent placement and antibiotic therapy with resolution of her infection.  After reviewing the management options for treatment, the patient elected to proceed with the above surgical procedure(s). We have discussed the potential benefits and risks of the procedure, side effects of the proposed treatment, the likelihood of the patient achieving the goals of the procedure, and any potential problems that might occur during the procedure or recuperation. Informed consent has been obtained.  Description of procedure:  The patient was taken to the operating room and general anesthesia was induced.  The patient was placed in the dorsal lithotomy position, prepped and draped in the usual sterile fashion, and preoperative antibiotics were administered. A preoperative time-out was performed.   Cystourethroscopy was performed.  The patient's urethra was examined and was normal. The bladder was then systematically examined in its entirety. There was no evidence for any bladder tumors, stones, or other mucosal pathology.    Attention then turned to the left ureteral orifice and the indwelling ureteral stent was brought out the meatus with a grasper.  A 0.38 sensor guidewire was then advanced up the left ureter into the renal pelvis under fluoroscopic guidance. The 6 Fr semirigid ureteroscope was then advanced into the ureter next to the guidewire and advanced up the left ureter to the  renal pelvis.  No stone was identified indicating that she had likely passed it.  No calculus was seen on fluoroscopy in the renal collecting system.  The ureteroscope was then withdrawn.  The bladder was then emptied and the procedure ended.  The patient appeared to tolerate the procedure well and without complications.  The patient was able to be awakened and transferred to the recovery unit in satisfactory condition.

## 2017-12-27 DIAGNOSIS — Z6837 Body mass index (BMI) 37.0-37.9, adult: Secondary | ICD-10-CM | POA: Diagnosis not present

## 2017-12-27 DIAGNOSIS — I10 Essential (primary) hypertension: Secondary | ICD-10-CM | POA: Diagnosis not present

## 2017-12-27 DIAGNOSIS — A4189 Other specified sepsis: Secondary | ICD-10-CM | POA: Diagnosis not present

## 2017-12-27 DIAGNOSIS — D6489 Other specified anemias: Secondary | ICD-10-CM | POA: Diagnosis not present

## 2017-12-27 DIAGNOSIS — G9340 Encephalopathy, unspecified: Secondary | ICD-10-CM | POA: Diagnosis not present

## 2017-12-27 DIAGNOSIS — N179 Acute kidney failure, unspecified: Secondary | ICD-10-CM | POA: Diagnosis not present

## 2017-12-27 DIAGNOSIS — R600 Localized edema: Secondary | ICD-10-CM | POA: Diagnosis not present

## 2017-12-27 DIAGNOSIS — I480 Paroxysmal atrial fibrillation: Secondary | ICD-10-CM | POA: Diagnosis not present

## 2017-12-27 DIAGNOSIS — E876 Hypokalemia: Secondary | ICD-10-CM | POA: Diagnosis not present

## 2017-12-27 DIAGNOSIS — E1151 Type 2 diabetes mellitus with diabetic peripheral angiopathy without gangrene: Secondary | ICD-10-CM | POA: Diagnosis not present

## 2017-12-27 DIAGNOSIS — Z9181 History of falling: Secondary | ICD-10-CM | POA: Diagnosis not present

## 2018-01-10 DIAGNOSIS — M545 Low back pain: Secondary | ICD-10-CM | POA: Diagnosis not present

## 2018-01-10 DIAGNOSIS — M4316 Spondylolisthesis, lumbar region: Secondary | ICD-10-CM | POA: Diagnosis not present

## 2018-01-18 DIAGNOSIS — M5136 Other intervertebral disc degeneration, lumbar region: Secondary | ICD-10-CM | POA: Diagnosis not present

## 2018-01-18 DIAGNOSIS — M5134 Other intervertebral disc degeneration, thoracic region: Secondary | ICD-10-CM | POA: Diagnosis not present

## 2018-01-18 DIAGNOSIS — M9903 Segmental and somatic dysfunction of lumbar region: Secondary | ICD-10-CM | POA: Diagnosis not present

## 2018-01-18 DIAGNOSIS — N201 Calculus of ureter: Secondary | ICD-10-CM | POA: Diagnosis not present

## 2018-01-18 DIAGNOSIS — M9902 Segmental and somatic dysfunction of thoracic region: Secondary | ICD-10-CM | POA: Diagnosis not present

## 2018-02-03 IMAGING — CT CT RENAL STONE PROTOCOL
2 of 4 series · 16 of 46 positions shown, 18 images · non-contrast
Comparison: Renal ultrasound 11/24/2017.

CLINICAL DATA: Left side abdominal pain. Hydronephrosis on the left
by renal ultrasound 11/24/2017.

EXAM:
CT ABDOMEN AND PELVIS WITHOUT CONTRAST
TECHNIQUE: Multidetector CT imaging of the abdomen and pelvis was performed
following the standard protocol without IV contrast.

[Series 3: stone study 5.0 i30f 2 · axial · 0.98mm/px · z∈[-120,+315]mm · 13 of 95 slices shown, 15 images]
[im 4/95  soft-tissue]
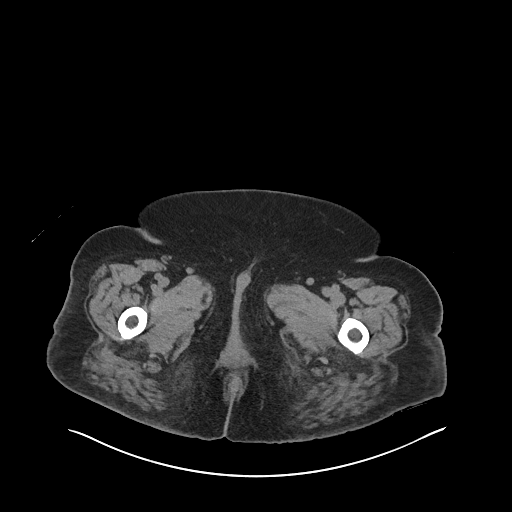
[im 4/95  bone]
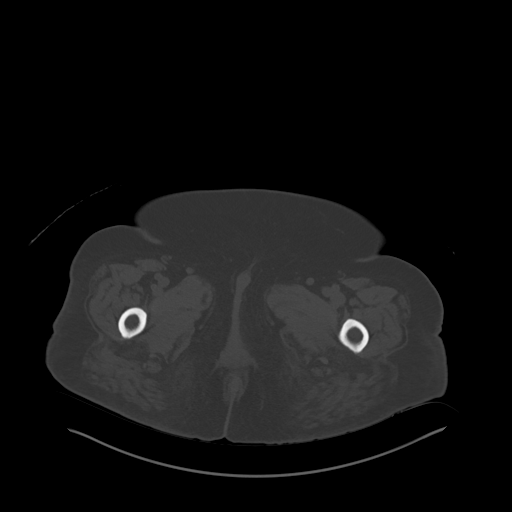
[im 12/95  soft-tissue]
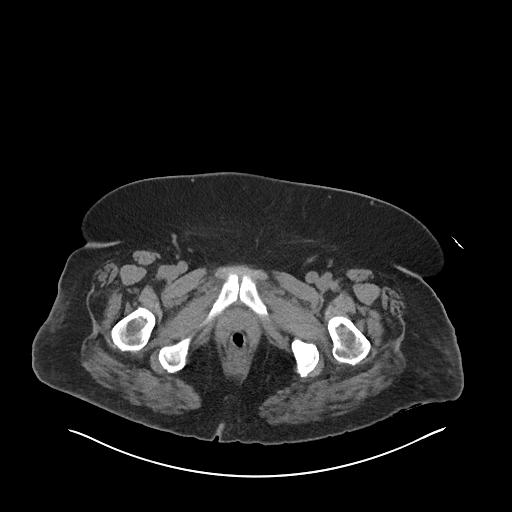
[im 20/95  soft-tissue]
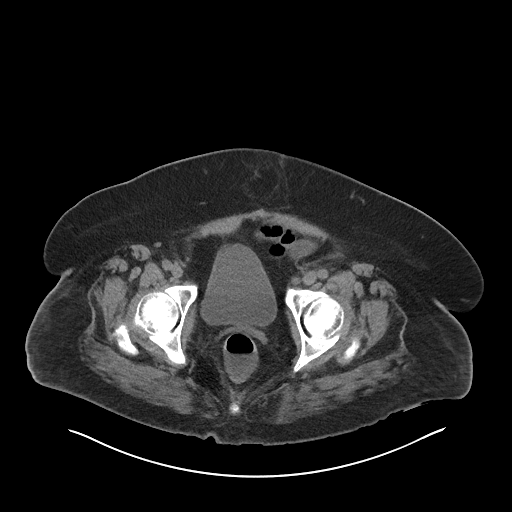
[im 28/95  soft-tissue]
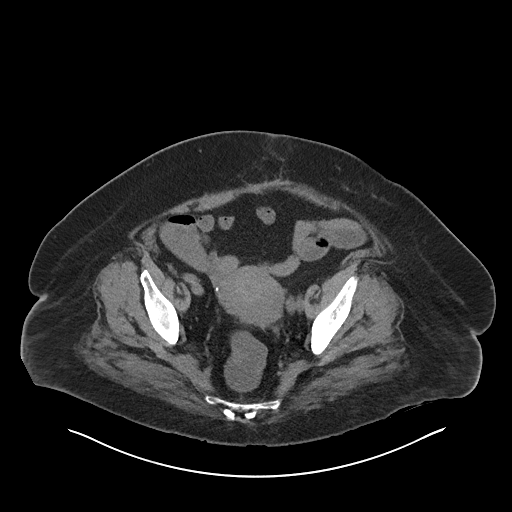
[im 32/95  soft-tissue]
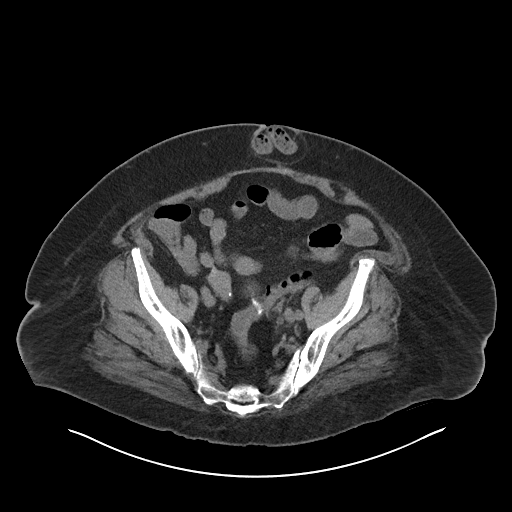
[im 40/95  soft-tissue]
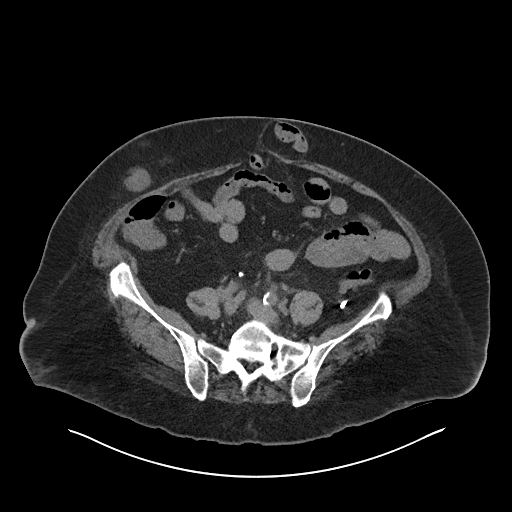
[im 48/95  soft-tissue]
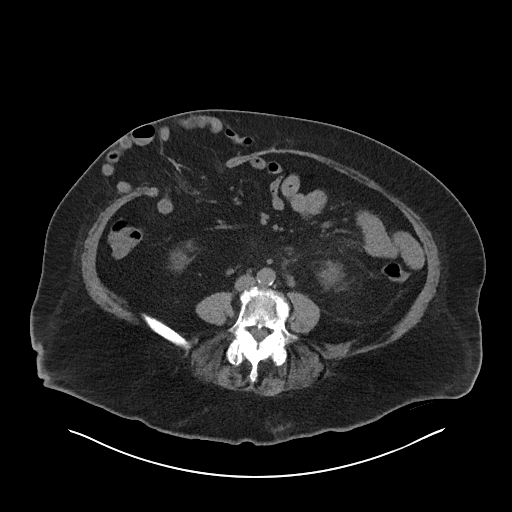
[im 55/95  soft-tissue]
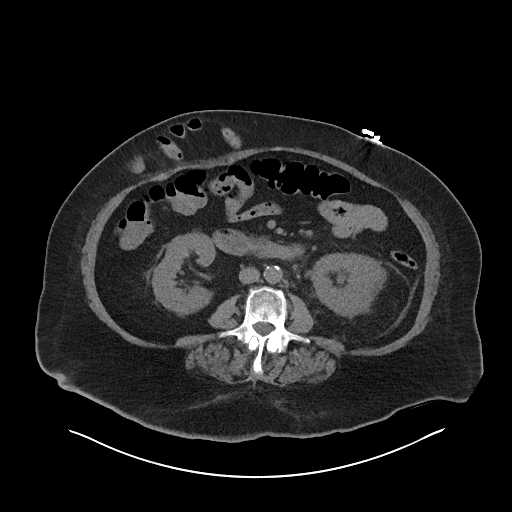
[im 63/95  soft-tissue]
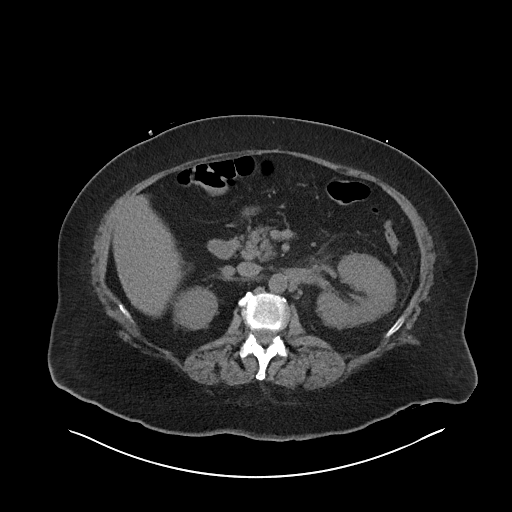
[im 63/95  bone]
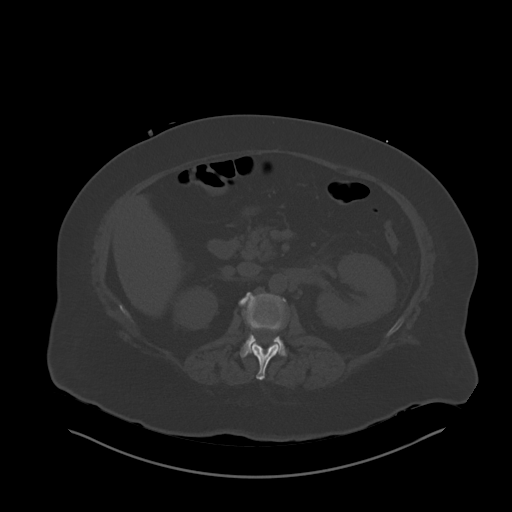
[im 67/95  soft-tissue]
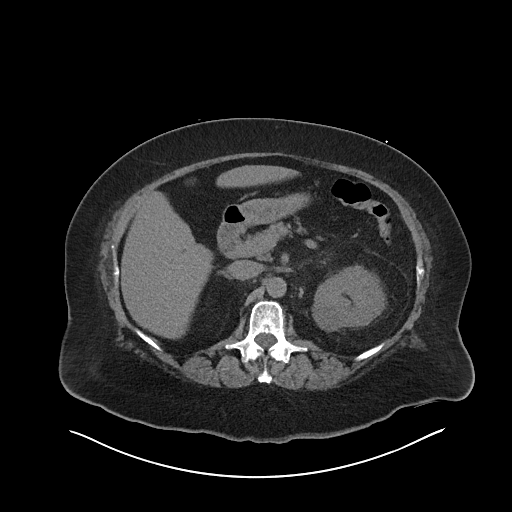
[im 75/95  soft-tissue]
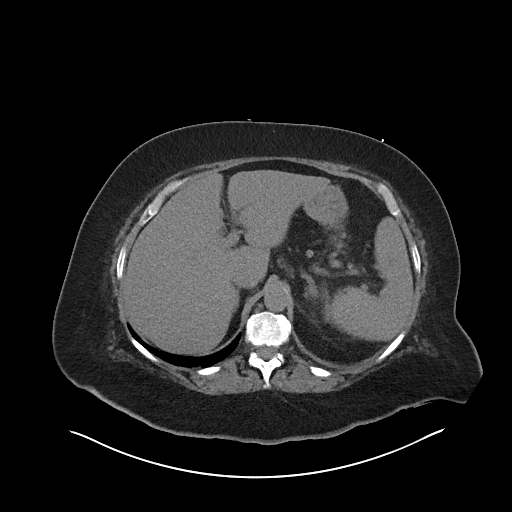
[im 83/95  soft-tissue]
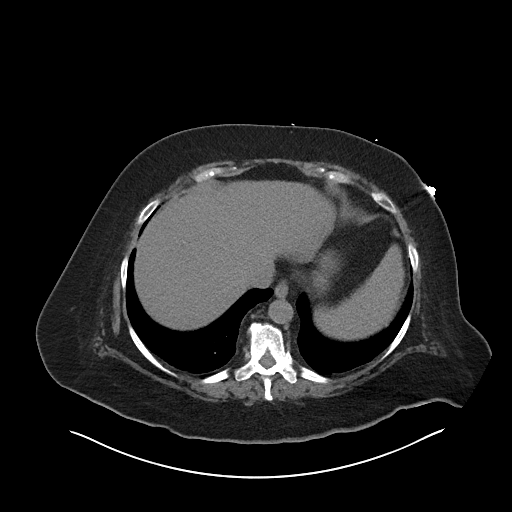
[im 91/95  soft-tissue]
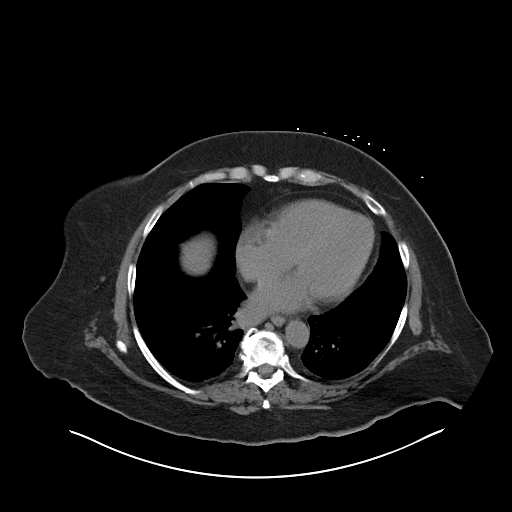

[Series 6: coronal soft tissue · coronal · 0.92mm/px · 3 of 113 slices shown]
[im 38/113  soft-tissue]
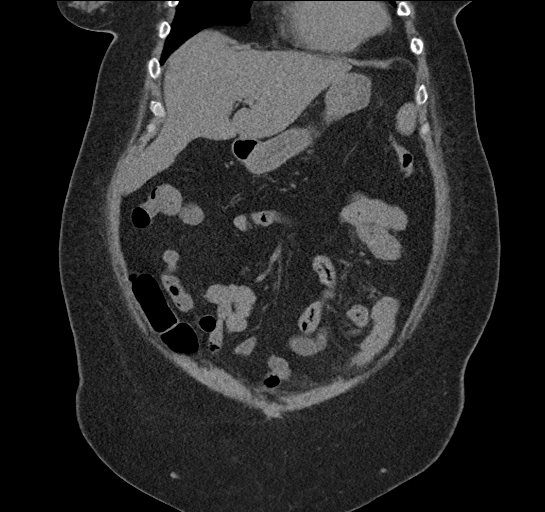
[im 50/113  soft-tissue]
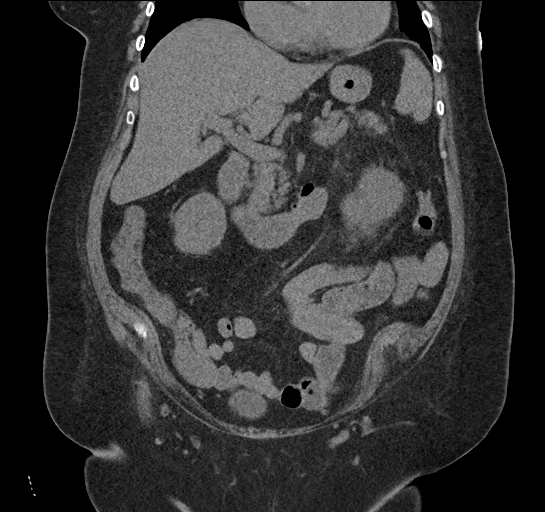
[im 63/113  soft-tissue]
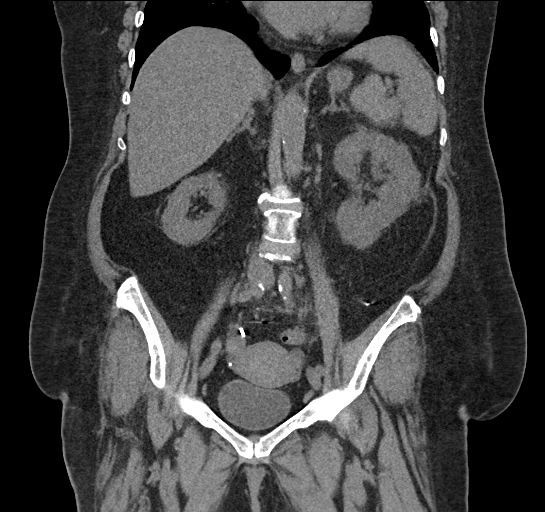

[16 of 46 positions shown; findings below may reference images not displayed]

FINDINGS: Lower chest: Mild dependent atelectasis. Lung bases otherwise clear.
No pleural or pericardial effusion.

Hepatobiliary: The liver is diffusely low attenuating consistent
with fatty infiltration. No focal lesion. The patient is status post
cholecystectomy. Biliary tree is unremarkable.

Pancreas: Unremarkable. No pancreatic ductal dilatation or
surrounding inflammatory changes.

Spleen: Normal in size without focal abnormality.

Adrenals/Urinary Tract: The left kidney appears edematous with
swelling about it. There is mild left hydronephrosis with stranding
about the left kidney and ureter. A 0.5 cm distal left ureteral
stone is identified approximately 3 cm proximal to the
ureterovesical junction. No other urinary tract stones are
identified on the right or left. No urinary bladder stones. The
adrenal glands appear normal.

Stomach/Bowel: Surgical anastomosis in the sigmoid colon is
identified. There is some scattered diverticulosis without
diverticulitis. The stomach and small bowel appear normal.

Vascular/Lymphatic: No significant vascular findings are present. No
enlarged abdominal or pelvic lymph nodes.

Reproductive: Uterus and bilateral adnexa are unremarkable.

Other: The patient has a right of midline hernia containing loops of
bowel without obstruction or other complicating feature. The hernia
defect measures 6.8 cm transverse by by 4.3 cm craniocaudal. 2
midline ventral hernias are seen in the pelvis. The more superior is
larger where a defect measuring approximately 6 cm craniocaudal by
2.3 cm transverse is present. There are loops of bowel within this
morning without obstruction or other complicating feature. A smaller
fat containing ventral hernia is seen more inferiorly.

Musculoskeletal: No fracture or worrisome lesion. Lower lumbar
spondylosis is noted
IMPRESSION: Mild left hydronephrosis with stranding about the left kidney and
ureter due to a 0.5 cm distal left ureteral stone. The left kidney
appears edematous compared to the right.

Three abdominal wall hernias are identified. Two of these contain
loops of bowel without obstruction other complicating feature.

Fatty infiltration of the liver.

Mild diverticulosis without diverticulitis.

Atherosclerosis.

## 2018-02-28 DIAGNOSIS — E669 Obesity, unspecified: Secondary | ICD-10-CM | POA: Diagnosis not present

## 2018-02-28 DIAGNOSIS — I10 Essential (primary) hypertension: Secondary | ICD-10-CM | POA: Diagnosis not present

## 2018-02-28 DIAGNOSIS — E119 Type 2 diabetes mellitus without complications: Secondary | ICD-10-CM | POA: Diagnosis not present

## 2018-02-28 DIAGNOSIS — Z7901 Long term (current) use of anticoagulants: Secondary | ICD-10-CM | POA: Diagnosis not present

## 2018-02-28 DIAGNOSIS — N184 Chronic kidney disease, stage 4 (severe): Secondary | ICD-10-CM | POA: Diagnosis not present

## 2018-02-28 DIAGNOSIS — Z6834 Body mass index (BMI) 34.0-34.9, adult: Secondary | ICD-10-CM | POA: Diagnosis not present

## 2018-02-28 DIAGNOSIS — Z1389 Encounter for screening for other disorder: Secondary | ICD-10-CM | POA: Diagnosis not present

## 2018-02-28 DIAGNOSIS — E1151 Type 2 diabetes mellitus with diabetic peripheral angiopathy without gangrene: Secondary | ICD-10-CM | POA: Diagnosis not present

## 2018-02-28 DIAGNOSIS — N200 Calculus of kidney: Secondary | ICD-10-CM | POA: Diagnosis not present

## 2018-02-28 DIAGNOSIS — Z960 Presence of urogenital implants: Secondary | ICD-10-CM | POA: Diagnosis not present

## 2018-04-04 DIAGNOSIS — Z1231 Encounter for screening mammogram for malignant neoplasm of breast: Secondary | ICD-10-CM | POA: Diagnosis not present

## 2018-04-04 DIAGNOSIS — Z7901 Long term (current) use of anticoagulants: Secondary | ICD-10-CM | POA: Diagnosis not present

## 2018-04-04 DIAGNOSIS — I480 Paroxysmal atrial fibrillation: Secondary | ICD-10-CM | POA: Diagnosis not present

## 2018-07-11 DIAGNOSIS — I10 Essential (primary) hypertension: Secondary | ICD-10-CM | POA: Diagnosis not present

## 2018-07-11 DIAGNOSIS — I2510 Atherosclerotic heart disease of native coronary artery without angina pectoris: Secondary | ICD-10-CM | POA: Diagnosis not present

## 2018-07-11 DIAGNOSIS — Z7901 Long term (current) use of anticoagulants: Secondary | ICD-10-CM | POA: Diagnosis not present

## 2018-07-11 DIAGNOSIS — N183 Chronic kidney disease, stage 3 (moderate): Secondary | ICD-10-CM | POA: Diagnosis not present

## 2018-07-11 DIAGNOSIS — Z6834 Body mass index (BMI) 34.0-34.9, adult: Secondary | ICD-10-CM | POA: Diagnosis not present

## 2018-07-11 DIAGNOSIS — E1151 Type 2 diabetes mellitus with diabetic peripheral angiopathy without gangrene: Secondary | ICD-10-CM | POA: Diagnosis not present

## 2018-07-11 DIAGNOSIS — E668 Other obesity: Secondary | ICD-10-CM | POA: Diagnosis not present

## 2018-07-11 DIAGNOSIS — I480 Paroxysmal atrial fibrillation: Secondary | ICD-10-CM | POA: Diagnosis not present

## 2018-07-11 DIAGNOSIS — E7849 Other hyperlipidemia: Secondary | ICD-10-CM | POA: Diagnosis not present

## 2018-07-13 DIAGNOSIS — H2513 Age-related nuclear cataract, bilateral: Secondary | ICD-10-CM | POA: Diagnosis not present

## 2018-07-13 DIAGNOSIS — E119 Type 2 diabetes mellitus without complications: Secondary | ICD-10-CM | POA: Diagnosis not present

## 2018-07-13 DIAGNOSIS — H40013 Open angle with borderline findings, low risk, bilateral: Secondary | ICD-10-CM | POA: Diagnosis not present

## 2018-07-13 DIAGNOSIS — H25013 Cortical age-related cataract, bilateral: Secondary | ICD-10-CM | POA: Diagnosis not present

## 2018-08-09 ENCOUNTER — Encounter (HOSPITAL_COMMUNITY): Payer: Self-pay

## 2018-08-09 ENCOUNTER — Emergency Department (HOSPITAL_COMMUNITY)
Admission: EM | Admit: 2018-08-09 | Discharge: 2018-08-09 | Disposition: A | Discharge status: Home / Self Care | Payer: Medicare Other | Attending: Emergency Medicine | Admitting: Emergency Medicine

## 2018-08-09 ENCOUNTER — Other Ambulatory Visit: Payer: Self-pay

## 2018-08-09 ENCOUNTER — Emergency Department (HOSPITAL_COMMUNITY): Payer: Medicare Other

## 2018-08-09 DIAGNOSIS — Y92008 Other place in unspecified non-institutional (private) residence as the place of occurrence of the external cause: Secondary | ICD-10-CM | POA: Diagnosis not present

## 2018-08-09 DIAGNOSIS — S51812A Laceration without foreign body of left forearm, initial encounter: Secondary | ICD-10-CM | POA: Insufficient documentation

## 2018-08-09 DIAGNOSIS — Y9301 Activity, walking, marching and hiking: Secondary | ICD-10-CM | POA: Diagnosis not present

## 2018-08-09 DIAGNOSIS — Z85828 Personal history of other malignant neoplasm of skin: Secondary | ICD-10-CM | POA: Insufficient documentation

## 2018-08-09 DIAGNOSIS — Z87891 Personal history of nicotine dependence: Secondary | ICD-10-CM | POA: Insufficient documentation

## 2018-08-09 DIAGNOSIS — Z79899 Other long term (current) drug therapy: Secondary | ICD-10-CM | POA: Insufficient documentation

## 2018-08-09 DIAGNOSIS — S0990XA Unspecified injury of head, initial encounter: Secondary | ICD-10-CM | POA: Diagnosis not present

## 2018-08-09 DIAGNOSIS — W108XXA Fall (on) (from) other stairs and steps, initial encounter: Secondary | ICD-10-CM | POA: Diagnosis not present

## 2018-08-09 DIAGNOSIS — R220 Localized swelling, mass and lump, head: Secondary | ICD-10-CM | POA: Diagnosis not present

## 2018-08-09 DIAGNOSIS — J45909 Unspecified asthma, uncomplicated: Secondary | ICD-10-CM | POA: Insufficient documentation

## 2018-08-09 DIAGNOSIS — S0181XA Laceration without foreign body of other part of head, initial encounter: Secondary | ICD-10-CM | POA: Diagnosis not present

## 2018-08-09 DIAGNOSIS — Y999 Unspecified external cause status: Secondary | ICD-10-CM | POA: Diagnosis not present

## 2018-08-09 DIAGNOSIS — S0012XA Contusion of left eyelid and periocular area, initial encounter: Secondary | ICD-10-CM

## 2018-08-09 DIAGNOSIS — Z7982 Long term (current) use of aspirin: Secondary | ICD-10-CM | POA: Diagnosis not present

## 2018-08-09 DIAGNOSIS — Z7984 Long term (current) use of oral hypoglycemic drugs: Secondary | ICD-10-CM | POA: Insufficient documentation

## 2018-08-09 DIAGNOSIS — E119 Type 2 diabetes mellitus without complications: Secondary | ICD-10-CM | POA: Insufficient documentation

## 2018-08-09 DIAGNOSIS — Y92009 Unspecified place in unspecified non-institutional (private) residence as the place of occurrence of the external cause: Secondary | ICD-10-CM

## 2018-08-09 DIAGNOSIS — I10 Essential (primary) hypertension: Secondary | ICD-10-CM | POA: Insufficient documentation

## 2018-08-09 DIAGNOSIS — W19XXXA Unspecified fall, initial encounter: Secondary | ICD-10-CM

## 2018-08-09 NOTE — ED Triage Notes (Signed)
Pt here from home for a fall at home.  Hit her head and left arm.  Pt called PCP and told her to come to the ED do to being on coumadin. A&Ox4.

## 2018-08-09 NOTE — ED Triage Notes (Signed)
Pt to CT via WC

## 2018-08-09 NOTE — ED Provider Notes (Signed)
[facility] EMERGENCY DEPARTMENT Provider Note   CSN: [id number] Arrival date & time: 08/09/18  1653     History   Chief Complaint Chief Complaint  Patient presents with  . Fall    HPI Peggy Chen is a 67 y.o. female.  Patient states she lunged for her cat, and fell down several steps from the porch of her house. She landed on her knees and "face-planted" on the sidewalk. She struck the left side of her face and head. She did not lose consciousness.  She has bruising to the lateral aspect of her left orbit, a contusion to her left lower lip without  tooth damage, superficial lacerations to her knees, and a small skin tear to her left forearm. She is on coumadin.  The history is provided by the patient. No language interpreter was used.  Fall  This is a new problem. The current episode started 3 to 5 hours ago. The problem has not changed since onset.Pertinent negatives include no headaches.    Past Medical History:  Diagnosis Date  . AF (atrial fibrillation) ([place])   . Anxiety   . Arthritis   . Asthma    Bronchitis  . Atherosclerosis 11/25/2017   noted on CT  . Back pain   . Cancer [facility])    questionable skin cancer on face years ago  . Depression   . Diabetes mellitus   . Diverticulosis 11/25/2017   mild, noted on CT  . Dyspnea    Afib  . Fatty liver 11/25/2017   noted on CT  . History of kidney stones   . Hydronephrosis 11/25/2017   mild left noted on CT  . Hyperlipidemia   . Hypertension   . Renal cyst 11/2017   Left, noted on [place]  . UTI (urinary tract infection)     Patient Active Problem List   Diagnosis Date Noted  . Hypokalemia 11/25/2017  . Falls 11/25/2017  . Diabetes mellitus type 2, uncomplicated ([place]) 40/98/1191  . Chronic anticoagulation 11/25/2017  . Sepsis secondary to UTI ([place]) 11/24/2017  . Hyperlipidemia 06/17/2015  . HTN (hypertension) 09/28/2011  . Paroxysmal atrial fibrillation ([place]) 06/29/2011    Past Surgical  History:  Procedure Laterality Date  . BREAST CYST REMOVAL     RIGHT BREAST  . CARDIOVERSION N/A 04/17/2014   Procedure: CARDIOVERSION;  Surgeon: Thayer Headings, MD;  Location: [facility];  Service: Cardiovascular;  Laterality: N/A;  . CARDIOVERSION  05/2014   attempted DC failed  . COLON SURGERY     May 2000  . COLONOSCOPY    . CYSTOSCOPY/URETEROSCOPY/HOLMIUM LASER/STENT PLACEMENT Left 11/26/2017   Procedure: CYSTOSCOPY/STENT PLACEMENT;  Surgeon: Raynelle Bring, MD;  Location: [facility];  Service: Urology;  Laterality: Left;  . CYSTOSCOPY/URETEROSCOPY/HOLMIUM LASER/STENT PLACEMENT Left 12/23/2017   Procedure: CYSTOSCOPY/ LEFT URETEROSCOPY;  Surgeon: Raynelle Bring, MD;  Location[facility];  Service: Urology;  Laterality: Left;  . TONSILLECTOMY    . US ECHOCARDIOGRAPHY  12/30/2009   EF 55-60%     OB History   None      Home Medications    Prior to Admission medications   Medication Sig Start Date End Date Taking? Authorizing Provider  allopurinol (ZYLOPRIM) 100 MG tablet Take 100 mg by mouth See admin instructions. 100 mg once a day for 1-2 weeks after gout flares 10/05/17   [provider]  amLODipine (NORVASC) 5 MG tablet Take 5 mg by mouth daily. 08/11/16   [provider]  aspirin 81  MG tablet Take 81 mg by mouth daily.  01/05/11   [provider]  citalopram (CELEXA) 10 MG tablet Take 10 mg by mouth daily.    [provider]  COLCRYS 0.6 MG tablet Take 0.6 mg by mouth daily as needed (for gout flares).  10/05/17   [provider]  cyanocobalamin 1000 MCG tablet Take 1,000 mcg by mouth daily.     [provider]  flecainide (TAMBOCOR) 150 MG tablet Take 1 tablet (150 mg total) by mouth 2 (two) times daily. 06/17/15   Nahser, Wonda Cheng, MD  HYDROcodone-acetaminophen (NORCO/VICODIN) 5-325 MG per tablet Take 1 tablet by mouth every 6 (six) hours as needed for moderate pain.    [provider]  HYDROcodone-acetaminophen  (NORCO/VICODIN) 5-325 MG tablet Take 1-2 tablets by mouth every 6 (six) hours as needed. 12/23/17   Raynelle Bring, MD  Magnesium Oxide 400 MG CAPS Take 1 capsule (400 mg total) by mouth daily. 11/28/17   Debbe Odea, MD  metoprolol (TOPROL-XL) 200 MG 24 hr tablet TAKE ONE TABLET BY MOUTH ONCE DAILY Patient taking differently: Take 200 mg by mouth once a day 11/09/17   Nahser, Wonda Cheng, MD  potassium chloride (K-DUR) 10 MEQ tablet Take 2 tablets (20 mEq total) by mouth daily. 11/28/17   Debbe Odea, MD  sitaGLIPtin (JANUVIA) 50 MG tablet Take 1 tablet (50 mg total) by mouth daily. 11/28/17   Debbe Odea, MD  sitaGLIPtin-metformin (JANUMET) 50-1000 MG tablet Take 1 tablet by mouth 2 (two) times daily with a meal.    [provider]  warfarin (COUMADIN) 5 MG tablet Take 5-7.5 mg by mouth See admin instructions. 5 mg at bedtime on Sun/Mon/Tues/Thurs/Fri/Sat and 7.5 mg on Wed    [provider]  zolpidem (AMBIEN) 10 MG tablet Take 10 mg by mouth at bedtime as needed for sleep.    [provider]    Family History Family History  Problem Relation Age of Onset  . COPD Mother   . Heart attack Father     Social History Social History   Tobacco Use  . Smoking status: Former Smoker    Last attempt to quit: 06/22/1986    Years since quitting: 32.1  . Smokeless tobacco: Never Used  Substance Use Topics  . Alcohol use: Yes    Comment: occasional  . Drug use: No     Allergies   Penicillins   Review of Systems Review of Systems  Eyes: Negative for visual disturbance.  Neurological: Negative for dizziness, weakness and headaches.  Hematological: Bruises/bleeds easily.  All other systems reviewed and are negative.    Physical Exam Updated Vital Signs BP (!) 129/51 (BP Location: Right Arm)   Pulse 65   Temp 98.6 F (37 C) (Oral)   Resp 18   SpO2 96%   Physical Exam  Constitutional: She is oriented to person, place, and time. She appears  well-developed and well-nourished. No distress.  HENT:  Bruising to lateral aspect of left orbit. Contusion to left lower lip.  Eyes: Pupils are equal, round, and reactive to light. EOM are normal.  Neck: Neck supple.  Cardiovascular: Normal rate and regular rhythm.  Pulmonary/Chest: Effort normal and breath sounds normal.  Abdominal: Soft. Bowel sounds are normal.  Musculoskeletal: Normal range of motion. She exhibits tenderness. She exhibits no deformity.  Superficial skin tear left forearm. Superficial abrasion right knee.  Neurological: She is alert and oriented to person, place, and time. She has normal strength. No cranial  nerve deficit or sensory deficit. Coordination normal. GCS eye subscore is 4. GCS verbal subscore is 5. GCS motor subscore is 6.  Skin: Skin is warm and dry.  Psychiatric: She has a normal mood and affect.  Nursing note and vitals reviewed.    ED Treatments / Results  Labs (all labs ordered are listed, but only abnormal results are displayed) Labs Reviewed - No data to display  EKG None  Radiology Ct Head Wo Contrast  Result Date: 08/09/2018 CLINICAL DATA:  67 year old female with history of trauma from a fall with laceration overlying the left side of the maxilla and supraorbital region. EXAM: CT HEAD WITHOUT CONTRAST CT MAXILLOFACIAL WITHOUT CONTRAST TECHNIQUE: Multidetector CT imaging of the head and maxillofacial structures were performed using the standard protocol without intravenous contrast. Multiplanar CT image reconstructions of the maxillofacial structures were also generated. COMPARISON:  Head CT 11/24/2017.  Cervical spine CT 11/24/2017. FINDINGS: CT HEAD FINDINGS Brain: Patchy and confluent areas of decreased attenuation are noted throughout the deep and periventricular white matter of the cerebral hemispheres bilaterally, compatible with chronic microvascular ischemic disease. Small old lacunar infarct in the head of the left caudate nucleus. No  evidence of acute infarction, hemorrhage, hydrocephalus, extra-axial collection or mass lesion/mass effect. Vascular: No hyperdense vessel or unexpected calcification. Skull: Normal. Negative for fracture or focal lesion. Other: None. CT MAXILLOFACIAL FINDINGS Osseous: No fracture or mandibular dislocation. No destructive process. Orbits: Negative. No traumatic or inflammatory finding. Sinuses: Clear. Soft tissues: Small amount of soft tissue swelling superficial to the left maxilla and lateral to the left orbit. IMPRESSION: 1. Small amount of soft tissue swelling anterior to the left maxilla and lateral to the left orbit. No underlying displaced facial bone fractures, skull fractures or evidence of acute intracranial trauma. 2. Chronic microvascular ischemic changes in the cerebral white matter and small old lacunar infarct in the head of the left caudate nucleus. Electronically Signed   By: Vinnie Langton M.D.   On: 08/09/2018 19:30   Ct Maxillofacial Wo Contrast  Result Date: 08/09/2018 CLINICAL DATA:  67 year old female with history of trauma from a fall with laceration overlying the left side of the maxilla and supraorbital region. EXAM: CT HEAD WITHOUT CONTRAST CT MAXILLOFACIAL WITHOUT CONTRAST TECHNIQUE: Multidetector CT imaging of the head and maxillofacial structures were performed using the standard protocol without intravenous contrast. Multiplanar CT image reconstructions of the maxillofacial structures were also generated. COMPARISON:  Head CT 11/24/2017.  Cervical spine CT 11/24/2017. FINDINGS: CT HEAD FINDINGS Brain: Patchy and confluent areas of decreased attenuation are noted throughout the deep and periventricular white matter of the cerebral hemispheres bilaterally, compatible with chronic microvascular ischemic disease. Small old lacunar infarct in the head of the left caudate nucleus. No evidence of acute infarction, hemorrhage, hydrocephalus, extra-axial collection or mass lesion/mass  effect. Vascular: No hyperdense vessel or unexpected calcification. Skull: Normal. Negative for fracture or focal lesion. Other: None. CT MAXILLOFACIAL FINDINGS Osseous: No fracture or mandibular dislocation. No destructive process. Orbits: Negative. No traumatic or inflammatory finding. Sinuses: Clear. Soft tissues: Small amount of soft tissue swelling superficial to the left maxilla and lateral to the left orbit. IMPRESSION: 1. Small amount of soft tissue swelling anterior to the left maxilla and lateral to the left orbit. No underlying displaced facial bone fractures, skull fractures or evidence of acute intracranial trauma. 2. Chronic microvascular ischemic changes in the cerebral white matter and small old lacunar infarct in the head of the left caudate nucleus. Electronically Signed  By: Vinnie Langton M.D.   On: 08/09/2018 19:30    Procedures Procedures (including critical care time)  Medications Ordered in ED Medications - No data to display   Initial Impression / Assessment and Plan / ED Course  I have reviewed the triage vital signs and the nursing notes.  Pertinent labs & imaging results that were available during my care of the patient were reviewed by me and considered in my medical decision making (see chart for details).      Patient discussed with Dr. Tamera Punt.  Patient with fall at home resulting in contusion of face and head. Patient is on coumadin. No loss of consciousness. No vomiting. No focal neurological deficits on physical exam.  Pt observed in the ED. CT results reviewed and shared with patient, no acute findings Discussed reasons to return to the emergency department including any new  severe headaches, disequilibrium, vomiting, double vision, extremity weakness, difficulty ambulating, or any other concerning symptoms. Patient will be discharged with information pertaining to diagnosis.  Pt is safe for discharge at this time.  Final Clinical Impressions(s) / ED  Diagnoses   Final diagnoses:  Fall in home, initial encounter  Contusion of left periocular region, initial encounter  Skin tear of left forearm without complication, initial encounter    ED Discharge Orders    None       Etta Quill, NP 08/09/18 2129    Malvin Johns, MD 08/09/18 2302

## 2018-08-09 NOTE — Discharge Instructions (Signed)
Please check with your primary care provider to see if your tetanus should be updated.

## 2018-08-09 NOTE — ED Notes (Signed)
CT called for disc of pt's CT

## 2018-08-24 DIAGNOSIS — Z7901 Long term (current) use of anticoagulants: Secondary | ICD-10-CM | POA: Diagnosis not present

## 2018-08-24 DIAGNOSIS — Z23 Encounter for immunization: Secondary | ICD-10-CM | POA: Diagnosis not present

## 2018-08-24 DIAGNOSIS — I480 Paroxysmal atrial fibrillation: Secondary | ICD-10-CM | POA: Diagnosis not present

## 2018-09-01 ENCOUNTER — Other Ambulatory Visit: Payer: Self-pay | Admitting: Internal Medicine

## 2018-10-17 ENCOUNTER — Encounter: Payer: Self-pay | Admitting: Cardiovascular Disease

## 2018-10-17 DIAGNOSIS — E7849 Other hyperlipidemia: Secondary | ICD-10-CM | POA: Diagnosis not present

## 2018-10-17 DIAGNOSIS — R82998 Other abnormal findings in urine: Secondary | ICD-10-CM | POA: Diagnosis not present

## 2018-10-17 DIAGNOSIS — E1151 Type 2 diabetes mellitus with diabetic peripheral angiopathy without gangrene: Secondary | ICD-10-CM | POA: Diagnosis not present

## 2018-10-17 DIAGNOSIS — I10 Essential (primary) hypertension: Secondary | ICD-10-CM | POA: Diagnosis not present

## 2018-10-19 DIAGNOSIS — E668 Other obesity: Secondary | ICD-10-CM | POA: Diagnosis not present

## 2018-10-19 DIAGNOSIS — K529 Noninfective gastroenteritis and colitis, unspecified: Secondary | ICD-10-CM | POA: Diagnosis not present

## 2018-10-19 DIAGNOSIS — Z7901 Long term (current) use of anticoagulants: Secondary | ICD-10-CM | POA: Diagnosis not present

## 2018-10-19 DIAGNOSIS — Z1389 Encounter for screening for other disorder: Secondary | ICD-10-CM | POA: Diagnosis not present

## 2018-10-19 DIAGNOSIS — I2510 Atherosclerotic heart disease of native coronary artery without angina pectoris: Secondary | ICD-10-CM | POA: Diagnosis not present

## 2018-10-19 DIAGNOSIS — Z0000 Encounter for general adult medical examination without abnormal findings: Secondary | ICD-10-CM | POA: Diagnosis not present

## 2018-10-19 DIAGNOSIS — I480 Paroxysmal atrial fibrillation: Secondary | ICD-10-CM | POA: Diagnosis not present

## 2018-10-19 DIAGNOSIS — Z6834 Body mass index (BMI) 34.0-34.9, adult: Secondary | ICD-10-CM | POA: Diagnosis not present

## 2018-10-19 DIAGNOSIS — E7849 Other hyperlipidemia: Secondary | ICD-10-CM | POA: Diagnosis not present

## 2018-10-19 DIAGNOSIS — I10 Essential (primary) hypertension: Secondary | ICD-10-CM | POA: Diagnosis not present

## 2018-10-19 DIAGNOSIS — N183 Chronic kidney disease, stage 3 (moderate): Secondary | ICD-10-CM | POA: Diagnosis not present

## 2018-10-19 DIAGNOSIS — E1151 Type 2 diabetes mellitus with diabetic peripheral angiopathy without gangrene: Secondary | ICD-10-CM | POA: Diagnosis not present

## 2018-11-07 ENCOUNTER — Encounter: Payer: Self-pay | Admitting: Cardiovascular Disease

## 2018-11-07 ENCOUNTER — Ambulatory Visit (INDEPENDENT_AMBULATORY_CARE_PROVIDER_SITE_OTHER): Payer: Medicare Other | Admitting: Cardiovascular Disease

## 2018-11-07 VITALS — BP 134/74 | HR 69 | Ht 65.0 in | Wt 209.0 lb

## 2018-11-07 DIAGNOSIS — E876 Hypokalemia: Secondary | ICD-10-CM | POA: Diagnosis not present

## 2018-11-07 DIAGNOSIS — I10 Essential (primary) hypertension: Secondary | ICD-10-CM | POA: Diagnosis not present

## 2018-11-07 DIAGNOSIS — I480 Paroxysmal atrial fibrillation: Secondary | ICD-10-CM | POA: Diagnosis not present

## 2018-11-07 MED ORDER — POTASSIUM CHLORIDE ER 10 MEQ PO TBCR: 10.0000 meq | EXTENDED_RELEASE_TABLET | Freq: Two times a day (BID) | ORAL | 11 refills | Status: DC

## 2018-11-07 NOTE — Progress Notes (Signed)
Peggy Chen Date of Birth  Jul 23, 1951       [facility] [address], [address], [place], [place]  [address]   [place], [place]  [address] [phone]     [phone]   Fax  [phone]    Fax [phone]  Problem List: 1. Intermittent atrial fibrillation 2. Diabetes mellitus 3. Hyperlipidemia 4. Hypertension     67 year old female with a history of intermittent atrial fibrillation. She also has a history of diabetes mellitus, hyperlipidemia, and hypertension. She still occasionally has episodes of A-Fib that last 1-3 days.  They can be as short as 3 hours.  She takes propranolol which seems to help.  She is on coumadin.   She does not exercise much at all.  She actually feels better when she is active.    February 26, 2014:  Still having some atrial fib.  On coumadin.    May 16, 2014: We attempted cardioversion [place] on Apr 17, 2014.  It was not successful. Since that time, we have started her on flecainide. [name] converted to sinus rhythm with the flecainide.  Sept. 19, 2017:  Has had many life changes Lost her husband Dec. 2015 Daughter is in jail for heroin   She is maintaining NSR Is on flecainide.  Has occasional afib episodes if she misses a dose.  November 07, 2018: Seen back today after 2-year absence. She was seen by Robbie Lis last year .    She has had some ortho surgery since I last saw her   Has been falling  Has fallen 3 times over the past year.  Mostly due lack of balance  Ive suggested she discuss with her primary MD - may need a neuro consult  Has lost lots of strength in her legs.    Current Outpatient Medications on File Prior to Visit  Medication Sig Dispense Refill  . allopurinol (ZYLOPRIM) 100 MG tablet Take 100 mg by mouth See admin instructions. 100 mg once a day for 1-2 weeks after gout flares    . amLODipine (NORVASC) 5 MG tablet Take 5 mg by mouth daily.    Marland Kitchen  aspirin 81 MG tablet Take 81 mg by mouth daily.     . citalopram (CELEXA) 10 MG tablet Take 10 mg by mouth daily.    Marland Kitchen COLCRYS 0.6 MG tablet Take 0.6 mg by mouth daily as needed (for gout flares).     . cyanocobalamin 1000 MCG tablet Take 1,000 mcg by mouth daily.     . flecainide (TAMBOCOR) 150 MG tablet Take 150 mg by mouth 2 (two) times daily.    Marland Kitchen HYDROcodone-acetaminophen (NORCO/VICODIN) 5-325 MG tablet Take 1-2 tablets by mouth every 6 (six) hours as needed. 10 tablet 0  . Magnesium Oxide 400 MG CAPS Take 1 capsule (400 mg total) by mouth daily. 30 capsule 0  . metoprolol (TOPROL-XL) 200 MG 24 hr tablet TAKE ONE TABLET BY MOUTH ONCE DAILY (Patient taking differently: Take 200 mg by mouth once a day) 90 tablet 3  . sitaGLIPtin-metformin (JANUMET) 50-1000 MG tablet Take 1 tablet by mouth 2 (two) times daily with a meal.    . warfarin (COUMADIN) 5 MG tablet Take 5-7.5 mg by mouth See admin instructions. 5 mg at bedtime on Sun/Mon/Tues/Thurs/Fri/Sat and 7.5 mg on Wed    . zolpidem (AMBIEN) 10 MG tablet Take 10 mg by mouth at bedtime as needed for sleep.  No current facility-administered medications on file prior to visit.     Allergies  Allergen Reactions  . Penicillins Hives    Has patient had a PCN reaction causing immediate rash, facial/tongue/throat swelling, SOB or lightheadedness with hypotension: Hives Has patient had a PCN reaction causing severe rash involving mucus membranes or skin necrosis: Unk Has patient had a PCN reaction that required hospitalization: No Has patient had a PCN reaction occurring within the last 10 years: No If all of the above answers are "NO", then may proceed with Cephalosporin use.     Past Medical History:  Diagnosis Date  . AF (atrial fibrillation) ([place])   . Anxiety   . Arthritis   . Asthma    Bronchitis  . Atherosclerosis 11/25/2017   noted on CT  . Back pain   . Cancer [facility])    questionable skin cancer on face years ago  . Depression     . Diabetes mellitus   . Diverticulosis 11/25/2017   mild, noted on CT  . Dyspnea    Afib  . Fatty liver 11/25/2017   noted on CT  . History of kidney stones   . Hydronephrosis 11/25/2017   mild left noted on CT  . Hyperlipidemia   . Hypertension   . Renal cyst 11/2017   Left, noted on [place]  . UTI (urinary tract infection)     Past Surgical History:  Procedure Laterality Date  . BREAST CYST REMOVAL     RIGHT BREAST  . CARDIOVERSION N/A 04/17/2014   Procedure: CARDIOVERSION;  Surgeon: Thayer Headings, MD;  Location: [facility];  Service: Cardiovascular;  Laterality: N/A;  . CARDIOVERSION  05/2014   attempted DC failed  . COLON SURGERY     May 2000  . COLONOSCOPY    . CYSTOSCOPY/URETEROSCOPY/HOLMIUM LASER/STENT PLACEMENT Left 11/26/2017   Procedure: CYSTOSCOPY/STENT PLACEMENT;  Surgeon: Raynelle Bring, MD;  [facility];  Service: Urology;  Laterality: Left;  . CYSTOSCOPY/URETEROSCOPY/HOLMIUM LASER/STENT PLACEMENT Left 12/23/2017   Procedure: CYSTOSCOPY/ LEFT URETEROSCOPY;  Surgeon: Raynelle Bring, MD;  [facility];  Service: Urology;  Laterality: Left;  . TONSILLECTOMY    . US ECHOCARDIOGRAPHY  12/30/2009   EF 55-60%    Social History   Tobacco Use  Smoking Status Former Smoker  . Last attempt to quit: 06/22/1986  . Years since quitting: 32.4  Smokeless Tobacco Never Used    Social History   Substance and Sexual Activity  Alcohol Use Yes   Comment: occasional    Family History  Problem Relation Age of Onset  . COPD Mother   . Heart attack Father     Reviw of Systems:  Reviewed in the HPI.  All other systems are negative.  Physical Exam: Blood pressure 134/74, pulse 69, height 5\' 5"  (1.651 m), weight 209 lb (94.8 kg), SpO2 97 %.  GEN:  Well nourished, well developed in no acute distress HEENT: Normal NECK: No JVD; No carotid bruits LYMPHATICS: No lymphadenopathy CARDIAC: RRR  RESPIRATORY:  Clear to auscultation without rales, wheezing or  rhonchi  ABDOMEN: Soft, non-tender, non-distended MUSCULOSKELETAL:  No edema; No deformity ,  Foot pulses are great  SKIN: Warm and dry NEUROLOGIC:  Alert and oriented x 3   ECG: November 07, 2018: Normal sinus rhythm at 69 beats minute.  QRS duration is 96 ms.   Assessment / Plan:   1. Intermittent atrial fibrillation she has remained in normal sinus rhythm.  Continue flecainide and other medications.  2. Diabetes mellitus-management per Dr.  Dagmar Hait   3. Hyperlipidemia-by Dr. Dagmar Hait     4. Hypertension  she is takking amlodipine as well as Olmasartan - HCT ( does not remember the dose She will call tomorrow and verify the dose    Mertie Moores, MD  11/07/2018 4:46 PM    [facility] [address]  [place], [place]  [address] Pager [phone] Phone: [phone]; Fax: [phone]

## 2018-11-07 NOTE — Patient Instructions (Addendum)
Medication Instructions:  Your physician has recommended you make the following change in your medication:   START Kdur (Potassium supplement) 10 mEq once twice daily  If you need a refill on your cardiac medications before your next appointment, please call your pharmacy.   Lab work: Your physician recommends that you return for lab work in: 3 weeks for basic metabolic panel on Mon Dec 23 You may come in anytime between 7:30 am and 4:30 pm, you do not have to fast  If you have labs (blood work) drawn today and your tests are completely normal, you will receive your results only by: Marland Kitchen MyChart Message (if you have MyChart) OR . A paper copy in the mail If you have any lab test that is abnormal or we need to change your treatment, we will call you to review the results.   Testing/Procedures: None Ordered   Follow-Up: At [facility], you and your health needs are our priority.  As part of our continuing mission to provide you with exceptional heart care, we have created designated Provider Care Teams.  These Care Teams include your primary Cardiologist (physician) and Advanced Practice Providers (APPs -  Physician Assistants and Nurse Practitioners) who all work together to provide you with the care you need, when you need it. You will need a follow up appointment in:  1 years.  Please call our office 2 months in advance to schedule this appointment.  You may see Mertie Moores, MD or one of the following Advanced Practice Providers on your designated Care Team: Richardson Dopp, PA-C [place], [place] . Daune Perch, NP

## 2018-11-14 ENCOUNTER — Other Ambulatory Visit: Payer: Self-pay | Admitting: Cardiovascular Disease

## 2018-11-16 DIAGNOSIS — M79672 Pain in left foot: Secondary | ICD-10-CM | POA: Diagnosis not present

## 2018-11-16 DIAGNOSIS — M21612 Bunion of left foot: Secondary | ICD-10-CM | POA: Diagnosis not present

## 2018-11-16 DIAGNOSIS — E139 Other specified diabetes mellitus without complications: Secondary | ICD-10-CM | POA: Diagnosis not present

## 2018-11-16 DIAGNOSIS — M65872 Other synovitis and tenosynovitis, left ankle and foot: Secondary | ICD-10-CM | POA: Diagnosis not present

## 2018-11-28 ENCOUNTER — Other Ambulatory Visit: Payer: Medicare Other

## 2018-12-15 DIAGNOSIS — I480 Paroxysmal atrial fibrillation: Secondary | ICD-10-CM | POA: Diagnosis not present

## 2018-12-15 DIAGNOSIS — Z7901 Long term (current) use of anticoagulants: Secondary | ICD-10-CM | POA: Diagnosis not present

## 2018-12-26 ENCOUNTER — Other Ambulatory Visit: Payer: Self-pay | Admitting: Internal Medicine

## 2019-01-09 DIAGNOSIS — E119 Type 2 diabetes mellitus without complications: Secondary | ICD-10-CM | POA: Diagnosis not present

## 2019-01-09 DIAGNOSIS — H40013 Open angle with borderline findings, low risk, bilateral: Secondary | ICD-10-CM | POA: Diagnosis not present

## 2019-01-09 DIAGNOSIS — H2513 Age-related nuclear cataract, bilateral: Secondary | ICD-10-CM | POA: Diagnosis not present

## 2019-01-09 DIAGNOSIS — H2511 Age-related nuclear cataract, right eye: Secondary | ICD-10-CM | POA: Diagnosis not present

## 2019-01-09 DIAGNOSIS — H35033 Hypertensive retinopathy, bilateral: Secondary | ICD-10-CM | POA: Diagnosis not present

## 2019-01-10 DIAGNOSIS — N390 Urinary tract infection, site not specified: Secondary | ICD-10-CM | POA: Diagnosis not present

## 2019-01-16 DIAGNOSIS — N390 Urinary tract infection, site not specified: Secondary | ICD-10-CM | POA: Diagnosis not present

## 2019-01-16 DIAGNOSIS — I480 Paroxysmal atrial fibrillation: Secondary | ICD-10-CM | POA: Diagnosis not present

## 2019-01-16 DIAGNOSIS — Z6835 Body mass index (BMI) 35.0-35.9, adult: Secondary | ICD-10-CM | POA: Diagnosis not present

## 2019-01-16 DIAGNOSIS — Z7901 Long term (current) use of anticoagulants: Secondary | ICD-10-CM | POA: Diagnosis not present

## 2019-02-01 DIAGNOSIS — H25811 Combined forms of age-related cataract, right eye: Secondary | ICD-10-CM | POA: Diagnosis not present

## 2019-02-01 DIAGNOSIS — H2511 Age-related nuclear cataract, right eye: Secondary | ICD-10-CM | POA: Diagnosis not present

## 2019-02-06 DIAGNOSIS — H2512 Age-related nuclear cataract, left eye: Secondary | ICD-10-CM | POA: Diagnosis not present

## 2019-02-06 DIAGNOSIS — H25012 Cortical age-related cataract, left eye: Secondary | ICD-10-CM | POA: Diagnosis not present

## 2019-02-16 DIAGNOSIS — Z7901 Long term (current) use of anticoagulants: Secondary | ICD-10-CM | POA: Diagnosis not present

## 2019-02-16 DIAGNOSIS — I480 Paroxysmal atrial fibrillation: Secondary | ICD-10-CM | POA: Diagnosis not present

## 2019-03-22 DIAGNOSIS — Z7901 Long term (current) use of anticoagulants: Secondary | ICD-10-CM | POA: Diagnosis not present

## 2019-03-22 DIAGNOSIS — I480 Paroxysmal atrial fibrillation: Secondary | ICD-10-CM | POA: Diagnosis not present

## 2019-03-28 DIAGNOSIS — N3000 Acute cystitis without hematuria: Secondary | ICD-10-CM | POA: Diagnosis not present

## 2019-03-28 DIAGNOSIS — N201 Calculus of ureter: Secondary | ICD-10-CM | POA: Diagnosis not present

## 2019-04-12 ENCOUNTER — Telehealth: Payer: Self-pay | Admitting: Cardiovascular Disease

## 2019-04-12 NOTE — Telephone Encounter (Signed)
Spoke with patient who states she is having pain in between her shoulder blades that radiates to both shoulders. States it started 3-4 days ago without known injury or physical exertion. Denies changes in sleeping pattern, pillow, or mattress. Also c/o nausea over the weekend but she cannot recall if it was associated with the pain. Denies diaphoresis or worsening SOB, "states I don't breath well anyway." States she has not been very active since the stay at home order and today is her first day back at work. I asked her questions to determine if pain worsens with movement or with palpation, which she confirms that it does worsen with movement. She states she would have a difficult time exerting herself to determine if pain was worse with exertion and better with rest. I advised her to monitor pain with movement, stretching, and ice to the area as well as to try low dose anti-inflammatory medications to see if any of these things help. I advised that if she notes that pain worsens with exertion to call back or if pain persists to go to the ED. I assured her that all proper precautions against Covid are being taken at [facility] should she need to go in for treatment. I asked her to call back in a few days to let us know how she is feeling. She verbalized understanding and agreement and thanked me for the call.

## 2019-04-12 NOTE — Telephone Encounter (Signed)
New Message   Patient states she's been having pain in her shoulders.  The pain hasn't been in her chest or going down her arms just located in her shoulder's and she would like to speak to a nurse about the issue.

## 2019-04-13 NOTE — Telephone Encounter (Signed)
Agree with note by Michelle Swinyer, RN  

## 2019-04-19 DIAGNOSIS — M542 Cervicalgia: Secondary | ICD-10-CM | POA: Diagnosis not present

## 2019-04-26 DIAGNOSIS — M5134 Other intervertebral disc degeneration, thoracic region: Secondary | ICD-10-CM | POA: Diagnosis not present

## 2019-04-26 DIAGNOSIS — Z7901 Long term (current) use of anticoagulants: Secondary | ICD-10-CM | POA: Diagnosis not present

## 2019-04-26 DIAGNOSIS — M5033 Other cervical disc degeneration, cervicothoracic region: Secondary | ICD-10-CM | POA: Diagnosis not present

## 2019-04-26 DIAGNOSIS — I480 Paroxysmal atrial fibrillation: Secondary | ICD-10-CM | POA: Diagnosis not present

## 2019-04-26 DIAGNOSIS — M9902 Segmental and somatic dysfunction of thoracic region: Secondary | ICD-10-CM | POA: Diagnosis not present

## 2019-04-26 DIAGNOSIS — M9901 Segmental and somatic dysfunction of cervical region: Secondary | ICD-10-CM | POA: Diagnosis not present

## 2019-05-09 DIAGNOSIS — N3000 Acute cystitis without hematuria: Secondary | ICD-10-CM | POA: Diagnosis not present

## 2019-05-30 DIAGNOSIS — I480 Paroxysmal atrial fibrillation: Secondary | ICD-10-CM | POA: Diagnosis not present

## 2019-05-30 DIAGNOSIS — Z7901 Long term (current) use of anticoagulants: Secondary | ICD-10-CM | POA: Diagnosis not present

## 2019-05-31 DIAGNOSIS — H25012 Cortical age-related cataract, left eye: Secondary | ICD-10-CM | POA: Diagnosis not present

## 2019-05-31 DIAGNOSIS — H25812 Combined forms of age-related cataract, left eye: Secondary | ICD-10-CM | POA: Diagnosis not present

## 2019-05-31 DIAGNOSIS — H2512 Age-related nuclear cataract, left eye: Secondary | ICD-10-CM | POA: Diagnosis not present

## 2019-07-05 DIAGNOSIS — Z7901 Long term (current) use of anticoagulants: Secondary | ICD-10-CM | POA: Diagnosis not present

## 2019-07-05 DIAGNOSIS — I480 Paroxysmal atrial fibrillation: Secondary | ICD-10-CM | POA: Diagnosis not present

## 2019-08-02 DIAGNOSIS — Z23 Encounter for immunization: Secondary | ICD-10-CM | POA: Diagnosis not present

## 2019-08-02 DIAGNOSIS — Z7901 Long term (current) use of anticoagulants: Secondary | ICD-10-CM | POA: Diagnosis not present

## 2019-08-02 DIAGNOSIS — I480 Paroxysmal atrial fibrillation: Secondary | ICD-10-CM | POA: Diagnosis not present

## 2019-09-27 DIAGNOSIS — M9902 Segmental and somatic dysfunction of thoracic region: Secondary | ICD-10-CM | POA: Diagnosis not present

## 2019-09-27 DIAGNOSIS — M5134 Other intervertebral disc degeneration, thoracic region: Secondary | ICD-10-CM | POA: Diagnosis not present

## 2019-09-27 DIAGNOSIS — M5136 Other intervertebral disc degeneration, lumbar region: Secondary | ICD-10-CM | POA: Diagnosis not present

## 2019-09-27 DIAGNOSIS — M9903 Segmental and somatic dysfunction of lumbar region: Secondary | ICD-10-CM | POA: Diagnosis not present

## 2019-10-13 DIAGNOSIS — Z1231 Encounter for screening mammogram for malignant neoplasm of breast: Secondary | ICD-10-CM | POA: Diagnosis not present

## 2019-11-15 DIAGNOSIS — I480 Paroxysmal atrial fibrillation: Secondary | ICD-10-CM | POA: Diagnosis not present

## 2019-11-15 DIAGNOSIS — Z7901 Long term (current) use of anticoagulants: Secondary | ICD-10-CM | POA: Diagnosis not present

## 2019-12-03 ENCOUNTER — Other Ambulatory Visit: Payer: Self-pay | Admitting: Cardiovascular Disease

## 2019-12-04 ENCOUNTER — Other Ambulatory Visit: Payer: Self-pay

## 2019-12-04 MED ORDER — METOPROLOL SUCCINATE ER 200 MG PO TB24: 200.0000 mg | ORAL_TABLET | Freq: Every day | ORAL | 0 refills | Status: DC

## 2020-01-02 DIAGNOSIS — Z7901 Long term (current) use of anticoagulants: Secondary | ICD-10-CM | POA: Diagnosis not present

## 2020-01-02 DIAGNOSIS — I480 Paroxysmal atrial fibrillation: Secondary | ICD-10-CM | POA: Diagnosis not present

## 2020-01-17 ENCOUNTER — Other Ambulatory Visit: Payer: Self-pay

## 2020-01-17 ENCOUNTER — Ambulatory Visit (INDEPENDENT_AMBULATORY_CARE_PROVIDER_SITE_OTHER): Payer: Medicare Other | Admitting: Cardiovascular Disease

## 2020-01-17 ENCOUNTER — Encounter: Payer: Self-pay | Admitting: Cardiovascular Disease

## 2020-01-17 DIAGNOSIS — I10 Essential (primary) hypertension: Secondary | ICD-10-CM | POA: Diagnosis not present

## 2020-01-17 DIAGNOSIS — I480 Paroxysmal atrial fibrillation: Secondary | ICD-10-CM | POA: Diagnosis not present

## 2020-01-17 NOTE — Patient Instructions (Signed)
Medication Instructions:  Your physician recommends that you continue on your current medications as directed. Please refer to the Current Medication list given to you today.  *If you need a refill on your cardiac medications before your next appointment, please call your pharmacy*  Lab Work: None If you have labs (blood work) drawn today and your tests are completely normal, you will receive your results only by: Marland Kitchen MyChart Message (if you have MyChart) OR . A paper copy in the mail If you have any lab test that is abnormal or we need to change your treatment, we will call you to review the results.  Testing/Procedures: None  Follow-Up: At [facility], you and your health needs are our priority.  As part of our continuing mission to provide you with exceptional heart care, we have created designated Provider Care Teams.  These Care Teams include your primary Cardiologist (physician) and Advanced Practice Providers (APPs -  Physician Assistants and Nurse Practitioners) who all work together to provide you with the care you need, when you need it.  Your next appointment:   1 year(s)  The format for your next appointment:   In Person  Provider:   You will see one of the following Advanced Practice Providers on your designated Care Team:    Richardson Dopp, PA-C  [place], [place]  Daune Perch, NP   Other Instructions

## 2020-01-17 NOTE — Progress Notes (Signed)
Peggy Chen Date of Birth  03-Apr-1951       [facility] [address], [address], [place], [place]  [address]   [place], [place]  [address] [phone]     [phone]   Fax  [phone]    Fax [phone]  Problem List: 1. Intermittent atrial fibrillation 2. Diabetes mellitus 3. Hyperlipidemia 4. Hypertension     69 year old female with a history of intermittent atrial fibrillation. She also has a history of diabetes mellitus, hyperlipidemia, and hypertension. She still occasionally has episodes of A-Fib that last 1-3 days.  They can be as short as 3 hours.  She takes propranolol which seems to help.  She is on coumadin.   She does not exercise much at all.  She actually feels better when she is active.    February 26, 2014:  Still having some atrial fib.  On coumadin.    May 16, 2014: We attempted cardioversion [place] on Apr 17, 2014.  It was not successful. Since that time, we have started her on flecainide. [name] converted to sinus rhythm with the flecainide.  Sept. 19, 2017:  Has had many life changes Lost her husband Dec. 2015 Daughter is in jail for heroin   She is maintaining NSR Is on flecainide.  Has occasional afib episodes if she misses a dose.  November 07, 2018: Seen back today after 2-year absence. She was seen by Robbie Lis last year .    She has had some ortho surgery since I last saw her   Has been falling  Has fallen 3 times over the past year.  Mostly due lack of balance  Ive suggested she discuss with her primary MD - may need a neuro consult  Has lost lots of strength in her legs.   Feb. 10, 2021  Doing ok .  Occasional palps . Not getting any exercise.   No CP or dyspnea.  Still using a cane for balance.   No further falls this year.  Going for her labs tomorrow for her physical  Wt is 210 lbs.    Current Outpatient Medications on File Prior to Visit  Medication Sig  Dispense Refill  . amLODipine (NORVASC) 5 MG tablet Take 5 mg by mouth daily.    Marland Kitchen aspirin 81 MG tablet Take 81 mg by mouth daily.     . citalopram (CELEXA) 10 MG tablet Take 10 mg by mouth daily.    . cyanocobalamin 1000 MCG tablet Take 1,000 mcg by mouth daily.     . flecainide (TAMBOCOR) 150 MG tablet TAKE 1 TABLET BY MOUTH TWICE DAILY PLEASE.  MAKE  YEARLY  APPOINTMENT  WITH  DR.  Acie Fredrickson  FOR  NOVEMBER  FOR  FUTURE  REFILLS  1  ATTEMPT 180 tablet 3  . HYDROcodone-acetaminophen (NORCO/VICODIN) 5-325 MG tablet Take 1-2 tablets by mouth every 6 (six) hours as needed. 10 tablet 0  . JENTADUETO 2.04-999 MG TABS Take 1 tablet by mouth 2 (two) times daily.    . Magnesium Oxide 400 MG CAPS Take 1 capsule (400 mg total) by mouth daily. 30 capsule 0  . metoprolol (TOPROL-XL) 200 MG 24 hr tablet Take 1 tablet (200 mg total) by mouth daily. 90 tablet 0  . potassium chloride (K-DUR) 10 MEQ tablet Take 1 tablet (10 mEq total) by mouth 2 (two) times daily. 60 tablet 11  . warfarin (COUMADIN) 5 MG tablet Take  5-7.5 mg by mouth See admin instructions. 5 mg at bedtime on Sun/Mon/Tues/Thurs/Fri/Sat and 7.5 mg on Wed    . zolpidem (AMBIEN) 10 MG tablet Take 10 mg by mouth at bedtime as needed for sleep.     No current facility-administered medications on file prior to visit.    Allergies  Allergen Reactions  . Penicillins Hives    Has patient had a PCN reaction causing immediate rash, facial/tongue/throat swelling, SOB or lightheadedness with hypotension: Hives Has patient had a PCN reaction causing severe rash involving mucus membranes or skin necrosis: Unk Has patient had a PCN reaction that required hospitalization: No Has patient had a PCN reaction occurring within the last 10 years: No If all of the above answers are "NO", then may proceed with Cephalosporin use.     Past Medical History:  Diagnosis Date  . AF (atrial fibrillation) ([place])   . Anxiety   . Arthritis   . Asthma    Bronchitis  .  Atherosclerosis 11/25/2017   noted on CT  . Back pain   . Cancer [facility])    questionable skin cancer on face years ago  . Depression   . Diabetes mellitus   . Diverticulosis 11/25/2017   mild, noted on CT  . Dyspnea    Afib  . Fatty liver 11/25/2017   noted on CT  . History of kidney stones   . Hydronephrosis 11/25/2017   mild left noted on CT  . Hyperlipidemia   . Hypertension   . Renal cyst 11/2017   Left, noted on [place]  . UTI (urinary tract infection)     Past Surgical History:  Procedure Laterality Date  . BREAST CYST REMOVAL     RIGHT BREAST  . CARDIOVERSION N/A 04/17/2014   Procedure: CARDIOVERSION;  Surgeon: Thayer Headings, MD;  Location: [facility];  Service: Cardiovascular;  Laterality: N/A;  . CARDIOVERSION  05/2014   attempted DC failed  . COLON SURGERY     May 2000  . COLONOSCOPY    . CYSTOSCOPY/URETEROSCOPY/HOLMIUM LASER/STENT PLACEMENT Left 11/26/2017   Procedure: CYSTOSCOPY/STENT PLACEMENT;  Surgeon: Raynelle Bring, MD;  Location[facility];  Service: Urology;  Laterality: Left;  . CYSTOSCOPY/URETEROSCOPY/HOLMIUM LASER/STENT PLACEMENT Left 12/23/2017   Procedure: CYSTOSCOPY/ LEFT URETEROSCOPY;  Surgeon: Raynelle Bring, MD;  [facility];  Service: Urology;  Laterality: Left;  . TONSILLECTOMY    . US ECHOCARDIOGRAPHY  12/30/2009   EF 55-60%    Social History   Tobacco Use  Smoking Status Former Smoker  . Quit date: 06/22/1986  . Years since quitting: 33.5  Smokeless Tobacco Never Used    Social History   Substance and Sexual Activity  Alcohol Use Yes   Comment: occasional    Family History  Problem Relation Age of Onset  . COPD Mother   . Heart attack Father     Reviw of Systems:  Reviewed in the HPI.  All other systems are negative.  Physical Exam: Blood pressure 118/60, pulse (!) 57, height 5\' 5"  (1.651 m), weight 210 lb (95.3 kg), SpO2 98 %.  GEN:  Well nourished, well developed in no acute distress HEENT: Normal NECK: No JVD; No  carotid bruits LYMPHATICS: No lymphadenopathy CARDIAC: RRR , no murmurs, rubs, gallops RESPIRATORY:  Clear to auscultation without rales, wheezing or rhonchi  ABDOMEN: Soft, non-tender, non-distended MUSCULOSKELETAL:  No edema; No deformity  SKIN: Warm and dry NEUROLOGIC:  Alert and oriented x 3   ECG: January 17, 2020: Sinus bradycardia 57 beats  a minute.  First-degree AV block. Assessment / Plan:   1. Intermittent atrial fibrillation she has remained in normal sinus rhythm.   Continue flecainide.  Her EKG looks okay.  She has sinus bradycardia with a first-degree AV block.  QRS duration is normal.  She will have her labs drawn by Dr. Elsworth Soho tomorrow.    2. Diabetes mellitus-Dr. Office currently managing her diabetes.  3. Hyperlipidemia-managed by Dr. Elsworth Soho.   4. Hypertension Blood pressure is currently well controlled.  Continue current medications.  I encouraged her to continue working on a diet, exercise, weight loss program.   Mertie Moores, MD  01/17/2020 4:44 PM    [facility] [place],  [place], [place]  [address] Pager [phone] Phone: [phone]; Fax: [phone]

## 2020-01-18 DIAGNOSIS — E1151 Type 2 diabetes mellitus with diabetic peripheral angiopathy without gangrene: Secondary | ICD-10-CM | POA: Diagnosis not present

## 2020-01-18 DIAGNOSIS — E7849 Other hyperlipidemia: Secondary | ICD-10-CM | POA: Diagnosis not present

## 2020-01-24 DIAGNOSIS — R82998 Other abnormal findings in urine: Secondary | ICD-10-CM | POA: Diagnosis not present

## 2020-01-24 DIAGNOSIS — I10 Essential (primary) hypertension: Secondary | ICD-10-CM | POA: Diagnosis not present

## 2020-01-25 DIAGNOSIS — N1832 Chronic kidney disease, stage 3b: Secondary | ICD-10-CM | POA: Diagnosis not present

## 2020-01-25 DIAGNOSIS — E785 Hyperlipidemia, unspecified: Secondary | ICD-10-CM | POA: Diagnosis not present

## 2020-01-25 DIAGNOSIS — E1151 Type 2 diabetes mellitus with diabetic peripheral angiopathy without gangrene: Secondary | ICD-10-CM | POA: Diagnosis not present

## 2020-01-25 DIAGNOSIS — I2510 Atherosclerotic heart disease of native coronary artery without angina pectoris: Secondary | ICD-10-CM | POA: Diagnosis not present

## 2020-01-25 DIAGNOSIS — E669 Obesity, unspecified: Secondary | ICD-10-CM | POA: Diagnosis not present

## 2020-01-25 DIAGNOSIS — I129 Hypertensive chronic kidney disease with stage 1 through stage 4 chronic kidney disease, or unspecified chronic kidney disease: Secondary | ICD-10-CM | POA: Diagnosis not present

## 2020-01-25 DIAGNOSIS — M1990 Unspecified osteoarthritis, unspecified site: Secondary | ICD-10-CM | POA: Diagnosis not present

## 2020-01-25 DIAGNOSIS — G629 Polyneuropathy, unspecified: Secondary | ICD-10-CM | POA: Diagnosis not present

## 2020-01-25 DIAGNOSIS — J309 Allergic rhinitis, unspecified: Secondary | ICD-10-CM | POA: Diagnosis not present

## 2020-01-25 DIAGNOSIS — I480 Paroxysmal atrial fibrillation: Secondary | ICD-10-CM | POA: Diagnosis not present

## 2020-01-25 DIAGNOSIS — Z0000 Encounter for general adult medical examination without abnormal findings: Secondary | ICD-10-CM | POA: Diagnosis not present

## 2020-01-25 DIAGNOSIS — Z7901 Long term (current) use of anticoagulants: Secondary | ICD-10-CM | POA: Diagnosis not present

## 2020-01-25 DIAGNOSIS — Z1331 Encounter for screening for depression: Secondary | ICD-10-CM | POA: Diagnosis not present

## 2020-01-31 DIAGNOSIS — Z1212 Encounter for screening for malignant neoplasm of rectum: Secondary | ICD-10-CM | POA: Diagnosis not present

## 2020-02-08 DIAGNOSIS — I480 Paroxysmal atrial fibrillation: Secondary | ICD-10-CM | POA: Diagnosis not present

## 2020-02-08 DIAGNOSIS — W5503XA Scratched by cat, initial encounter: Secondary | ICD-10-CM | POA: Diagnosis not present

## 2020-02-08 DIAGNOSIS — Z7901 Long term (current) use of anticoagulants: Secondary | ICD-10-CM | POA: Diagnosis not present

## 2020-02-16 DIAGNOSIS — R195 Other fecal abnormalities: Secondary | ICD-10-CM | POA: Diagnosis not present

## 2020-02-16 DIAGNOSIS — I4891 Unspecified atrial fibrillation: Secondary | ICD-10-CM | POA: Diagnosis not present

## 2020-02-16 DIAGNOSIS — K529 Noninfective gastroenteritis and colitis, unspecified: Secondary | ICD-10-CM | POA: Diagnosis not present

## 2020-02-16 DIAGNOSIS — Z7901 Long term (current) use of anticoagulants: Secondary | ICD-10-CM | POA: Diagnosis not present

## 2020-02-20 ENCOUNTER — Telehealth: Payer: Self-pay | Admitting: *Deleted

## 2020-02-20 NOTE — Telephone Encounter (Signed)
Patient with diagnosis of afib on warfarin for anticoagulation.    Procedure: COLONOSCOPY Date of procedure: TBD  CHADS2-VASc score of  4 (HTN, AGE, DM2, female)  Per office protocol, patient can hold warfarin for 5 days prior to procedure.    Patient will NOT need bridging with Lovenox (enoxaparin) around procedure.

## 2020-02-20 NOTE — Telephone Encounter (Signed)
   [facility] Pre-operative Risk Assessment    Request for surgical clearance:  1. What type of surgery is being performed?  COLONOSCOPY   2. When is this surgery scheduled?  TBD   3. What type of clearance is required (medical clearance vs. Pharmacy clearance to hold med vs. Both)?  both  4. Are there any medications that need to be held prior to surgery and how long? COUMADIN   5. Practice name and name of physician performing surgery?  [facility] / DR. MAGOD   6. What is your office phone number [phone]    7.   What is your office fax number [phone]  8.   Anesthesia type (None, local, MAC, general) ?  PROPOFOL   Peggy Chen 02/20/2020, 1:57 PM  _________________________________________________________________   (provider comments below)

## 2020-02-29 ENCOUNTER — Other Ambulatory Visit: Payer: Self-pay | Admitting: Cardiovascular Disease

## 2020-03-12 DIAGNOSIS — Z7901 Long term (current) use of anticoagulants: Secondary | ICD-10-CM | POA: Diagnosis not present

## 2020-03-12 DIAGNOSIS — I480 Paroxysmal atrial fibrillation: Secondary | ICD-10-CM | POA: Diagnosis not present

## 2020-04-04 DIAGNOSIS — Z1159 Encounter for screening for other viral diseases: Secondary | ICD-10-CM | POA: Diagnosis not present

## 2020-04-09 DIAGNOSIS — R195 Other fecal abnormalities: Secondary | ICD-10-CM | POA: Diagnosis not present

## 2020-04-09 DIAGNOSIS — D128 Benign neoplasm of rectum: Secondary | ICD-10-CM | POA: Diagnosis not present

## 2020-04-09 DIAGNOSIS — K5730 Diverticulosis of large intestine without perforation or abscess without bleeding: Secondary | ICD-10-CM | POA: Diagnosis not present

## 2020-04-11 DIAGNOSIS — Z7901 Long term (current) use of anticoagulants: Secondary | ICD-10-CM | POA: Diagnosis not present

## 2020-04-11 DIAGNOSIS — I480 Paroxysmal atrial fibrillation: Secondary | ICD-10-CM | POA: Diagnosis not present

## 2020-04-12 DIAGNOSIS — D128 Benign neoplasm of rectum: Secondary | ICD-10-CM | POA: Diagnosis not present

## 2020-05-07 DIAGNOSIS — N1832 Chronic kidney disease, stage 3b: Secondary | ICD-10-CM | POA: Diagnosis not present

## 2020-05-07 DIAGNOSIS — I10 Essential (primary) hypertension: Secondary | ICD-10-CM | POA: Diagnosis not present

## 2020-05-07 DIAGNOSIS — I2510 Atherosclerotic heart disease of native coronary artery without angina pectoris: Secondary | ICD-10-CM | POA: Diagnosis not present

## 2020-05-07 DIAGNOSIS — E1151 Type 2 diabetes mellitus with diabetic peripheral angiopathy without gangrene: Secondary | ICD-10-CM | POA: Diagnosis not present

## 2020-05-07 DIAGNOSIS — G629 Polyneuropathy, unspecified: Secondary | ICD-10-CM | POA: Diagnosis not present

## 2020-05-07 DIAGNOSIS — I480 Paroxysmal atrial fibrillation: Secondary | ICD-10-CM | POA: Diagnosis not present

## 2020-05-07 DIAGNOSIS — Z7901 Long term (current) use of anticoagulants: Secondary | ICD-10-CM | POA: Diagnosis not present

## 2020-05-07 DIAGNOSIS — E669 Obesity, unspecified: Secondary | ICD-10-CM | POA: Diagnosis not present

## 2020-05-07 DIAGNOSIS — M1990 Unspecified osteoarthritis, unspecified site: Secondary | ICD-10-CM | POA: Diagnosis not present

## 2020-05-07 DIAGNOSIS — D126 Benign neoplasm of colon, unspecified: Secondary | ICD-10-CM | POA: Diagnosis not present

## 2020-05-15 DIAGNOSIS — I480 Paroxysmal atrial fibrillation: Secondary | ICD-10-CM | POA: Diagnosis not present

## 2020-05-15 DIAGNOSIS — Z7901 Long term (current) use of anticoagulants: Secondary | ICD-10-CM | POA: Diagnosis not present

## 2020-05-20 ENCOUNTER — Other Ambulatory Visit: Payer: Self-pay | Admitting: Internal Medicine

## 2020-05-20 DIAGNOSIS — N1832 Chronic kidney disease, stage 3b: Secondary | ICD-10-CM

## 2020-05-30 ENCOUNTER — Ambulatory Visit
Admission: RE | Admit: 2020-05-30 | Discharge: 2020-05-30 | Disposition: A | Discharge status: Home / Self Care | Payer: Medicare Other | Source: Ambulatory Visit | Attending: Internal Medicine | Admitting: Internal Medicine

## 2020-05-30 ENCOUNTER — Other Ambulatory Visit: Payer: Self-pay

## 2020-05-30 DIAGNOSIS — N1832 Chronic kidney disease, stage 3b: Secondary | ICD-10-CM

## 2020-06-18 ENCOUNTER — Telehealth: Payer: Self-pay | Admitting: Cardiovascular Disease

## 2020-06-18 NOTE — Telephone Encounter (Signed)
Called patient back about her message. Patient stated that Dr. Radene Gunning ordered a renal US due to lab work and it showed -  Directed duplex of the renal arteries demonstrates a proximal right renal artery stenosis.  Directed duplex negative for left-sided renal artery stenosis.  Bilateral renal cysts.  Will forward to Dr. Acie Fredrickson for advisement to see if he thinks patient should be seen sooner or if she needs to be referred to anyone else.

## 2020-06-18 NOTE — Telephone Encounter (Signed)
Patient has a referral for a possible blockage of the artery to her right kidney. She is scheduled 07/10/2020 and would like to know if she could be worked in sooner.

## 2020-06-20 NOTE — Telephone Encounter (Signed)
I have placed a call to Dr. Dagmar Hait to discuss what were the indications for which he ordered the renal duplex I have called the patient and she is feeling well.   BP is not excessive She thinks it was an increase of one of her labs ( I suspect her creatinine )  Will get information from Dr. Dagmar Hait and then discuss with our [place] team .

## 2020-06-21 ENCOUNTER — Telehealth: Payer: Self-pay | Admitting: Cardiovascular Disease

## 2020-06-21 NOTE — Telephone Encounter (Signed)
Dr. Dagmar Chen noticed a slight increase in creatinine and ordered a renal artery duplex scan showing a Right renal artery stenosis  I had Dr. Fletcher Anon review the scan and he determined that it was only a moderate stenosis and could be followed for now  Her BP is still well controlled.   She is not having any CHF symptoms. She will continue to watch her salt.  I do not have any recent chol levels on her - I 'm sure Dr. Dagmar Chen has drawn some in the past year  Her LDL goal is 70 since she has vascular disease.   I will see her in several weeks.    Peggy Moores, MD  06/21/2020 1:02 PM    [facility] [place],  [place], [place]  [address] Phone: [phone]; Fax: [phone]

## 2020-06-25 DIAGNOSIS — H26491 Other secondary cataract, right eye: Secondary | ICD-10-CM | POA: Diagnosis not present

## 2020-06-27 DIAGNOSIS — Z7901 Long term (current) use of anticoagulants: Secondary | ICD-10-CM | POA: Diagnosis not present

## 2020-06-27 DIAGNOSIS — I480 Paroxysmal atrial fibrillation: Secondary | ICD-10-CM | POA: Diagnosis not present

## 2020-07-09 ENCOUNTER — Encounter: Payer: Self-pay | Admitting: Physician Assistant

## 2020-07-09 DIAGNOSIS — H26492 Other secondary cataract, left eye: Secondary | ICD-10-CM | POA: Diagnosis not present

## 2020-07-09 NOTE — Progress Notes (Deleted)
Cardiology Office Note    Date:  07/09/2020   ID:  Peggy Chen, Peggy Chen December 22, 1950, MRN [id number]  PCP:  [name], MD  Cardiologist:  Mertie Moores, MD  Electrophysiologist:  None   Chief Complaint: discuss kidney artery narrowing  History of Present Illness:   Peggy Chen is a 69 y.o. female with history of PAF on flecainide, sinus bradycardia, first degree AVB, DM, HTN, HLD, previous fall due to balance issues, fatty liver, atherosclerosis (non-descriptly noted on renal stone CT 2018), anxiety, depression, diverticulosis, kidney stone who presents back for follow-up.  She follows with Dr. Acie Fredrickson for the above history of intermittent atrial fibrillation. She required DCCV in 2015. She has since been managed with flecainide and anticoagultion. 2D echo 03/2014 showed EF 50-55%, mild LAE. NST 05/2014 was normal. Her PCP recently did a renal artery duplex for elevated creatinine which demonstrated a proximal right renal artery stenosis as well as bilateral renal cysts. Dr. Acie Fredrickson reviewed study with [facility] physician Dr. Fletcher Anon who determined that it was only a moderate stenosis and could be followed for now.  EKG this time need labs for flecainide ? lipids for athero Does she also need ASA?  Right renal artery stenosis Essential HTN Paroxysmal atrial fibrillation Atherosclerosis   Labwork independently reviewed:  KPN: *** 10/2018 BUN 20, Cr 1.0, K 4.0, Hgb 12.8, LDL 78, TSH WNL   Past Medical History:  Diagnosis Date  . Anxiety   . Arthritis   . Asthma    Bronchitis  . Atherosclerosis 11/25/2017   noted on CT  . Back pain   . Depression   . Diabetes mellitus   . Diverticulosis 11/25/2017   mild, noted on CT  . Fatty liver 11/25/2017   noted on CT  . History of kidney stones   . Hydronephrosis 11/25/2017   mild left noted on CT  . Hyperlipidemia   . Hypertension   . PAF (paroxysmal atrial fibrillation) ([place])   . Renal cyst 11/2017   Left, noted on [place]  .  Right renal artery stenosis ([place])   . Skin cancer    questionable skin cancer on face years ago  . UTI (urinary tract infection)     Past Surgical History:  Procedure Laterality Date  . BREAST CYST REMOVAL     RIGHT BREAST  . CARDIOVERSION N/A 04/17/2014   Procedure: CARDIOVERSION;  Surgeon: Thayer Headings, MD;  Location: [facility];  Service: Cardiovascular;  Laterality: N/A;  . CARDIOVERSION  05/2014   attempted DC failed  . COLON SURGERY     May 2000  . COLONOSCOPY    . CYSTOSCOPY/URETEROSCOPY/HOLMIUM LASER/STENT PLACEMENT Left 11/26/2017   Procedure: CYSTOSCOPY/STENT PLACEMENT;  Surgeon: Raynelle Bring, MD;  [facility];  Service: Urology;  Laterality: Left;  . CYSTOSCOPY/URETEROSCOPY/HOLMIUM LASER/STENT PLACEMENT Left 12/23/2017   Procedure: CYSTOSCOPY/ LEFT URETEROSCOPY;  Surgeon: Raynelle Bring, MD;  [facility];  Service: Urology;  Laterality: Left;  . TONSILLECTOMY    . US ECHOCARDIOGRAPHY  12/30/2009   EF 55-60%    Current Medications: No outpatient medications have been marked as taking for the 07/10/20 encounter (Appointment) with Charlie Pitter, PA-C.   ***   Allergies:   Penicillins   Social History   Socioeconomic History  . Marital status: Married    Spouse name: Not on file  . Number of children: Not on file  . Years of education: Not on file  . Highest education level: Not on file  Occupational History  .  Not on file  Tobacco Use  . Smoking status: Former Smoker    Quit date: 06/22/1986    Years since quitting: 34.0  . Smokeless tobacco: Never Used  Vaping Use  . Vaping Use: Never used  Substance and Sexual Activity  . Alcohol use: Yes    Comment: occasional  . Drug use: No  . Sexual activity: Not on file  Other Topics Concern  . Not on file  Social History Narrative  . Not on file   Social Determinants of Health   Financial Resource Strain:   . Difficulty of Paying Living Expenses:   Food Insecurity:   . Worried About Paediatric nurse in the Last Year:   . Arboriculturist in the Last Year:   Transportation Needs:   . Film/video editor (Medical):   Marland Kitchen Lack of Transportation (Non-Medical):   Physical Activity:   . Days of Exercise per Week:   . Minutes of Exercise per Session:   Stress:   . Feeling of Stress :   Social Connections:   . Frequency of Communication with Friends and Family:   . Frequency of Social Gatherings with Friends and Family:   . Attends Religious Services:   . Active Member of Clubs or Organizations:   . Attends Archivist Meetings:   Marland Kitchen Marital Status:      Family History:  The patient's ***family history includes COPD in her mother; Heart attack in her father.  ROS:   Please see the history of present illness. Otherwise, review of systems is positive for ***.  All other systems are reviewed and otherwise negative.    EKGs/Labs/Other Studies Reviewed:    Studies reviewed are outlined and summarized above. Reports included below if pertinent.  2D echo 2015 - Left ventricle: The cavity size was normal. Wall thickness  was normal. Systolic function was normal. The estimated  ejection fraction was in the range of 50% to 55%. Wall  motion was normal; there were no regional wall motion  abnormalities.  - Left atrium: The atrium was mildly dilated.  - Atrial septum: No defect or patent foramen ovale was  identified.   NST 05/2014  Impression Exercise Capacity:  Lexiscan with low level exercise. BP Response:  Normal blood pressure response. Clinical Symptoms:  There is dyspnea. ECG Impression:  No significant ST segment change suggestive of ischemia. Comparison with Prior Nuclear Study: No significant change from previous study  Overall Impression:  Normal stress nuclear study.  LV Ejection Fraction: 63%.  LV Wall Motion:  NL LV Function; NL Wall Motion    EKG:  EKG is ordered today, personally reviewed, demonstrating ***  Recent Labs: No  results found for requested labs within last 8760 hours.  Recent Lipid Panel No results found for: CHOL, TRIG, HDL, CHOLHDL, VLDL, LDLCALC, LDLDIRECT  PHYSICAL EXAM:    VS:  There were no vitals taken for this visit.  BMI: There is no height or weight on file to calculate BMI.  GEN: Well nourished, well developed, in no acute distress HEENT: normocephalic, atraumatic Neck: no JVD, carotid bruits, or masses Cardiac: ***RRR; no murmurs, rubs, or gallops, no edema  Respiratory:  clear to auscultation bilaterally, normal work of breathing GI: soft, nontender, nondistended, + BS MS: no deformity or atrophy Skin: warm and dry, no rash Neuro:  Alert and Oriented x 3, Strength and sensation are intact, follows commands Psych: euthymic mood, full affect  Wt Readings from Last 3  Encounters:  01/17/20 210 lb (95.3 kg)  11/07/18 209 lb (94.8 kg)  12/23/17 203 lb (92.1 kg)     ASSESSMENT & PLAN:   1. ***  Disposition: F/u with ***   Medication Adjustments/Labs and Tests Ordered: Current medicines are reviewed at length with the patient today.  Concerns regarding medicines are outlined above. Medication changes, Labs and Tests ordered today are summarized above and listed in the Patient Instructions accessible in Encounters.   Signed, Charlie Pitter, PA-C  07/09/2020 8:38 AM    [facility], [place], [place]  [address] Phone: [phone]; Fax: [phone]

## 2020-07-10 ENCOUNTER — Ambulatory Visit: Payer: Medicare Other | Admitting: Physician Assistant

## 2020-07-10 DIAGNOSIS — I7090 Unspecified atherosclerosis: Secondary | ICD-10-CM

## 2020-07-10 DIAGNOSIS — I701 Atherosclerosis of renal artery: Secondary | ICD-10-CM

## 2020-07-10 DIAGNOSIS — K220 Achalasia of cardia: Secondary | ICD-10-CM

## 2020-07-10 DIAGNOSIS — I480 Paroxysmal atrial fibrillation: Secondary | ICD-10-CM

## 2020-07-18 NOTE — Progress Notes (Signed)
Peggy Chen Date of Birth  24-Apr-1951       [place] [address], [address], [place], [place]  [address]   [place], [place]  [address] [phone]     [phone]   Fax  [phone]    Fax [phone]  Problem List: 1. Intermittent atrial fibrillation 2. Diabetes mellitus 3. Hyperlipidemia 4. Hypertension     69 year old female with a history of intermittent atrial fibrillation. She also has a history of diabetes mellitus, hyperlipidemia, and hypertension. She still occasionally has episodes of A-Fib that last 1-3 days.  They can be as short as 3 hours.  She takes propranolol which seems to help.  She is on coumadin.   She does not exercise much at all.  She actually feels better when she is active.    February 26, 2014:  Still having some atrial fib.  On coumadin.    May 16, 2014: We attempted cardioversion [place] on Apr 17, 2014.  It was not successful. Since that time, we have started her on flecainide. [name] converted to sinus rhythm with the flecainide.  Sept. 19, 2017:  Has had many life changes Lost her husband Dec. 2015 Daughter is in jail for heroin   She is maintaining NSR Is on flecainide.  Has occasional afib episodes if she misses a dose.  November 07, 2018: Seen back today after 2-year absence. She was seen by Robbie Lis last year .    She has had some ortho surgery since I last saw her   Has been falling  Has fallen 3 times over the past year.  Mostly due lack of balance  Ive suggested she discuss with her primary MD - may need a neuro consult  Has lost lots of strength in her legs.   Feb. 10, 2021  Doing ok .  Occasional palps . Not getting any exercise.   No CP or dyspnea.  Still using a cane for balance.   No further falls this year.  Going for her labs tomorrow for her physical  Wt is 210 lbs.    July 19, 2020:   Peggy Chen is seen today for follow-up visit.  She has  a history of paroxysmal atrial fibrillation.    Dr. Dagmar Hait noticed a slight increase in her creatinine several months ago and ordered a renal artery duplex scan.  This revealed a right renal artery stenosis.  I reviewed the scans with Dr. Fletcher Anon.  He determined that was only a moderate degree of stenosis and can be followed clinically for now.  Her blood pressure has been fairly well controlled.  She does not have any signs or symptoms of CHF.  Lipids look good  Has not been exercising Has lost muscle mass ,  Difficult to stand for long periods of times  We discussed the importance of regular exercise     Current Outpatient Medications on File Prior to Visit  Medication Sig Dispense Refill  . amLODipine (NORVASC) 5 MG tablet Take 5 mg by mouth daily.    Marland Kitchen aspirin 81 MG tablet Take 81 mg by mouth daily.     . citalopram (CELEXA) 10 MG tablet Take 10 mg by mouth daily.    . colesevelam (WELCHOL) 625 MG tablet Take 1-3 tablets by mouth as needed.    . cyanocobalamin 1000 MCG tablet Take 1,000 mcg by mouth daily.     . flecainide (TAMBOCOR) 150  MG tablet Take 1 tablet (150 mg total) by mouth 2 (two) times daily. 180 tablet 3  . HYDROcodone-acetaminophen (NORCO/VICODIN) 5-325 MG tablet Take 1-2 tablets by mouth every 6 (six) hours as needed. 10 tablet 0  . JENTADUETO 2.04-999 MG TABS Take 1 tablet by mouth 2 (two) times daily.    . metoprolol (TOPROL-XL) 200 MG 24 hr tablet Take 1 tablet by mouth once daily 90 tablet 3  . olmesartan-hydrochlorothiazide (BENICAR HCT) 20-12.5 MG tablet Take 1 tablet by mouth daily.    . potassium chloride (K-DUR) 10 MEQ tablet Take 1 tablet (10 mEq total) by mouth 2 (two) times daily. 60 tablet 11  . warfarin (COUMADIN) 5 MG tablet Take 5-7.5 mg by mouth See admin instructions. 5 mg at bedtime on Sun/Mon/Tues/Thurs/Fri/Sat and 7.5 mg on Wed    . zolpidem (AMBIEN) 5 MG tablet Take 5 mg by mouth daily.     No current facility-administered medications on file prior  to visit.    Allergies  Allergen Reactions  . Penicillins Hives    Has patient had a PCN reaction causing immediate rash, facial/tongue/throat swelling, SOB or lightheadedness with hypotension: Hives Has patient had a PCN reaction causing severe rash involving mucus membranes or skin necrosis: Unk Has patient had a PCN reaction that required hospitalization: No Has patient had a PCN reaction occurring within the last 10 years: No If all of the above answers are "NO", then may proceed with Cephalosporin use.     Past Medical History:  Diagnosis Date  . Anxiety   . Arthritis   . Asthma    Bronchitis  . Atherosclerosis 11/25/2017   noted on CT  . Back pain   . Depression   . Diabetes mellitus   . Diverticulosis 11/25/2017   mild, noted on CT  . Fatty liver 11/25/2017   noted on CT  . First degree AV block   . History of kidney stones   . Hydronephrosis 11/25/2017   mild left noted on CT  . Hyperlipidemia   . Hypertension   . PAF (paroxysmal atrial fibrillation) ([facility])   . Renal cyst 11/2017   Left, noted on [place]  . Right renal artery stenosis ([facility])   . Sinus bradycardia   . Skin cancer    questionable skin cancer on face years ago  . UTI (urinary tract infection)     Past Surgical History:  Procedure Laterality Date  . BREAST CYST REMOVAL     RIGHT BREAST  . CARDIOVERSION N/A 04/17/2014   Procedure: CARDIOVERSION;  Surgeon: Thayer Headings, MD;  Location: [facility];  Service: Cardiovascular;  Laterality: N/A;  . CARDIOVERSION  05/2014   attempted DC failed  . COLON SURGERY     May 2000  . COLONOSCOPY    . CYSTOSCOPY/URETEROSCOPY/HOLMIUM LASER/STENT PLACEMENT Left 11/26/2017   Procedure: CYSTOSCOPY/STENT PLACEMENT;  Surgeon: Raynelle Bring, MD;  Location: [facility];  Service: Urology;  Laterality: Left;  . CYSTOSCOPY/URETEROSCOPY/HOLMIUM LASER/STENT PLACEMENT Left 12/23/2017   Procedure: CYSTOSCOPY/ LEFT URETEROSCOPY;  Surgeon: Raynelle Bring, MD;  Location: [facility];   Service: Urology;  Laterality: Left;  . TONSILLECTOMY    . US ECHOCARDIOGRAPHY  12/30/2009   EF 55-60%    Social History   Tobacco Use  Smoking Status Former Smoker  . Quit date: 06/22/1986  . Years since quitting: 34.0  Smokeless Tobacco Never Used    Social History   Substance and Sexual Activity  Alcohol Use Yes   Comment: occasional  Family History  Problem Relation Age of Onset  . COPD Mother   . Heart attack Father     Reviw of Systems:  Reviewed in the HPI.  All other systems are negative.  Physical Exam: Blood pressure 96/62, pulse (!) 56, height 5\' 5"  (1.651 m), weight 207 lb 12.8 oz (94.3 kg), SpO2 98 %.  GEN:  Elderly female,  Moderately obese  HEENT: Normal NECK: No JVD; No carotid bruits LYMPHATICS: No lymphadenopathy CARDIAC: RRR , no murmurs, rubs, gallops RESPIRATORY:  Clear to auscultation without rales, wheezing or rhonchi  ABDOMEN: Soft, non-tender, non-distended MUSCULOSKELETAL:  No edema; No deformity  SKIN: Warm and dry NEUROLOGIC:  Alert and oriented x 3,  Generalized weakness.     ECG:   Assessment / Plan:   1. Intermittent atrial fibrillation:  Cont flecainide and toprol XL 200 mg a day  Cont warfarin     2. Diabetes mellitus-  managed by Dr. Dagmar Hait.   3. Hyperlipidemia-lipids have been managed by Dr. Dagmar Hait.   4. Hypertension Blood pressure is currently well controlled.  Continue current medications.  I encouraged her to continue working on a diet, exercise, weight loss program.  There was some concern that she had some renal artery stenosis.  I have had Dr. Fletcher Anon look at the duplex scan and he does not see any significant renal artery stenosis.  In addition, her blood pressure is on the low side and she is not having any signs or symptoms of CHF.  We will continue to follow for now.  5.  Generalized weakness: Peggy Chen has really not been exercising and she admits that she is just not inclined to exercise.  I have warned her  that as she gets older that the need to exercise regularly to preserve her core strength will be very important.   Mertie Moores, MD  07/19/2020 12:07 PM    [place] [address],  [place], [place]  [address] Pager [phone] Phone: [phone]; Fax: [phone]

## 2020-07-19 ENCOUNTER — Ambulatory Visit (INDEPENDENT_AMBULATORY_CARE_PROVIDER_SITE_OTHER): Payer: Medicare Other | Admitting: Cardiovascular Disease

## 2020-07-19 ENCOUNTER — Other Ambulatory Visit: Payer: Self-pay

## 2020-07-19 ENCOUNTER — Encounter: Payer: Self-pay | Admitting: Cardiovascular Disease

## 2020-07-19 VITALS — BP 96/62 | HR 56 | Ht 65.0 in | Wt 207.8 lb

## 2020-07-19 DIAGNOSIS — I480 Paroxysmal atrial fibrillation: Secondary | ICD-10-CM

## 2020-07-19 DIAGNOSIS — I701 Atherosclerosis of renal artery: Secondary | ICD-10-CM

## 2020-07-19 NOTE — Patient Instructions (Signed)
Medication Instructions:  Your provider recommends that you continue on your current medications as directed. Please refer to the Current Medication list given to you today.   *If you need a refill on your cardiac medications before your next appointment, please call your pharmacy*  Follow-Up: At [facility], you and your health needs are our priority.  As part of our continuing mission to provide you with exceptional heart care, we have created designated Provider Care Teams.  These Care Teams include your primary Cardiologist (physician) and Advanced Practice Providers (APPs -  Physician Assistants and Nurse Practitioners) who all work together to provide you with the care you need, when you need it. Your next appointment:   6 month(s) The format for your next appointment:   In Person Provider:   Scott Weaver, PA-C 

## 2020-08-01 DIAGNOSIS — I480 Paroxysmal atrial fibrillation: Secondary | ICD-10-CM | POA: Diagnosis not present

## 2020-08-01 DIAGNOSIS — Z7901 Long term (current) use of anticoagulants: Secondary | ICD-10-CM | POA: Diagnosis not present

## 2020-08-01 DIAGNOSIS — N1832 Chronic kidney disease, stage 3b: Secondary | ICD-10-CM | POA: Diagnosis not present

## 2020-08-01 DIAGNOSIS — N183 Chronic kidney disease, stage 3 unspecified: Secondary | ICD-10-CM | POA: Diagnosis not present

## 2020-09-03 DIAGNOSIS — I480 Paroxysmal atrial fibrillation: Secondary | ICD-10-CM | POA: Diagnosis not present

## 2020-09-03 DIAGNOSIS — Z7901 Long term (current) use of anticoagulants: Secondary | ICD-10-CM | POA: Diagnosis not present

## 2020-09-03 DIAGNOSIS — Z23 Encounter for immunization: Secondary | ICD-10-CM | POA: Diagnosis not present

## 2020-10-23 DIAGNOSIS — Z23 Encounter for immunization: Secondary | ICD-10-CM | POA: Diagnosis not present

## 2020-11-27 DIAGNOSIS — I480 Paroxysmal atrial fibrillation: Secondary | ICD-10-CM | POA: Diagnosis not present

## 2020-11-27 DIAGNOSIS — Z7901 Long term (current) use of anticoagulants: Secondary | ICD-10-CM | POA: Diagnosis not present

## 2021-03-03 ENCOUNTER — Other Ambulatory Visit: Payer: Self-pay | Admitting: Cardiovascular Disease

## 2021-03-04 MED ORDER — METOPROLOL SUCCINATE ER 200 MG PO TB24: 200.0000 mg | ORAL_TABLET | Freq: Every day | ORAL | 1 refills | Status: DC

## 2021-08-03 ENCOUNTER — Other Ambulatory Visit: Payer: Self-pay | Admitting: Cardiovascular Disease

## 2021-08-26 ENCOUNTER — Other Ambulatory Visit: Payer: Self-pay | Admitting: Cardiovascular Disease

## 2021-09-19 ENCOUNTER — Telehealth: Payer: Self-pay | Admitting: Cardiovascular Disease

## 2021-09-19 MED ORDER — FLECAINIDE ACETATE 150 MG PO TABS: 150.0000 mg | ORAL_TABLET | Freq: Two times a day (BID) | ORAL | 0 refills | Status: DC

## 2021-09-19 NOTE — Telephone Encounter (Signed)
Pt's medication was sent to pt's pharmacy as requested. Confirmation received.  °

## 2021-09-19 NOTE — Telephone Encounter (Signed)
*  STAT* If patient is at the pharmacy, call can be transferred to refill team.   1. Which medications need to be refilled? (please list name of each medication and dose if known) flecainide (TAMBOCOR) 150 MG tablet  2. Which pharmacy/location (including street and city if local pharmacy) is medication to be sent to? flecainide (TAMBOCOR) 150 MG tablet  3. Do they need a 30 day or 90 day supply? [place]

## 2021-10-03 ENCOUNTER — Other Ambulatory Visit: Payer: Self-pay | Admitting: *Deleted

## 2021-10-03 MED ORDER — METOPROLOL SUCCINATE ER 200 MG PO TB24: 200.0000 mg | ORAL_TABLET | Freq: Every day | ORAL | 0 refills | Status: DC

## 2021-10-18 ENCOUNTER — Other Ambulatory Visit: Payer: Self-pay | Admitting: Cardiovascular Disease

## 2021-10-20 ENCOUNTER — Other Ambulatory Visit: Payer: Self-pay | Admitting: *Deleted

## 2021-10-20 MED ORDER — METOPROLOL SUCCINATE ER 200 MG PO TB24: 200.0000 mg | ORAL_TABLET | Freq: Every day | ORAL | 0 refills | Status: DC

## 2021-12-16 ENCOUNTER — Other Ambulatory Visit: Payer: Self-pay

## 2021-12-16 ENCOUNTER — Emergency Department (HOSPITAL_COMMUNITY)
Admission: EM | Admit: 2021-12-16 | Discharge: 2021-12-16 | Disposition: A | Discharge status: Home / Self Care | Payer: Medicare Other | Attending: Emergency Medicine | Admitting: Emergency Medicine

## 2021-12-16 ENCOUNTER — Encounter (HOSPITAL_COMMUNITY): Payer: Self-pay

## 2021-12-16 ENCOUNTER — Emergency Department (HOSPITAL_COMMUNITY): Payer: Medicare Other

## 2021-12-16 DIAGNOSIS — Z7984 Long term (current) use of oral hypoglycemic drugs: Secondary | ICD-10-CM | POA: Diagnosis not present

## 2021-12-16 DIAGNOSIS — S39012A Strain of muscle, fascia and tendon of lower back, initial encounter: Secondary | ICD-10-CM

## 2021-12-16 DIAGNOSIS — I10 Essential (primary) hypertension: Secondary | ICD-10-CM | POA: Insufficient documentation

## 2021-12-16 DIAGNOSIS — E119 Type 2 diabetes mellitus without complications: Secondary | ICD-10-CM | POA: Insufficient documentation

## 2021-12-16 DIAGNOSIS — Z7982 Long term (current) use of aspirin: Secondary | ICD-10-CM | POA: Insufficient documentation

## 2021-12-16 DIAGNOSIS — Z7901 Long term (current) use of anticoagulants: Secondary | ICD-10-CM | POA: Insufficient documentation

## 2021-12-16 DIAGNOSIS — N3000 Acute cystitis without hematuria: Secondary | ICD-10-CM

## 2021-12-16 DIAGNOSIS — X500XXA Overexertion from strenuous movement or load, initial encounter: Secondary | ICD-10-CM | POA: Insufficient documentation

## 2021-12-16 DIAGNOSIS — S3992XA Unspecified injury of lower back, initial encounter: Secondary | ICD-10-CM | POA: Diagnosis present

## 2021-12-16 DIAGNOSIS — J45909 Unspecified asthma, uncomplicated: Secondary | ICD-10-CM | POA: Insufficient documentation

## 2021-12-16 LAB — CBC WITH DIFFERENTIAL/PLATELET
Abs Immature Granulocytes: 0.07 10*3/uL (ref 0.00–0.07)
Basophils Absolute: 0.1 10*3/uL (ref 0.0–0.1)
Basophils Relative: 1 %
Eosinophils Absolute: 0.3 10*3/uL (ref 0.0–0.5)
Eosinophils Relative: 3 %
HCT: 40.8 % (ref 36.0–46.0)
Hemoglobin: 12.9 g/dL (ref 12.0–15.0)
Immature Granulocytes: 1 %
Lymphocytes Relative: 15 %
Lymphs Abs: 1.9 10*3/uL (ref 0.7–4.0)
MCH: 30.4 pg (ref 26.0–34.0)
MCHC: 31.6 g/dL (ref 30.0–36.0)
MCV: 96 fL (ref 80.0–100.0)
Monocytes Absolute: 0.4 10*3/uL (ref 0.1–1.0)
Monocytes Relative: 3 %
Neutro Abs: 10 10*3/uL — ABNORMAL HIGH (ref 1.7–7.7)
Neutrophils Relative %: 77 %
Platelets: 386 10*3/uL (ref 150–400)
RBC: 4.25 MIL/uL (ref 3.87–5.11)
RDW: 13.3 % (ref 11.5–15.5)
WBC: 12.9 10*3/uL — ABNORMAL HIGH (ref 4.0–10.5)
nRBC: 0 % (ref 0.0–0.2)

## 2021-12-16 LAB — URINALYSIS, ROUTINE W REFLEX MICROSCOPIC
Bilirubin Urine: NEGATIVE
Glucose, UA: NEGATIVE mg/dL
Hgb urine dipstick: NEGATIVE
Ketones, ur: NEGATIVE mg/dL
Nitrite: NEGATIVE
Protein, ur: NEGATIVE mg/dL
Specific Gravity, Urine: 1.014 (ref 1.005–1.030)
WBC, UA: >50 WBC/hpf — ABNORMAL HIGH (ref 0–5)
pH: 5 (ref 5.0–8.0)

## 2021-12-16 LAB — BASIC METABOLIC PANEL
Anion gap: 10 (ref 5–15)
BUN: 56 mg/dL — ABNORMAL HIGH (ref 8–23)
CO2: 18 mmol/L — ABNORMAL LOW (ref 22–32)
Calcium: 8.9 mg/dL (ref 8.9–10.3)
Chloride: 109 mmol/L (ref 98–111)
Creatinine, Ser: 1.93 mg/dL — ABNORMAL HIGH (ref 0.44–1.00)
GFR, Estimated: 27 mL/min — ABNORMAL LOW (ref >60)
Glucose, Bld: 203 mg/dL — ABNORMAL HIGH (ref 70–99)
Potassium: 3.4 mmol/L — ABNORMAL LOW (ref 3.5–5.1)
Sodium: 137 mmol/L (ref 135–145)

## 2021-12-16 LAB — PROTIME-INR
INR: 2 — ABNORMAL HIGH (ref 0.8–1.2)
Prothrombin Time: 23.1 seconds — ABNORMAL HIGH (ref 11.4–15.2)

## 2021-12-16 MED ORDER — CEPHALEXIN 500 MG PO CAPS
500.0000 mg | ORAL_CAPSULE | Freq: Once | ORAL | Status: AC
  Administered 2021-12-16: 500 mg via ORAL
  Filled 2021-12-16: qty 1

## 2021-12-16 MED ORDER — ONDANSETRON HCL 4 MG/2ML IJ SOLN
4.0000 mg | Freq: Once | INTRAMUSCULAR | Status: AC
  Administered 2021-12-16: 4 mg via INTRAVENOUS
  Filled 2021-12-16: qty 2

## 2021-12-16 MED ORDER — DIAZEPAM 5 MG PO TABS: 5.0000 mg | ORAL_TABLET | Freq: Two times a day (BID) | ORAL | 0 refills | Status: DC

## 2021-12-16 MED ORDER — SODIUM CHLORIDE 0.9 % IV BOLUS
1000.0000 mL | Freq: Once | INTRAVENOUS | Status: AC
  Administered 2021-12-16: 1000 mL via INTRAVENOUS

## 2021-12-16 MED ORDER — HYDROCODONE-ACETAMINOPHEN 5-325 MG PO TABS: 1.0000 | ORAL_TABLET | ORAL | 0 refills | Status: DC | PRN

## 2021-12-16 MED ORDER — SODIUM CHLORIDE 0.9 % IV BOLUS: 500.0000 mL | Freq: Once | INTRAVENOUS | Status: DC

## 2021-12-16 MED ORDER — MORPHINE SULFATE (PF) 4 MG/ML IV SOLN
4.0000 mg | Freq: Once | INTRAVENOUS | Status: AC
  Administered 2021-12-16: 4 mg via INTRAVENOUS
  Filled 2021-12-16: qty 1

## 2021-12-16 MED ORDER — CEPHALEXIN 500 MG PO CAPS: 500.0000 mg | ORAL_CAPSULE | Freq: Four times a day (QID) | ORAL | 0 refills | Status: DC

## 2021-12-16 MED ORDER — LORAZEPAM 2 MG/ML IJ SOLN
1.0000 mg | Freq: Once | INTRAMUSCULAR | Status: AC
Start: 2021-12-16 — End: 2021-12-16
  Administered 2021-12-16: 1 mg via INTRAVENOUS
  Filled 2021-12-16: qty 1

## 2021-12-16 NOTE — ED Triage Notes (Signed)
Pt BIB EMS with reports of lower back pain since 0100 after trying to put on her socks.

## 2021-12-16 NOTE — ED Provider Notes (Signed)
[facility] DEPT Provider Note   CSN: [id number] Arrival date & time: 12/16/21  0444     History  Chief Complaint  Patient presents with   Back Pain    Peggy Chen is a 71 y.o. female.  Pt is a 71 yo wf with a hx of dm, htn, hyperlipidemia, asthma, depression, kidney stones, and afib on coumadin.  Pt said she bent over last night to put on a sock and felt a severe pain in her back on the right side.  Pt is worried it is her kidney.  She denies any numbness or weakness in her legs.  Pain gets worse with any kind of movement.  Pt denies any n/v.  No sob.  No f/c.      Home Medications Prior to Admission medications   Medication Sig Start Date End Date Taking? Authorizing Provider  cephALEXin (KEFLEX) 500 MG capsule Take 1 capsule (500 mg total) by mouth 4 (four) times daily. 12/16/21  Yes Isla Pence, MD  diazepam (VALIUM) 5 MG tablet Take 1 tablet (5 mg total) by mouth 2 (two) times daily. 12/16/21  Yes Isla Pence, MD  HYDROcodone-acetaminophen (NORCO/VICODIN) 5-325 MG tablet Take 1 tablet by mouth every 4 (four) hours as needed. 12/16/21  Yes Isla Pence, MD  amLODipine (NORVASC) 5 MG tablet Take 5 mg by mouth daily. 08/11/16   [provider]  aspirin 81 MG tablet Take 81 mg by mouth daily.  01/05/11   [provider]  citalopram (CELEXA) 10 MG tablet Take 10 mg by mouth daily.    [provider]  colesevelam (WELCHOL) 625 MG tablet Take 1-3 tablets by mouth as needed. 03/28/20   [provider]  cyanocobalamin 1000 MCG tablet Take 1,000 mcg by mouth daily.     [provider]  flecainide (TAMBOCOR) 150 MG tablet Take 1 tablet (150 mg total) by mouth 2 (two) times daily. Please keep upcoming  appt with Cardiologist in January 2023 before anymore refills. Thank you Final attempt 09/19/21   Nahser, Wonda Cheng, MD  JENTADUETO 2.04-999 MG TABS Take 1 tablet by mouth 2 (two) times daily. 09/02/19    [provider]  metoprolol (TOPROL-XL) 200 MG 24 hr tablet Take 1 tablet (200 mg total) by mouth daily. 10/20/21   Nahser, Wonda Cheng, MD  olmesartan-hydrochlorothiazide (BENICAR HCT) 20-12.5 MG tablet Take 1 tablet by mouth daily. 01/29/20   [provider]  potassium chloride (K-DUR) 10 MEQ tablet Take 1 tablet (10 mEq total) by mouth 2 (two) times daily. 11/07/18 07/19/20  Nahser, Wonda Cheng, MD  warfarin (COUMADIN) 5 MG tablet Take 5-7.5 mg by mouth See admin instructions. 5 mg at bedtime on Sun/Mon/Tues/Thurs/Fri/Sat and 7.5 mg on Wed    [provider]  zolpidem (AMBIEN) 5 MG tablet Take 5 mg by mouth daily. 05/27/20   [provider]      Allergies    Penicillins    Review of Systems   Review of Systems  Musculoskeletal:  Positive for back pain.  All other systems reviewed and are negative.  Physical Exam Updated Vital Signs BP (!) 119/55    Pulse 60    Temp 98.1 F (36.7 C) (Oral)    Resp 16    Ht 5\' 5"  (1.651 m)    Wt 87.1 kg    SpO2 100%    BMI 31.95 kg/m  Physical Exam Vitals and nursing note reviewed.  Constitutional:      Appearance:  Normal appearance. She is obese.  HENT:     Head: Normocephalic and atraumatic.     Right Ear: External ear normal.     Left Ear: External ear normal.     Mouth/Throat:     Mouth: Mucous membranes are moist.     Pharynx: Oropharynx is clear.  Eyes:     Extraocular Movements: Extraocular movements intact.     Conjunctiva/sclera: Conjunctivae normal.     Pupils: Pupils are equal, round, and reactive to light.  Cardiovascular:     Rate and Rhythm: Normal rate and regular rhythm.     Pulses: Normal pulses.     Heart sounds: Normal heart sounds.  Pulmonary:     Effort: Pulmonary effort is normal.     Breath sounds: Normal breath sounds.  Abdominal:     General: Abdomen is flat. Bowel sounds are normal.     Palpations: Abdomen is soft.  Musculoskeletal:       Arms:     Cervical back: Normal range of  motion and neck supple.  Skin:    General: Skin is warm.     Capillary Refill: Capillary refill takes less than 2 seconds.  Neurological:     General: No focal deficit present.     Mental Status: She is alert and oriented to person, place, and time.  Psychiatric:        Mood and Affect: Mood normal.        Behavior: Behavior normal.    ED Results / Procedures / Treatments   Labs (all labs ordered are listed, but only abnormal results are displayed) Labs Reviewed  BASIC METABOLIC PANEL - Abnormal; Notable for the following components:      Result Value   Potassium 3.4 (*)    CO2 18 (*)    Glucose, Bld 203 (*)    BUN 56 (*)    Creatinine, Ser 1.93 (*)    GFR, Estimated 27 (*)    All other components within normal limits  CBC WITH DIFFERENTIAL/PLATELET - Abnormal; Notable for the following components:   WBC 12.9 (*)    Neutro Abs 10.0 (*)    All other components within normal limits  URINALYSIS, ROUTINE W REFLEX MICROSCOPIC - Abnormal; Notable for the following components:   APPearance HAZY (*)    Leukocytes,Ua LARGE (*)    WBC, UA >50 (*)    Bacteria, UA MANY (*)    All other components within normal limits  PROTIME-INR - Abnormal; Notable for the following components:   Prothrombin Time 23.1 (*)    INR 2.0 (*)    All other components within normal limits    EKG None  Radiology CT Renal Stone Study  Result Date: 12/16/2021 CLINICAL DATA:  Low back pain, flank pain EXAM: CT ABDOMEN AND PELVIS WITHOUT CONTRAST TECHNIQUE: Multidetector CT imaging of the abdomen and pelvis was performed following the standard protocol without IV contrast. COMPARISON:  CT abdomen/pelvis 11/25/2017 FINDINGS: Lower chest: The lung bases are clear. The imaged heart is unremarkable. Hepatobiliary: The liver is unremarkable. The gallbladder is surgically absent. There is no biliary ductal dilatation. Pancreas: Unremarkable. Spleen: Unremarkable. Adrenals/Urinary Tract: The adrenals are  unremarkable. A 3.1 cm left upper pole renal cyst is unchanged. A 1.4 cm right renal cyst is not significantly changed. There are no other focal lesions, within the confines of noncontrast technique. There are no stones in either kidney or along the course of either ureter. There is no hydronephrosis or hydroureter. The bladder  is unremarkable. Stomach/Bowel: The stomach is decompressed but grossly unremarkable. There is no evidence of bowel obstruction. Postsurgical changes are again noted in the sigmoid colon without evidence of complication. Scattered descending colonic diverticuli are seen without evidence of acute diverticulitis. Appendix is normal. Multiple ventral abdominal hernias are seen containing multiple loops of small bowel without evidence of obstruction, similar to the prior study. The larger hernia in the right anterior abdominal wall measures 8.0 cm at the neck, increased in size since 2018. The smaller hernia in the midline anterior abdominal wall measures 4.6 cm of the neck, also increased in size. Vascular/Lymphatic: There is calcified atherosclerotic plaque throughout the nonaneurysmal abdominal aorta. There is no abdominal or pelvic lymphadenopathy. Reproductive: Uterus and adnexa are unremarkable. Other: There is no ascites or free air. In addition to the bowel containing hernias described above, there is an additional fat containing ventral hernia at the midline inferior to the midline bowel containing hernia. There is no evidence of incarceration or strangulation. Musculoskeletal: There is no acute osseous abnormality or aggressive osseous lesion. There is grade 1 anterolisthesis of L4 on L5, unchanged. There is mild indentation of the T12 vertebral body favored due to a prominent Schmorl's node, slightly increased since 2018. IMPRESSION: 1. No acute findings in the abdomen or pelvis. No renal stones or hydronephrosis identified. 2. Three ventral abdominal hernias, two of which contain  multiple loops of small bowel without evidence of incarceration or obstruction. The hernia defects have increased in size since 2018. 3. Scattered colonic diverticuli without evidence of acute diverticulitis. 4.  Aortic Atherosclerosis (ICD10-I70.0). Electronically Signed   By: Valetta Mole M.D.   On: 12/16/2021 08:52    Procedures Procedures    Medications Ordered in ED Medications  cephALEXin (KEFLEX) capsule 500 mg (has no administration in time range)  morphine 4 MG/ML injection 4 mg (4 mg Intravenous Given 12/16/21 0854)  ondansetron (ZOFRAN) injection 4 mg (4 mg Intravenous Given 12/16/21 0853)  LORazepam (ATIVAN) injection 1 mg (1 mg Intravenous Given 12/16/21 0854)  sodium chloride 0.9 % bolus 1,000 mL (1,000 mLs Intravenous New Bag/Given 12/16/21 9449)    ED Course/ Medical Decision Making/ A&P                           Medical Decision Making  Pain is much better after meds.  She is able to ambulate to the bathroom.  CT abd pelvis reviewed.  There is nothing acute.    Pt's CKD is slightly worse than prior.  She is given IVFs.  Pt does have a UTI and is treated with keflex.  Pt is stable for d/c.  She is to return if worse.  F/u with pcp.        Final Clinical Impression(s) / ED Diagnoses Final diagnoses:  Strain of lumbar region, initial encounter  Acute cystitis without hematuria    Rx / DC Orders ED Discharge Orders          Ordered    cephALEXin (KEFLEX) 500 MG capsule  4 times daily        12/16/21 1031    HYDROcodone-acetaminophen (NORCO/VICODIN) 5-325 MG tablet  Every 4 hours PRN        12/16/21 1034    diazepam (VALIUM) 5 MG tablet  2 times daily        12/16/21 1034              Isla Pence, MD 12/16/21 1035

## 2021-12-31 ENCOUNTER — Encounter: Payer: Self-pay | Admitting: Nurse Practitioner

## 2021-12-31 ENCOUNTER — Ambulatory Visit: Payer: Medicare Other | Admitting: Nurse Practitioner

## 2021-12-31 ENCOUNTER — Other Ambulatory Visit: Payer: Self-pay

## 2021-12-31 VITALS — BP 128/62 | HR 55 | Ht 65.0 in | Wt 191.0 lb

## 2021-12-31 DIAGNOSIS — Z7901 Long term (current) use of anticoagulants: Secondary | ICD-10-CM

## 2021-12-31 DIAGNOSIS — I480 Paroxysmal atrial fibrillation: Secondary | ICD-10-CM

## 2021-12-31 DIAGNOSIS — E118 Type 2 diabetes mellitus with unspecified complications: Secondary | ICD-10-CM

## 2021-12-31 DIAGNOSIS — I10 Essential (primary) hypertension: Secondary | ICD-10-CM

## 2021-12-31 DIAGNOSIS — Z79899 Other long term (current) drug therapy: Secondary | ICD-10-CM

## 2021-12-31 MED ORDER — FLECAINIDE ACETATE 100 MG PO TABS: 100.0000 mg | ORAL_TABLET | Freq: Two times a day (BID) | ORAL | 0 refills | Status: DC

## 2021-12-31 MED ORDER — FLECAINIDE ACETATE 150 MG PO TABS: 75.0000 mg | ORAL_TABLET | Freq: Two times a day (BID) | ORAL | Status: DC

## 2021-12-31 NOTE — Patient Instructions (Addendum)
Medication Instructions:   DECREASE Flecainide one half tablet by mouth ( 75 mg) twice daily.    *If you need a refill on your cardiac medications before your next appointment, please call your pharmacy*   Lab Work:  TODAY!!!!!! BMET  If you have labs (blood work) drawn today and your tests are completely normal, you will receive your results only by: [place] (if you have MyChart) OR A paper copy in the mail If you have any lab test that is abnormal or we need to change your treatment, we will call you to review the results.   Testing/Procedures:  None ordered.   Follow-Up: At [facility], you and your health needs are our priority.  As part of our continuing mission to provide you with exceptional heart care, we have created designated Provider Care Teams.  These Care Teams include your primary Cardiologist (physician) and Advanced Practice Providers (APPs -  Physician Assistants and Nurse Practitioners) who all work together to provide you with the care you need, when you need it.  We recommend signing up for the patient portal called "MyChart".  Sign up information is provided on this After Visit Summary.  MyChart is used to connect with patients for Virtual Visits (Telemedicine).  Patients are able to view lab/test results, encounter notes, upcoming appointments, etc.  Non-urgent messages can be sent to your provider as well.   To learn more about what you can do with MyChart, go to [email or web address].    Your next appointment:   Nurse visit Two weeks with EKG  The format for your next appointment:   In Person  Provider:   Mertie Moores, MD to be decided.

## 2021-12-31 NOTE — Progress Notes (Signed)
Cardiology Office Note:    Date:  01/01/2022   ID:  Peggy Chen, DOB June 26, 1951, MRN [id number]  PCP:  [name], MD   [facility] Providers Cardiologist:  Mertie Moores, MD     Referring MD: [name], MD   Chief Complaint: annual follow-up of PAF, HTN  History of Present Illness:    Peggy Chen is a 71 y.o. female with a hx of HTN, PAF on chronic anticoagulation and on antiarrhythmic, DM, and hyperlipidemia.   She had an unsuccessful attempt at cardioversion in May 2015. She has been maintaining SR on flecainide with occasional episodes of a fib if she misses a dose of flecainide. She was last seen in August 2021 by Dr. Acie Fredrickson  Today she is here alone for follow-up.  She has occasional palpitations that she thinks is her a fib that occur when she is late or misses a dose of flecainide. She admits that she often times forgets to take the 2nd dose of flecainide at night. She denies chest pain, shortness of breath, lower extremity edema, fatigue, melena, hematuria, hemoptysis, diaphoresis, weakness, presyncope, syncope, orthopnea, and PND.  She was recently in the emergency department for low back pain.  She was given cephalexin for a urinary tract infection as well as pain medication for back pain.  She is followed by Dr. Dagmar Hait for primary care and reports that he has recently told her she is CKD stage III.  She has not been referred to nephrology.  She has a history of hypokalemia but states she only takes potassium supplement for episodes of explosive diarrhea.  She has had a recent eruption of a rash to her chest, upper back and right arm.  She has changed many of her personal care products with no improvement.  She states she is now using a lotion and a powder that is giving her some relief.   Past Medical History:  Diagnosis Date   Anxiety    Arthritis    Asthma    Bronchitis   Atherosclerosis 11/25/2017   noted on CT   Back pain    Depression     Diabetes mellitus    Diverticulosis 11/25/2017   mild, noted on CT   Fatty liver 11/25/2017   noted on CT   First degree AV block    History of kidney stones    Hydronephrosis 11/25/2017   mild left noted on CT   Hyperlipidemia    Hypertension    PAF (paroxysmal atrial fibrillation) (HCC)    Renal cyst 11/2017   Left, noted on [place]   Right renal artery stenosis (HCC)    Sinus bradycardia    Skin cancer    questionable skin cancer on face years ago   UTI (urinary tract infection)     Past Surgical History:  Procedure Laterality Date   BREAST CYST REMOVAL     RIGHT BREAST   CARDIOVERSION N/A 04/17/2014   Procedure: CARDIOVERSION;  Surgeon: Thayer Headings, MD;  Location: [facility] ENDOSCOPY;  Service: Cardiovascular;  Laterality: N/A;   CARDIOVERSION  05/2014   attempted DC failed   COLON SURGERY     May 2000   COLONOSCOPY     CYSTOSCOPY/URETEROSCOPY/HOLMIUM LASER/STENT PLACEMENT Left 11/26/2017   Procedure: CYSTOSCOPY/STENT PLACEMENT;  Surgeon: Raynelle Bring, MD;  Location: [facility] ORS;  Service: Urology;  Laterality: Left;   CYSTOSCOPY/URETEROSCOPY/HOLMIUM LASER/STENT PLACEMENT Left 12/23/2017   Procedure: CYSTOSCOPY/ LEFT URETEROSCOPY;  Surgeon: Raynelle Bring, MD;  Location: [facility] ORS;  Service:  Urology;  Laterality: Left;   TONSILLECTOMY     US ECHOCARDIOGRAPHY  12/30/2009   EF 55-60%    Current Medications: Current Meds  Medication Sig   amLODipine (NORVASC) 5 MG tablet Take 5 mg by mouth daily.   aspirin 81 MG tablet Take 81 mg by mouth daily.    citalopram (CELEXA) 10 MG tablet Take 10 mg by mouth daily.   colesevelam (WELCHOL) 625 MG tablet Take 1-3 tablets by mouth as needed (for colon).   cyanocobalamin 1000 MCG tablet Take 1,000 mcg by mouth daily.    diazepam (VALIUM) 5 MG tablet Take 5 mg by mouth every 6 (six) hours as needed for anxiety.   HYDROcodone-acetaminophen (NORCO/VICODIN) 5-325 MG tablet Take 1 tablet by mouth every 4 (four) hours as needed.   JENTADUETO  2.04-999 MG TABS Take 1 tablet by mouth 2 (two) times daily.   metoprolol (TOPROL-XL) 200 MG 24 hr tablet Take 1 tablet (200 mg total) by mouth daily.   olmesartan-hydrochlorothiazide (BENICAR HCT) 20-12.5 MG tablet Take 1 tablet by mouth daily.   warfarin (COUMADIN) 5 MG tablet Take 5-7.5 mg by mouth See admin instructions. 5 mg at bedtime on Sun/Mon/Tues/Thurs/Fri/Sat and 7.5 mg on Wed   zolpidem (AMBIEN) 5 MG tablet Take 5 mg by mouth daily.   [DISCONTINUED] flecainide (TAMBOCOR) 150 MG tablet Take 1 tablet (150 mg total) by mouth 2 (two) times daily. Please keep upcoming  appt with Cardiologist in January 2023 before anymore refills. Thank you Final attempt     Allergies:   Penicillins   Social History   Socioeconomic History   Marital status: Married    Spouse name: Not on file   Number of children: Not on file   Years of education: Not on file   Highest education level: Not on file  Occupational History   Not on file  Tobacco Use   Smoking status: Former    Types: Cigarettes    Quit date: 06/22/1986    Years since quitting: 35.5   Smokeless tobacco: Never  Vaping Use   Vaping Use: Never used  Substance and Sexual Activity   Alcohol use: Yes    Comment: occasional   Drug use: No   Sexual activity: Not on file  Other Topics Concern   Not on file  Social History Narrative   Not on file   Social Determinants of Health   Financial Resource Strain: Not on file  Food Insecurity: Not on file  Transportation Needs: Not on file  Physical Activity: Not on file  Stress: Not on file  Social Connections: Not on file     Family History: The patient's family history includes COPD in her mother; Heart attack in her father.  ROS:   Please see the history of present illness.    + rash to torso, right arm, upper back All other systems reviewed and are negative.  Labs/Other Studies Reviewed:    The following studies were reviewed today:  Normal myoview 6/15  Echo  4/15  Left ventricle:  The cavity size was normal. Wall thickness  was normal. Systolic function was normal. The estimated  ejection fraction was in the range of 50% to 55%. Wall  motion was normal; there were no regional wall motion  abnormalities. The deceleration time of the early  transmitral flow velocity was normal. The tissue Doppler  parameters were normal.  Aortic valve:   Structurally normal valve. Trileaflet. Cusp  separation was normal.  Doppler:  Transvalvular velocity  was  within the normal range. There was no stenosis.  No  regurgitation.  Aorta: Aortic root: The aortic root was normal in size.  Ascending aorta: The ascending aorta was normal in size.  Mitral valve:   Structurally normal valve.   Leaflet  separation was normal.  Doppler:  Transvalvular velocity was  within the normal range. There was no evidence for stenosis.  Trivial regurgitation.    Peak gradient: 63mm Hg (D).  Left atrium:  The atrium was mildly dilated.  Atrial septum:  No defect or patent foramen ovale was  identified.  Right ventricle:  The cavity size was normal. Wall thickness  was normal. Systolic function was normal.  Pulmonic valve:   Poorly visualized.  The valve appears to  be grossly normal.    Doppler:   No significant  regurgitation.  Tricuspid valve:   Doppler:   Mild regurgitation.  Pulmonary artery:   Systolic pressure was within the normal  range.  Right atrium:  The atrium was at the upper limits of normal  in size.  Pericardium: There was no pericardial effusion.  Systemic veins:  Inferior vena cava: The vessel was normal in size; the  respirophasic diameter changes were in the normal range (=  50%); findings are consistent with normal central venous  pressure.   Recent Labs: 12/16/2021: BUN 56; Creatinine, Ser 1.93; Hemoglobin 12.9; Platelets 386; Potassium 3.4; Sodium 137  Recent Lipid Panel No results found for: CHOL, TRIG, HDL, CHOLHDL, VLDL, LDLCALC,  LDLDIRECT   Risk Assessment/Calculations:    CHA2DS2-VASc Score = 4  This indicates a 4.8% annual risk of stroke. The patient's score is based upon: CHF History: 0 HTN History: 1 Diabetes History: 1 Stroke History: 0 Vascular Disease History: 0 Age Score: 1 Gender Score: 1    Physical Exam:    VS:  BP 128/62 (BP Location: Right Arm)    Pulse (!) 55    Ht 5\' 5"  (1.651 m)    Wt 191 lb (86.6 kg)    SpO2 97%    BMI 31.78 kg/m     Wt Readings from Last 3 Encounters:  12/31/21 191 lb (86.6 kg)  12/16/21 192 lb (87.1 kg)  07/19/20 207 lb 12.8 oz (94.3 kg)     GEN:  Well nourished, well developed in no acute distress HEENT: Normal NECK: No JVD; No carotid bruits CARDIAC: RRR, no murmurs, rubs, gallops RESPIRATORY:  Clear to auscultation without rales, wheezing or rhonchi  ABDOMEN: Soft, non-tender, non-distended MUSCULOSKELETAL:  No edema; No deformity. 2+ pedal pulses, equal bilaterally SKIN: Warm and dry with scattered erythematous lesions to upper back, chest, and right arm without drainage NEUROLOGIC:  Alert and oriented x 3 PSYCHIATRIC:  Normal affect   EKG:  EKG is ordered today.  The ekg ordered today demonstrates sinus bradycardia at rate of 55 bpm, 1st degree AV block, stable QTc. Reviewed with Dr. Gasper Sells, DOD due to recent kidney function results  Diagnoses:    1. PAF (paroxysmal atrial fibrillation) ([place])   2. Long term current use of antiarrhythmic drug   3. Current use of long term anticoagulation   4. Essential hypertension   5. Type 2 diabetes mellitus with complication, without long-term current use of insulin (HCC)    Assessment and Plan:     PAF on long term anticoagulation, long term use of antiarrhythmic drug: She denies bleeding problems on warfarin. INR followed at PCP. She admits to noncompliance with twice daily dosing of flecainide. She has  also had a recent bump in creatinine with questionable kidney or urinary tract infection. EKG is  stable today with QRS 98 ms, stable QTc. Plan discussed with Dr. Gasper Sells, DOD. We will repeat bmet today and reduce the patient's flecainide dose to 75 mg twice daily. Will schedule follow-up EKG in 2 weeks. Advised patient to call back prior to next appointment if she has symptoms of increased palpitations, fluttering, chest discomfort, dyspnea, dizziness, presyncope, syncope or other concerns. Continue warfarin. We will refer her to EP for management of her atrial fibrillation.    Essential hypertension: BP is stable today. She reports BP has been stable at visits with other providers and during recent ED admission. Continue metoprolol, Benicar, amlodipine.   CKD: Diagnosed as Stage 3 by PCP. Recent ED visit reveals basic metabolic panel with creatinine elevated at 1.93, GFR 27. Creatinine clearance calculated using Cockcroft-Gault equation: 36 mL/min. Creatinine 2/22 at primary care office 1.4. We will recheck bmet today. As noted above, due to noncompliance with her flecainide dose and in the setting of reduced kidney function, we will reduce flecainide dose. Continue Benicar.   Diabetes Mellitus: A1C 6.1 on 11/22. Management per PCP.   Hyperlipidemia: LDL 49 on 01/24/21. On Welchol. Management per PCP.    Disposition: EKG in 2 weeks for flecainide therapy. Follow-up TBD after discussion with primary cardiologist. Will refer to EP.     Medication Adjustments/Labs and Tests Ordered: Current medicines are reviewed at length with the patient today.  Concerns regarding medicines are outlined above.  Orders Placed This Encounter  Procedures   Basic Metabolic Panel (BMET)   Ambulatory referral to Cardiac Electrophysiology   EKG 12-Lead   Meds ordered this encounter  Medications   DISCONTD: flecainide (TAMBOCOR) 100 MG tablet    Sig: Take 1 tablet (100 mg total) by mouth 2 (two) times daily.    Dispense:  60 tablet    Refill:  0   flecainide (TAMBOCOR) 150 MG tablet    Sig: Take 0.5  tablets (75 mg total) by mouth 2 (two) times daily.    Patient Instructions  Medication Instructions:   DECREASE Flecainide one half tablet by mouth ( 75 mg) twice daily.    *If you need a refill on your cardiac medications before your next appointment, please call your pharmacy*   Lab Work:  TODAY!!!!!! BMET  If you have labs (blood work) drawn today and your tests are completely normal, you will receive your results only by: Home (if you have MyChart) OR A paper copy in the mail If you have any lab test that is abnormal or we need to change your treatment, we will call you to review the results.   Testing/Procedures:  None ordered.   Follow-Up: At [facility], you and your health needs are our priority.  As part of our continuing mission to provide you with exceptional heart care, we have created designated Provider Care Teams.  These Care Teams include your primary Cardiologist (physician) and Advanced Practice Providers (APPs -  Physician Assistants and Nurse Practitioners) who all work together to provide you with the care you need, when you need it.  We recommend signing up for the patient portal called "MyChart".  Sign up information is provided on this After Visit Summary.  MyChart is used to connect with patients for Virtual Visits (Telemedicine).  Patients are able to view lab/test results, encounter notes, upcoming appointments, etc.  Non-urgent messages can be sent to your provider as well.  To learn more about what you can do with MyChart, go to [email or web address].    Your next appointment:   Nurse visit Two weeks with EKG  The format for your next appointment:   In Person  Provider:   Mertie Moores, MD to be decided.        Signed, Emmaline Life, NP  01/01/2022 8:48 AM    [place]

## 2022-01-01 ENCOUNTER — Encounter: Payer: Self-pay | Admitting: Nurse Practitioner

## 2022-01-01 LAB — BASIC METABOLIC PANEL
BUN/Creatinine Ratio: 25 (ref 12–28)
BUN: 33 mg/dL — ABNORMAL HIGH (ref 8–27)
CO2: 20 mmol/L (ref 20–29)
Calcium: 9.2 mg/dL (ref 8.7–10.3)
Chloride: 103 mmol/L (ref 96–106)
Creatinine, Ser: 1.3 mg/dL — ABNORMAL HIGH (ref 0.57–1.00)
Glucose: 112 mg/dL — ABNORMAL HIGH (ref 70–99)
Potassium: 4 mmol/L (ref 3.5–5.2)
Sodium: 139 mmol/L (ref 134–144)
eGFR: 44 mL/min/{1.73_m2} — ABNORMAL LOW (ref >59)

## 2022-01-14 ENCOUNTER — Ambulatory Visit: Payer: Medicare Other | Admitting: *Deleted

## 2022-01-14 ENCOUNTER — Telehealth: Payer: Self-pay | Admitting: *Deleted

## 2022-01-14 ENCOUNTER — Other Ambulatory Visit: Payer: Self-pay

## 2022-01-14 VITALS — BP 128/62 | HR 56 | Wt 193.0 lb

## 2022-01-14 DIAGNOSIS — I480 Paroxysmal atrial fibrillation: Secondary | ICD-10-CM | POA: Diagnosis not present

## 2022-01-14 NOTE — Progress Notes (Signed)
Reason for visit: EKG  Name of MD requesting visit: Christen Bame, NP   H&P: Patient here for EKG after recent decrease in flecainide dose  ROS related to problem: Patient has no complaints  Assessment and plan per MD: EKG reviewed by Dr Gasper Sells.  Patient to continue current flecainide dose.   Patient is scheduled to see Dr Quentin Ore on 02/17/22.  She has decided she does not want to have ablation done.  Patient is asking if she still needs appointment with Dr Quentin Ore.

## 2022-01-14 NOTE — Telephone Encounter (Signed)
Peggy Bame, NP would like patient to see Dr Quentin Ore as scheduled for rhythm control as she has been on flecainide.  I placed call to patient and left message to call office.

## 2022-01-23 ENCOUNTER — Other Ambulatory Visit: Payer: Self-pay

## 2022-01-23 MED ORDER — METOPROLOL SUCCINATE ER 200 MG PO TB24: 200.0000 mg | ORAL_TABLET | Freq: Every day | ORAL | 3 refills | Status: DC

## 2022-01-26 NOTE — Telephone Encounter (Signed)
I spoke with patient and let her know she should keep scheduled appointment with Dr Quentin Ore

## 2022-02-15 NOTE — Progress Notes (Deleted)
Saw Peggy Chen 12/31/2021 ?AF on flecainide. Failed previous DCCV ?Often forgets second dose of flecainide and feels palpitations ?On warfarin ?Also with kidney dysfunction ? ?PVI + posterior wall ?Echo ? ?

## 2022-02-17 ENCOUNTER — Institutional Professional Consult (permissible substitution): Payer: Medicare Other | Admitting: Cardiology

## 2022-03-02 ENCOUNTER — Other Ambulatory Visit: Payer: Self-pay

## 2022-03-02 MED ORDER — FLECAINIDE ACETATE 150 MG PO TABS: 75.0000 mg | ORAL_TABLET | Freq: Two times a day (BID) | ORAL | 3 refills | Status: DC

## 2022-03-02 NOTE — Telephone Encounter (Signed)
Pt's medication was sent to pt's pharmacy as requested. Confirmation received.  °

## 2022-03-03 ENCOUNTER — Encounter: Payer: Self-pay | Admitting: Cardiology

## 2023-01-14 ENCOUNTER — Encounter: Payer: Self-pay | Admitting: Cardiovascular Disease

## 2023-01-14 NOTE — Progress Notes (Signed)
Peggy Chen Date of Birth  01-12-1951       Peggy Chen [address], [address], [place], [place]  [address]   [place], [place]  [address] [phone]     [phone]   Fax  [phone]    Fax [phone]  Problem List: 1. Intermittent atrial fibrillation 2. Diabetes mellitus 3. Hyperlipidemia 4. Hypertension     72 year old female with a history of intermittent atrial fibrillation. She also has a history of diabetes mellitus, hyperlipidemia, and hypertension. She still occasionally has episodes of A-Fib that last 1-3 days.  They can be as short as 3 hours.  She takes propranolol which seems to help.  She is on coumadin.   She does not exercise much at all.  She actually feels better when she is active.    February 26, 2014:  Still having some atrial fib.  On coumadin.    May 16, 2014: We attempted cardioversion Peggy Chen on Apr 17, 2014.  It was not successful. Since that time, we have started her on flecainide. [name] converted to sinus rhythm with the flecainide.  Sept. 19, 2017:  Has had many life changes Lost her husband Dec. 2015 Daughter is in jail for heroin   She is maintaining NSR Is on flecainide.  Has occasional afib episodes if she misses a dose.  November 07, 2018: Seen back today after 2-year absence. She was seen by Robbie Lis last year .    She has had some ortho surgery since I last saw her   Has been falling  Has fallen 3 times over the past year.  Mostly due lack of balance  Ive suggested she discuss with her primary MD - may need a neuro consult  Has lost lots of strength in her legs.   Feb. 10, 2021  Doing ok .  Occasional palps . Not getting any exercise.   No CP or dyspnea.  Still using a cane for balance.   No further falls this year.  Going for her labs tomorrow for her physical  Wt is 210 lbs.    July 19, 2020:   Peggy Chen is seen today for follow-up visit.  She has  a history of paroxysmal atrial fibrillation.    Dr. Dagmar Hait noticed a slight increase in her creatinine several months ago and ordered a renal artery duplex scan.  This revealed a right renal artery stenosis.  I reviewed the scans with Dr. Fletcher Anon.  He determined that was only a moderate degree of stenosis and can be followed clinically for now.  Her blood pressure has been fairly well controlled.  She does not have any signs or symptoms of CHF.  Lipids look good  Has not been exercising Has lost muscle mass ,  Difficult to stand for long periods of times  We discussed the importance of regular exercise    Feb. 9, 2024 Peggy Chen is seen today for follow up of her PAF , HTN Renal artery stenosis   Had several nights of atrial fib last week  Ate more sweets last week   Wt is 190 lbs  Is not able to work out  Limited by back pain     Current Outpatient Medications on File Prior to Visit  Medication Sig Dispense Refill   amLODipine (NORVASC) 5 MG tablet Take 5 mg by mouth daily.     aspirin 81 MG tablet Take 81 mg by mouth  daily.      citalopram (CELEXA) 10 MG tablet Take 10 mg by mouth daily.     colesevelam (WELCHOL) 625 MG tablet Take 1-3 tablets by mouth as needed (for colon).     cyanocobalamin 1000 MCG tablet Take 1,000 mcg by mouth daily.      flecainide (TAMBOCOR) 150 MG tablet Take 0.5 tablets (75 mg total) by mouth 2 (two) times daily. 90 tablet 3   HYDROcodone-acetaminophen (NORCO/VICODIN) 5-325 MG tablet Take 1 tablet by mouth every 4 (four) hours as needed. 10 tablet 0   JENTADUETO 2.04-999 MG TABS Take 1 tablet by mouth 2 (two) times daily.     metoprolol (TOPROL-XL) 200 MG 24 hr tablet Take 1 tablet (200 mg total) by mouth daily. 90 tablet 3   olmesartan-hydrochlorothiazide (BENICAR HCT) 20-12.5 MG tablet Take 1 tablet by mouth daily.     warfarin (COUMADIN) 5 MG tablet Take 5-7.5 mg by mouth See admin instructions. 5 mg at bedtime on Sun/Mon/Tues/Thurs/Fri/Sat and 7.5 mg on  Wed     zolpidem (AMBIEN) 5 MG tablet Take 5 mg by mouth daily.     No current facility-administered medications on file prior to visit.    Allergies  Allergen Reactions   Penicillins Hives    Has patient had a PCN reaction causing immediate rash, facial/tongue/throat swelling, SOB or lightheadedness with hypotension: Hives Has patient had a PCN reaction causing severe rash involving mucus membranes or skin necrosis: Unk Has patient had a PCN reaction that required hospitalization: No Has patient had a PCN reaction occurring within the last 10 years: No If all of the above answers are "NO", then may proceed with Cephalosporin use.     Past Medical History:  Diagnosis Date   Anxiety    Arthritis    Asthma    Bronchitis   Atherosclerosis 11/25/2017   noted on CT   Back pain    Depression    Diabetes mellitus    Diverticulosis 11/25/2017   mild, noted on CT   Fatty liver 11/25/2017   noted on CT   First degree AV block    History of kidney stones    Hydronephrosis 11/25/2017   mild left noted on CT   Hyperlipidemia    Hypertension    PAF (paroxysmal atrial fibrillation) (HCC)    Renal cyst 11/2017   Left, noted on [place]   Right renal artery stenosis (HCC)    Sinus bradycardia    Skin cancer    questionable skin cancer on face years ago   UTI (urinary tract infection)     Past Surgical History:  Procedure Laterality Date   BREAST CYST REMOVAL     RIGHT BREAST   CARDIOVERSION N/A 04/17/2014   Procedure: CARDIOVERSION;  Surgeon: Thayer Headings, MD;  Location: [facility] ENDOSCOPY;  Service: Cardiovascular;  Laterality: N/A;   CARDIOVERSION  05/2014   attempted DC failed   COLON SURGERY     May 2000   COLONOSCOPY     CYSTOSCOPY/URETEROSCOPY/HOLMIUM LASER/STENT PLACEMENT Left 11/26/2017   Procedure: CYSTOSCOPY/STENT PLACEMENT;  Surgeon: Raynelle Bring, MD;  Location: [facility] ORS;  Service: Urology;  Laterality: Left;   CYSTOSCOPY/URETEROSCOPY/HOLMIUM LASER/STENT PLACEMENT Left  12/23/2017   Procedure: CYSTOSCOPY/ LEFT URETEROSCOPY;  Surgeon: Raynelle Bring, MD;  Location: [facility] ORS;  Service: Urology;  Laterality: Left;   TONSILLECTOMY     US ECHOCARDIOGRAPHY  12/30/2009   EF 55-60%    Social History   Tobacco Use  Smoking Status Former   Types:  Cigarettes   Quit date: 06/22/1986   Years since quitting: 36.5  Smokeless Tobacco Never    Social History   Substance and Sexual Activity  Alcohol Use Yes   Comment: occasional    Family History  Problem Relation Age of Onset   COPD Mother    Heart attack Father     Reviw of Systems:  Reviewed in the HPI.  All other systems are negative.   Physical Exam: Blood pressure 118/66, pulse 73, height 5' 5"$  (1.651 m), weight 190 lb (86.2 kg), SpO2 98 %.     GEN:  moderately obese female in no acute distress HEENT: Normal NECK: No JVD; No carotid bruits LYMPHATICS: No lymphadenopathy CARDIAC: RRR , no murmurs, rubs, gallops RESPIRATORY:  Clear to auscultation without rales, wheezing or rhonchi  ABDOMEN: Soft, non-tender, non-distended MUSCULOSKELETAL:  No edema; No deformity  SKIN: Warm and dry NEUROLOGIC:  Alert and oriented x 3    ECG:   Assessment / Plan:   1. Intermittent atrial fibrillation: Remains in normal sinus rhythm today.     2. Diabetes mellitus-  managed by Dr. Dagmar Hait.   3. Hyperlipidemia-  stable, cont current meds.     4. Hypertension Blood pressure is well-controlled.  Will have her follow-up with Christen Bame, NP in 6 months    5.  Generalized weakness:     Mertie Moores, MD  01/15/2023 3:52 PM    [facility] [place] [address],  [place], [place]  [address] Pager [phone] Phone: [phone]; Fax: [phone]

## 2023-01-15 ENCOUNTER — Encounter: Payer: Self-pay | Admitting: Cardiovascular Disease

## 2023-01-15 ENCOUNTER — Ambulatory Visit: Payer: Medicare Other | Attending: Cardiovascular Disease | Admitting: Cardiovascular Disease

## 2023-01-15 VITALS — BP 118/66 | HR 73 | Ht 65.0 in | Wt 190.0 lb

## 2023-01-15 DIAGNOSIS — I10 Essential (primary) hypertension: Secondary | ICD-10-CM | POA: Diagnosis not present

## 2023-01-15 DIAGNOSIS — I480 Paroxysmal atrial fibrillation: Secondary | ICD-10-CM

## 2023-01-15 NOTE — Patient Instructions (Signed)
Medication Instructions:  Your physician recommends that you continue on your current medications as directed. Please refer to the Current Medication list given to you today.  *If you need a refill on your cardiac medications before your next appointment, please call your pharmacy*   Lab Work: NONE If you have labs (blood work) drawn today and your tests are completely normal, you will receive your results only by: [place] (if you have MyChart) OR A paper copy in the mail If you have any lab test that is abnormal or we need to change your treatment, we will call you to review the results.   Testing/Procedures: NONE   Follow-Up: At [facility], you and your health needs are our priority.  As part of our continuing mission to provide you with exceptional heart care, we have created designated Provider Care Teams.  These Care Teams include your primary Cardiologist (physician) and Advanced Practice Providers (APPs -  Physician Assistants and Nurse Practitioners) who all work together to provide you with the care you need, when you need it.  We recommend signing up for the patient portal called "MyChart".  Sign up information is provided on this After Visit Summary.  MyChart is used to connect with patients for Virtual Visits (Telemedicine).  Patients are able to view lab/test results, encounter notes, upcoming appointments, etc.  Non-urgent messages can be sent to your provider as well.   To learn more about what you can do with MyChart, go to [email or web address].    Your next appointment:   6 month(s)  Provider:   Mertie Moores, MD

## 2023-02-25 ENCOUNTER — Other Ambulatory Visit: Payer: Self-pay | Admitting: Cardiovascular Disease

## 2023-03-23 ENCOUNTER — Other Ambulatory Visit: Payer: Self-pay | Admitting: Cardiovascular Disease

## 2023-07-30 ENCOUNTER — Other Ambulatory Visit: Payer: Self-pay | Admitting: Internal Medicine

## 2023-07-30 DIAGNOSIS — N179 Acute kidney failure, unspecified: Secondary | ICD-10-CM

## 2023-07-30 DIAGNOSIS — N1832 Chronic kidney disease, stage 3b: Secondary | ICD-10-CM

## 2023-08-20 ENCOUNTER — Ambulatory Visit: Payer: Medicare Other | Attending: Nurse Practitioner | Admitting: Nurse Practitioner

## 2023-08-20 NOTE — Progress Notes (Unsigned)
Cardiology Office Note:  .   Date:  08/25/2023  ID:  Peggy Chen, DOB 09-02-1951, MRN [id number] PCP: Chilton Greathouse, MD   [facility] Providers Cardiologist:  Kristeen Miss, MD    Patient Profile: .      PMH Hypertension PAF on chronic anticoagulation and AAD Type 2 DM Hyperlipidemia Hypokalemia CKD  She has been followed by Dr. Elease Hashimoto for management of atrial fibrillation since at least 2012. She had an unsuccessful attempt at cardioversion May 2015.  She has been maintaining sinus rhythm on flecainide with occasional episodes of A-fib if she misses a dose of flecainide. Anticoagulation on coumadin managed by primary care. Her husband passed away December 20, 2014.  She suffered some falls due to lack of balance.   At office visit 07/2020, she reported that Dr. Felipa Eth had noticed a slight increase in her creatinine and ordered a renal duplex which revealed right renal artery stenosis.  Scans were reviewed by Dr. Elease Hashimoto with Dr. Kirke Corin who determined there was only a moderate degree of stenosis that could be followed clinically for now.  BP was well-controlled.  At office visit with me on 12/31/21, she reported non-compliance with twice daily dosing of flecainide. EKG was stable. We reduced her flecainide to 75 mg twice daily and scheduled EKG 2 weeks later which was stable. She did not wish to pursue referral to EP.   Last cardiology clinic visit was 01/15/23 with Dr. Elease Hashimoto. She was limited physically by back pain.  She had several nights of atrial fibrillation the week prior possibly due to eating more sweets. No changes were made to her treatment plan and 6 month follow-up was recommended.        History of Present Illness: .   Peggy Chen is a  72 y.o. female   ROS:        Studies Reviewed: .         Risk Assessment/Calculations:    CHA2DS2-VASc Score = 4   This indicates a 4.8% annual risk of stroke. The patient's score is based upon: CHF History: 0 HTN  History: 1 Diabetes History: 1 Stroke History: 0 Vascular Disease History: 0 Age Score: 1 Gender Score: 1    Physical Exam:   VS:  There were no vitals taken for this visit.   Wt Readings from Last 3 Encounters:  01/15/23 190 lb (86.2 kg)  01/14/22 193 lb (87.5 kg)  12/31/21 191 lb (86.6 kg)    GEN: Well nourished, well developed in no acute distress NECK: No JVD; No carotid bruits CARDIAC: RRR, no murmurs, rubs, gallops RESPIRATORY:  Clear to auscultation without rales, wheezing or rhonchi  ABDOMEN: Soft, non-tender, non-distended EXTREMITIES:  No edema; No deformity     ASSESSMENT AND PLAN: .    PAF on chronic anticoagulation: Continue Coumadin for stroke prevention for CHA2DS2-VASc score of 4.  High risk medication use: Hypertension/Renal artery stenosis:      Dispo:   Signed, Eligha Bridegroom, NP-C This encounter was created in error - please disregard.

## 2023-08-23 ENCOUNTER — Encounter: Payer: Self-pay | Admitting: Nurse Practitioner

## 2023-09-01 ENCOUNTER — Other Ambulatory Visit: Payer: Medicare Other

## 2023-09-07 ENCOUNTER — Inpatient Hospital Stay: Admission: RE | Admit: 2023-09-07 | Payer: Medicare Other | Source: Ambulatory Visit

## 2023-10-30 ENCOUNTER — Emergency Department (HOSPITAL_COMMUNITY): Payer: Medicare Other

## 2023-10-30 ENCOUNTER — Other Ambulatory Visit: Payer: Self-pay

## 2023-10-30 ENCOUNTER — Inpatient Hospital Stay (HOSPITAL_COMMUNITY)
Admission: EM | Admit: 2023-10-30 | Discharge: 2023-11-02 | DRG: 683 | Disposition: A | Discharge status: Home Health | Payer: Medicare Other | Attending: Family Medicine | Admitting: Family Medicine

## 2023-10-30 ENCOUNTER — Encounter (HOSPITAL_COMMUNITY): Payer: Self-pay

## 2023-10-30 DIAGNOSIS — E1159 Type 2 diabetes mellitus with other circulatory complications: Secondary | ICD-10-CM | POA: Diagnosis present

## 2023-10-30 DIAGNOSIS — Z87442 Personal history of urinary calculi: Secondary | ICD-10-CM

## 2023-10-30 DIAGNOSIS — G8929 Other chronic pain: Secondary | ICD-10-CM | POA: Diagnosis present

## 2023-10-30 DIAGNOSIS — Z7901 Long term (current) use of anticoagulants: Secondary | ICD-10-CM

## 2023-10-30 DIAGNOSIS — E119 Type 2 diabetes mellitus without complications: Secondary | ICD-10-CM

## 2023-10-30 DIAGNOSIS — D72829 Elevated white blood cell count, unspecified: Secondary | ICD-10-CM | POA: Diagnosis present

## 2023-10-30 DIAGNOSIS — N1831 Chronic kidney disease, stage 3a: Secondary | ICD-10-CM | POA: Diagnosis present

## 2023-10-30 DIAGNOSIS — E1122 Type 2 diabetes mellitus with diabetic chronic kidney disease: Secondary | ICD-10-CM | POA: Diagnosis present

## 2023-10-30 DIAGNOSIS — I152 Hypertension secondary to endocrine disorders: Secondary | ICD-10-CM | POA: Diagnosis present

## 2023-10-30 DIAGNOSIS — Z87891 Personal history of nicotine dependence: Secondary | ICD-10-CM

## 2023-10-30 DIAGNOSIS — I480 Paroxysmal atrial fibrillation: Secondary | ICD-10-CM | POA: Diagnosis present

## 2023-10-30 DIAGNOSIS — M549 Dorsalgia, unspecified: Secondary | ICD-10-CM | POA: Diagnosis present

## 2023-10-30 DIAGNOSIS — I959 Hypotension, unspecified: Secondary | ICD-10-CM | POA: Diagnosis present

## 2023-10-30 DIAGNOSIS — Z880 Allergy status to penicillin: Secondary | ICD-10-CM

## 2023-10-30 DIAGNOSIS — W010XXA Fall on same level from slipping, tripping and stumbling without subsequent striking against object, initial encounter: Secondary | ICD-10-CM | POA: Diagnosis present

## 2023-10-30 DIAGNOSIS — W19XXXA Unspecified fall, initial encounter: Principal | ICD-10-CM

## 2023-10-30 DIAGNOSIS — Y92009 Unspecified place in unspecified non-institutional (private) residence as the place of occurrence of the external cause: Secondary | ICD-10-CM

## 2023-10-30 DIAGNOSIS — I447 Left bundle-branch block, unspecified: Secondary | ICD-10-CM | POA: Diagnosis present

## 2023-10-30 DIAGNOSIS — E785 Hyperlipidemia, unspecified: Secondary | ICD-10-CM | POA: Diagnosis present

## 2023-10-30 DIAGNOSIS — I440 Atrioventricular block, first degree: Secondary | ICD-10-CM | POA: Diagnosis present

## 2023-10-30 DIAGNOSIS — R296 Repeated falls: Secondary | ICD-10-CM | POA: Diagnosis present

## 2023-10-30 DIAGNOSIS — Z85828 Personal history of other malignant neoplasm of skin: Secondary | ICD-10-CM

## 2023-10-30 DIAGNOSIS — D649 Anemia, unspecified: Secondary | ICD-10-CM | POA: Diagnosis present

## 2023-10-30 DIAGNOSIS — N189 Chronic kidney disease, unspecified: Secondary | ICD-10-CM | POA: Diagnosis present

## 2023-10-30 DIAGNOSIS — I10 Essential (primary) hypertension: Secondary | ICD-10-CM | POA: Diagnosis present

## 2023-10-30 DIAGNOSIS — Z825 Family history of asthma and other chronic lower respiratory diseases: Secondary | ICD-10-CM

## 2023-10-30 DIAGNOSIS — M6282 Rhabdomyolysis: Secondary | ICD-10-CM | POA: Diagnosis present

## 2023-10-30 DIAGNOSIS — Z8249 Family history of ischemic heart disease and other diseases of the circulatory system: Secondary | ICD-10-CM

## 2023-10-30 DIAGNOSIS — E1169 Type 2 diabetes mellitus with other specified complication: Secondary | ICD-10-CM | POA: Diagnosis present

## 2023-10-30 DIAGNOSIS — Z7982 Long term (current) use of aspirin: Secondary | ICD-10-CM

## 2023-10-30 DIAGNOSIS — D689 Coagulation defect, unspecified: Secondary | ICD-10-CM | POA: Diagnosis not present

## 2023-10-30 DIAGNOSIS — Z79899 Other long term (current) drug therapy: Secondary | ICD-10-CM

## 2023-10-30 DIAGNOSIS — N179 Acute kidney failure, unspecified: Secondary | ICD-10-CM | POA: Diagnosis not present

## 2023-10-30 DIAGNOSIS — R791 Abnormal coagulation profile: Secondary | ICD-10-CM | POA: Diagnosis present

## 2023-10-30 DIAGNOSIS — T45515A Adverse effect of anticoagulants, initial encounter: Secondary | ICD-10-CM | POA: Diagnosis present

## 2023-10-30 LAB — COMPREHENSIVE METABOLIC PANEL
ALT: 29 U/L (ref 0–44)
AST: 30 U/L (ref 15–41)
Albumin: 3.5 g/dL (ref 3.5–5.0)
Alkaline Phosphatase: 49 U/L (ref 38–126)
Anion gap: 9 (ref 5–15)
BUN: 44 mg/dL — ABNORMAL HIGH (ref 8–23)
CO2: 27 mmol/L (ref 22–32)
Calcium: 9.3 mg/dL (ref 8.9–10.3)
Chloride: 98 mmol/L (ref 98–111)
Creatinine, Ser: 2.1 mg/dL — ABNORMAL HIGH (ref 0.44–1.00)
GFR, Estimated: 24 mL/min — ABNORMAL LOW (ref >60)
Glucose, Bld: 194 mg/dL — ABNORMAL HIGH (ref 70–99)
Potassium: 4.1 mmol/L (ref 3.5–5.1)
Sodium: 134 mmol/L — ABNORMAL LOW (ref 135–145)
Total Bilirubin: 1.5 mg/dL — ABNORMAL HIGH (ref <1.2)
Total Protein: 6.7 g/dL (ref 6.5–8.1)

## 2023-10-30 LAB — CK: Total CK: 294 U/L — ABNORMAL HIGH (ref 38–234)

## 2023-10-30 LAB — POC OCCULT BLOOD, ED: Fecal Occult Bld: NEGATIVE

## 2023-10-30 LAB — CBC
HCT: 27.7 % — ABNORMAL LOW (ref 36.0–46.0)
Hemoglobin: 8.5 g/dL — ABNORMAL LOW (ref 12.0–15.0)
MCH: 29.4 pg (ref 26.0–34.0)
MCHC: 30.7 g/dL (ref 30.0–36.0)
MCV: 95.8 fL (ref 80.0–100.0)
Platelets: 391 10*3/uL (ref 150–400)
RBC: 2.89 MIL/uL — ABNORMAL LOW (ref 3.87–5.11)
RDW: 14 % (ref 11.5–15.5)
WBC: 15.1 10*3/uL — ABNORMAL HIGH (ref 4.0–10.5)
nRBC: 0 % (ref 0.0–0.2)

## 2023-10-30 LAB — ABO/RH: ABO/RH(D): AB POS

## 2023-10-30 LAB — PROTIME-INR
INR: 6.7 (ref 0.8–1.2)
Prothrombin Time: 58.7 s — ABNORMAL HIGH (ref 11.4–15.2)

## 2023-10-30 LAB — TROPONIN I (HIGH SENSITIVITY): Troponin I (High Sensitivity): 7 ng/L (ref <18)

## 2023-10-30 LAB — I-STAT CHEM 8, ED
BUN: 43 mg/dL — ABNORMAL HIGH (ref 8–23)
Calcium, Ion: 1.19 mmol/L (ref 1.15–1.40)
Chloride: 98 mmol/L (ref 98–111)
Creatinine, Ser: 2.1 mg/dL — ABNORMAL HIGH (ref 0.44–1.00)
Glucose, Bld: 191 mg/dL — ABNORMAL HIGH (ref 70–99)
HCT: 27 % — ABNORMAL LOW (ref 36.0–46.0)
Hemoglobin: 9.2 g/dL — ABNORMAL LOW (ref 12.0–15.0)
Potassium: 4.2 mmol/L (ref 3.5–5.1)
Sodium: 137 mmol/L (ref 135–145)
TCO2: 27 mmol/L (ref 22–32)

## 2023-10-30 LAB — CBG MONITORING, ED: Glucose-Capillary: 243 mg/dL — ABNORMAL HIGH (ref 70–99)

## 2023-10-30 LAB — I-STAT CG4 LACTIC ACID, ED: Lactic Acid, Venous: 1.4 mmol/L (ref 0.5–1.9)

## 2023-10-30 MED ORDER — INSULIN ASPART 100 UNIT/ML IJ SOLN
0.0000 [IU] | Freq: Three times a day (TID) | INTRAMUSCULAR | Status: DC
  Administered 2023-10-31 (×3): 2 [IU] via SUBCUTANEOUS
  Administered 2023-11-01: 1 [IU] via SUBCUTANEOUS
  Administered 2023-11-01 (×2): 2 [IU] via SUBCUTANEOUS
  Administered 2023-11-02: 1 [IU] via SUBCUTANEOUS

## 2023-10-30 MED ORDER — SODIUM CHLORIDE 0.9% FLUSH
3.0000 mL | Freq: Two times a day (BID) | INTRAVENOUS | Status: DC
  Administered 2023-10-30 – 2023-11-01 (×3): 3 mL via INTRAVENOUS

## 2023-10-30 MED ORDER — ZOLPIDEM TARTRATE 5 MG PO TABS
5.0000 mg | ORAL_TABLET | Freq: Every evening | ORAL | Status: DC | PRN
  Administered 2023-10-31 – 2023-11-01 (×3): 5 mg via ORAL
  Filled 2023-10-30 (×3): qty 1

## 2023-10-30 MED ORDER — VITAMIN K1 10 MG/ML IJ SOLN
1.0000 mg | Freq: Once | INTRAVENOUS | Status: AC
  Administered 2023-10-30: 1 mg via INTRAVENOUS
  Filled 2023-10-30: qty 0.1

## 2023-10-30 MED ORDER — SODIUM CHLORIDE 0.9 % IV SOLN
INTRAVENOUS | Status: AC
Start: 2023-10-30 — End: 2023-10-31

## 2023-10-30 MED ORDER — ACETAMINOPHEN 325 MG PO TABS
650.0000 mg | ORAL_TABLET | Freq: Four times a day (QID) | ORAL | Status: DC | PRN
  Administered 2023-10-31 – 2023-11-01 (×3): 650 mg via ORAL
  Filled 2023-10-30 (×3): qty 2

## 2023-10-30 MED ORDER — ACETAMINOPHEN 650 MG RE SUPP: 650.0000 mg | Freq: Four times a day (QID) | RECTAL | Status: DC | PRN

## 2023-10-30 MED ORDER — PROCHLORPERAZINE EDISYLATE 10 MG/2ML IJ SOLN: 10.0000 mg | Freq: Four times a day (QID) | INTRAMUSCULAR | Status: DC | PRN

## 2023-10-30 MED ORDER — FLECAINIDE ACETATE 50 MG PO TABS
75.0000 mg | ORAL_TABLET | Freq: Two times a day (BID) | ORAL | Status: DC
  Administered 2023-10-30 – 2023-11-01 (×5): 75 mg via ORAL
  Filled 2023-10-30 (×6): qty 2

## 2023-10-30 MED ORDER — SENNOSIDES-DOCUSATE SODIUM 8.6-50 MG PO TABS: 1.0000 | ORAL_TABLET | Freq: Every evening | ORAL | Status: DC | PRN

## 2023-10-30 MED ORDER — INSULIN ASPART 100 UNIT/ML IJ SOLN
0.0000 [IU] | Freq: Every day | INTRAMUSCULAR | Status: DC
  Administered 2023-10-30 – 2023-11-01 (×3): 2 [IU] via SUBCUTANEOUS

## 2023-10-30 MED ORDER — HYDROCODONE-ACETAMINOPHEN 5-325 MG PO TABS
1.0000 | ORAL_TABLET | ORAL | Status: DC | PRN
  Administered 2023-10-30 – 2023-11-02 (×13): 1 via ORAL
  Filled 2023-10-30 (×13): qty 1

## 2023-10-30 MED ORDER — SODIUM CHLORIDE 0.9 % IV SOLN: Freq: Once | INTRAVENOUS | Status: DC

## 2023-10-30 MED ORDER — SODIUM CHLORIDE 0.9 % IV BOLUS
500.0000 mL | Freq: Once | INTRAVENOUS | Status: AC
  Administered 2023-10-30: 500 mL via INTRAVENOUS

## 2023-10-30 MED ORDER — DULOXETINE HCL 60 MG PO CPEP
60.0000 mg | ORAL_CAPSULE | Freq: Every day | ORAL | Status: DC
  Administered 2023-10-31 – 2023-11-01 (×2): 60 mg via ORAL
  Filled 2023-10-30 (×3): qty 1

## 2023-10-30 NOTE — H&P (Signed)
History and Physical    Peggy Chen KVQ:[id number] DOB: 06/18/1951 DOA: 10/30/2023  PCP: Chilton Greathouse, MD  Patient coming from: Home  I have personally briefly reviewed patient's old medical records in [facility]  Chief Complaint: Fall at home  HPI: Peggy Chen is a 72 y.o. female with medical history significant for PAF on Coumadin, CKD stage IIIa, T2DM, HTN, HLD, renal artery stenosis who presented to the ED for evaluation after a fall at home.  Patient states that she has had 3 falls in the last week.  She says she lives alone and normally ambulates with use of a cane or rollator.  She says these falls seem to be due to her legs giving out.  1 of these times she was try to navigate through a doorway with her cane when she thinks she lost her balance which caused her to fall.    She says she has not had any lightheadedness or dizziness prior to these falls but does state that she has had some dizziness which occurred after falling.  She denies any chest pain, palpitations, dyspnea, nausea, vomiting, abdominal pain, dysuria.  She denies any head injury or loss of consciousness.  She does report one of these falls resulting in bruising to her left lateral and posterior thorax and the back of her left upper arm.  She otherwise has not seen any obvious bleeding.  After she fell again today she was down on the ground for about 2 hours before EMS arrived.  Per ED triage documentation her BP was 80/40 on their arrival.  Her blood pressure normalized without intervention.  ED Course  Labs/Imaging on admission: I have personally reviewed following labs and imaging studies.  Initial vitals showed BP 129/46, pulse 59, RR 18, temp 97.8 F, SpO2 99% on room air.  Labs showed INR 6.7, sodium 134, potassium 4.1, bicarb 27, BUN 44, creatinine 2.10 (1.25 on 08/19/2023), serum glucose 194, AST 30, ALT 29, alk phos 49, total bilirubin 1.5, WBC 15.1, hemoglobin 8.5 (11.9 on 08/19/2023),  platelets 391,000, CK2 194, lactic acid 1.4, troponin 7.  FOBT negative.  Portable chest x-ray negative for focal consolidation, edema, effusion.  CT head without contrast negative for acute intracranial process.  Left hip x-ray negative for acute fracture or dislocation.  Patient was given 1 mg IV vitamin K and started on normal saline infusion.  The hospitalist service was consulted to admit for further evaluation and management.  Review of Systems: All systems reviewed and are negative except as documented in history of present illness above.   Past Medical History:  Diagnosis Date   Anxiety    Arthritis    Asthma    Bronchitis   Atherosclerosis 11/25/2017   noted on CT   Back pain    Depression    Diabetes mellitus    Diverticulosis 11/25/2017   mild, noted on CT   Fatty liver 11/25/2017   noted on CT   First degree AV block    History of kidney stones    Hydronephrosis 11/25/2017   mild left noted on CT   Hyperlipidemia    Hypertension    PAF (paroxysmal atrial fibrillation) (HCC)    Renal cyst 11/2017   Left, noted on [place]   Right renal artery stenosis (HCC)    Sinus bradycardia    Skin cancer    questionable skin cancer on face years ago   UTI (urinary tract infection)     Past Surgical History:  Procedure Laterality Date   BREAST CYST REMOVAL     RIGHT BREAST   CARDIOVERSION N/A 04/17/2014   Procedure: CARDIOVERSION;  Surgeon: Vesta Mixer, MD;  Location: [facility] ENDOSCOPY;  Service: Cardiovascular;  Laterality: N/A;   CARDIOVERSION  05/2014   attempted DC failed   COLON SURGERY     May 2000   COLONOSCOPY     CYSTOSCOPY/URETEROSCOPY/HOLMIUM LASER/STENT PLACEMENT Left 11/26/2017   Procedure: CYSTOSCOPY/STENT PLACEMENT;  Surgeon: Heloise Purpura, MD;  Location: [facility] ORS;  Service: Urology;  Laterality: Left;   CYSTOSCOPY/URETEROSCOPY/HOLMIUM LASER/STENT PLACEMENT Left 12/23/2017   Procedure: CYSTOSCOPY/ LEFT URETEROSCOPY;  Surgeon: Heloise Purpura, MD;  Location:  [facility] ORS;  Service: Urology;  Laterality: Left;   TONSILLECTOMY     US ECHOCARDIOGRAPHY  12/30/2009   EF 55-60%    Social History:  reports that she quit smoking about 37 years ago. Her smoking use included cigarettes. She has never used smokeless tobacco. She reports current alcohol use. She reports that she does not use drugs.  Allergies  Allergen Reactions   Penicillins Hives    Has patient had a PCN reaction causing immediate rash, facial/tongue/throat swelling, SOB or lightheadedness with hypotension: Hives Has patient had a PCN reaction causing severe rash involving mucus membranes or skin necrosis: Unk Has patient had a PCN reaction that required hospitalization: No Has patient had a PCN reaction occurring within the last 10 years: No If all of the above answers are "NO", then may proceed with Cephalosporin use.     Family History  Problem Relation Age of Onset   COPD Mother    Heart attack Father      Prior to Admission medications   Medication Sig Start Date End Date Taking? Authorizing Provider  amLODipine (NORVASC) 5 MG tablet Take 5 mg by mouth daily. 08/11/16  Yes [provider]  aspirin 81 MG tablet Take 81 mg by mouth daily.  01/05/11  Yes [provider]  colesevelam (WELCHOL) 625 MG tablet Take 1-3 tablets by mouth as needed (for colon). 03/28/20   [provider]  cyanocobalamin 1000 MCG tablet Take 1,000 mcg by mouth daily.     [provider]  flecainide (TAMBOCOR) 150 MG tablet TAKE 1/2 TABLET (75 MG TOTAL) BY MOUTH TWICE A DAY 02/25/23   Nahser, Deloris Ping, MD  HYDROcodone-acetaminophen (NORCO/VICODIN) 5-325 MG tablet Take 1 tablet by mouth every 4 (four) hours as needed. 12/16/21   Jacalyn Lefevre, MD  JENTADUETO 2.04-999 MG TABS Take 1 tablet by mouth 2 (two) times daily. 09/02/19   [provider]  metoprolol (TOPROL-XL) 200 MG 24 hr tablet Take 1 tablet by mouth once daily 03/24/23   Nahser, Deloris Ping, MD   olmesartan-hydrochlorothiazide (BENICAR HCT) 20-12.5 MG tablet Take 1 tablet by mouth daily. 01/29/20   [provider]  warfarin (COUMADIN) 5 MG tablet Take 5-7.5 mg by mouth See admin instructions. 5 mg at bedtime on Sun/Mon/Tues/Thurs/Fri/Sat and 7.5 mg on Wed    [provider]  zolpidem (AMBIEN) 5 MG tablet Take 5 mg by mouth daily. 05/27/20   [provider]    Physical Exam: Vitals:   10/30/23 2030 10/30/23 2045 10/30/23 2100 10/30/23 2115  BP:      Pulse: (!) 57 (!) 58 (!) 57 (!) 57  Resp: 15 10 13 18   Temp:      TempSrc:      SpO2: 98% 98% 99% 100%   Constitutional: Resting in bed, NAD, calm, comfortable Eyes: EOMI, lids and  conjunctivae normal ENMT: Mucous membranes are moist. Posterior pharynx clear of any exudate or lesions.Normal dentition.  Neck: normal, supple, no masses. Respiratory: clear to auscultation bilaterally, no wheezing, no crackles. Normal respiratory effort. No accessory muscle use.  Cardiovascular: Regular rate and rhythm, no murmurs / rubs / gallops. No extremity edema. 2+ pedal pulses. Abdomen: no tenderness, no masses palpated.  Musculoskeletal: no clubbing / cyanosis. No joint deformity upper and lower extremities. Good ROM, no contractures. Normal muscle tone.  Skin: Ecchymosis to left lateral/posterior thorax and posterior left upper arm Neurologic: Sensation intact. Strength 5/5 in all 4.  Psychiatric: Normal judgment and insight. Alert and oriented x 3. Normal mood.   EKG: Personally reviewed. Sinus rhythm, rate 59, incomplete LBBB, first-degree AV block, low voltage, QTc 587.  QTc is more prolonged when compared to prior.  Assessment/Plan Principal Problem:   Acute kidney injury superimposed on chronic kidney disease (HCC) Active Problems:   Paroxysmal atrial fibrillation (HCC)   Supratherapeutic INR   Normocytic anemia   Leukocytosis   Fall at home, initial encounter   Type 2 diabetes mellitus (HCC)    Hypertension associated with diabetes (HCC)   Peggy Chen is a 72 y.o. female with medical history significant for PAF on Coumadin, CKD stage IIIa, T2DM, HTN, HLD, renal artery stenosis who presented after a fall at home found to have AKI on CKD stage IIIa, acute on chronic anemia, and supratherapeutic INR.  Assessment and Plan: Acute kidney injury superimposed on CKD stage IIIa: Creatinine 2.10 on admission compared to previous 1.25 08/19/2023.  Renal function has been variable on review of prior labs it appears to have baseline CKD stage IIIa-b.  Suspect secondary to volume depletion, medication effect, and potentially early developing rhabdomyolysis.  CK was only mildly elevated at 294. -Continue IV normal saline hydration overnight -Hold metformin, olmesartan, hydrochlorothiazide -Monitor UOP, repeat labs in AM  Paroxysmal atrial fibrillation on chronic Coumadin Supratherapeutic INR: Remains in sinus rhythm with controlled rate.  INR is 6.7.  Has ecchymosis to the left lateral/posterior thorax and posterior upper arm but no obvious bleeding. -S/p 1 mg IV vitamin K given in the ED -Holding Coumadin -Recheck INR in a.m. -Continue flecainide -Holding Toprol-XL for now given hypotension PTA  Normocytic anemia: Hemoglobin down to 8.5 compared to previous 11.9 on 08/19/2023.  INR supratherapeutic as above but patient denies obvious bleeding.  FOBT is negative. -Holding Coumadin, repeat labs in a.m.  Leukocytosis: WBC 15.1 on admit.  No obvious infection.  Suspect reactive in setting of large ecchymosis.  Fall at home: History suggestive of mechanical falls.  Ecchymosis to the left lateral thorax and posterior left upper arm otherwise no significant injury noted.  No LOC.  Lives alone and usually ambulates with cane or rollator.  Head CT, left hip x-ray, and CXR negative for traumatic injury. -PT/OT eval, fall precautions  Type 2 diabetes: Holding home meds and placed on  SSI.  Hypertension: Holding Toprol-XL, olmesartan, HCTZ as above.  Chronic back pain: Continue Cymbalta and home Norco as needed with hold parameters.   DVT prophylaxis: SCDs Start: 10/30/23 2234 Code Status: Full code, confirmed with patient on admission Family Communication: Discussed with patient, no family present on admit Disposition Plan: From home, dispo pending clinical progress Consults called: None Severity of Illness: The appropriate patient status for this patient is OBSERVATION. Observation status is judged to be reasonable and necessary in order to provide the required intensity of service to ensure the patient's safety. The patient's  presenting symptoms, physical exam findings, and initial radiographic and laboratory data in the context of their medical condition is felt to place them at decreased risk for further clinical deterioration. Furthermore, it is anticipated that the patient will be medically stable for discharge from the hospital within 2 midnights of admission.   Darreld Mclean MD [facility]  If 7PM-7AM, please contact night-coverage [email or web address]  10/30/2023, 10:44 PM

## 2023-10-30 NOTE — ED Provider Notes (Signed)
EMERGENCY DEPARTMENT AT [facility] Provider Note   CSN: [id number] Arrival date & time: 10/30/23  1900     History  Chief Complaint  Patient presents with   Peggy Chen Peggy Chen is a 72 y.o. female.  72 year old female with prior medical history as detailed below presents for evaluation.  Patient reports increasing weakness over the last several days.  Patient reports several falls.  She had another fall this afternoon and was unable to stand secondary to weakness in both lower extremities.  She does not think that she struck her head.  She denies LOC.  She is on Coumadin for history of A-fib.  She denies chest pain or shortness of breath.  She denies bleeding.  EMS noted hypotension initially with a blood pressure of 80/40.  This improved without specific treatment.  Patient lives at home by herself.  She was able to contact her neighbor this afternoon after the most recent fall.  She was on the floor for approximately 2 hours before she could get help.  The neighbor called 911.  The history is provided by the patient and medical records.       Home Medications Prior to Admission medications   Medication Sig Start Date End Date Taking? Authorizing Provider  amLODipine (NORVASC) 5 MG tablet Take 5 mg by mouth daily. 08/11/16   [provider]  aspirin 81 MG tablet Take 81 mg by mouth daily.  01/05/11   [provider]  citalopram (CELEXA) 10 MG tablet Take 10 mg by mouth daily.    [provider]  colesevelam (WELCHOL) 625 MG tablet Take 1-3 tablets by mouth as needed (for colon). 03/28/20   [provider]  cyanocobalamin 1000 MCG tablet Take 1,000 mcg by mouth daily.     [provider]  flecainide (TAMBOCOR) 150 MG tablet TAKE 1/2 TABLET (75 MG TOTAL) BY MOUTH TWICE A DAY 02/25/23   Nahser, Deloris Ping, MD  HYDROcodone-acetaminophen (NORCO/VICODIN) 5-325 MG tablet Take 1 tablet by mouth every 4 (four) hours as  needed. 12/16/21   Jacalyn Lefevre, MD  JENTADUETO 2.04-999 MG TABS Take 1 tablet by mouth 2 (two) times daily. 09/02/19   [provider]  metoprolol (TOPROL-XL) 200 MG 24 hr tablet Take 1 tablet by mouth once daily 03/24/23   Nahser, Deloris Ping, MD  olmesartan-hydrochlorothiazide (BENICAR HCT) 20-12.5 MG tablet Take 1 tablet by mouth daily. 01/29/20   [provider]  warfarin (COUMADIN) 5 MG tablet Take 5-7.5 mg by mouth See admin instructions. 5 mg at bedtime on Sun/Mon/Tues/Thurs/Fri/Sat and 7.5 mg on Wed    [provider]  zolpidem (AMBIEN) 5 MG tablet Take 5 mg by mouth daily. 05/27/20   [provider]      Allergies    Penicillins    Review of Systems   Review of Systems  All other systems reviewed and are negative.   Physical Exam Updated Vital Signs BP (!) 129/46 (BP Location: Left Arm)   Pulse (!) 59   Temp 97.8 F (36.6 C) (Oral)   Resp 18   SpO2 99%  Physical Exam Vitals and nursing note reviewed.  Constitutional:      General: She is not in acute distress.    Appearance: Normal appearance. She is well-developed.  HENT:     Head: Normocephalic and atraumatic.  Eyes:     Conjunctiva/sclera: Conjunctivae normal.     Pupils: Pupils are equal, round, and reactive to  light.  Cardiovascular:     Rate and Rhythm: Normal rate and regular rhythm.     Heart sounds: Normal heart sounds.  Pulmonary:     Effort: Pulmonary effort is normal. No respiratory distress.     Breath sounds: Normal breath sounds.  Abdominal:     General: There is no distension.     Palpations: Abdomen is soft.     Tenderness: There is no abdominal tenderness.  Musculoskeletal:        General: No deformity. Normal range of motion.     Cervical back: Normal range of motion and neck supple.  Skin:    General: Skin is warm and dry.     Comments: Ecchymosis to left posterior thorax - no step off, no crepitus  Neurological:     General: No focal deficit present.      Mental Status: She is alert and oriented to person, place, and time.     ED Results / Procedures / Treatments   Labs (all labs ordered are listed, but only abnormal results are displayed) Labs Reviewed  CBC  URINALYSIS, ROUTINE W REFLEX MICROSCOPIC  CK  COMPREHENSIVE METABOLIC PANEL  PROTIME-INR  CBG MONITORING, ED  I-STAT CHEM 8, ED  I-STAT CG4 LACTIC ACID, ED  TYPE AND SCREEN  TROPONIN I (HIGH SENSITIVITY)    EKG EKG Interpretation Date/Time:  Saturday October 30 2023 19:11:46 EST Ventricular Rate:  59 PR Interval:  252 QRS Duration:  111 QT Interval:  592 QTC Calculation: 587 R Axis:   8  Text Interpretation: Sinus rhythm Prolonged PR interval Incomplete left bundle branch block Low voltage, precordial leads Prolonged QT interval Confirmed by Kristine Royal [phone]) on 10/30/2023 7:12:52 PM  Radiology No results found.  Procedures Procedures    Medications Ordered in ED Medications - No data to display  ED Course/ Medical Decision Making/ A&P                                 Medical Decision Making Amount and/or Complexity of Data Reviewed Labs: ordered. Radiology: ordered.  Risk Prescription drug management.    Medical Screen Complete  This patient presented to the ED with complaint of weakness, fall.  This complaint involves an extensive number of treatment options. The initial differential diagnosis includes, but is not limited to, trauma from fall, metabolic abnormality, coagulopathy, etc.   This presentation is: Acute, Self-Limited, Previously Undiagnosed, Uncertain Prognosis, Complicated, Systemic Symptoms, and Threat to Life/Bodily Function  Patient is presenting after fall with complaint of increasing diffuse weakness.  Patient was notably hypotensive transiently with EMS.  BP is improved on arrival.  Notable findings on workup include coagulopathy with INR of 6.7.  Hemoglobin today is 8.5.  No clear evidence of acute bleeding.  Hemoccult  is negative.  Vitamin K administered.  Creatinine today is 2.1 with a BUN of 43.  Baseline creatinine appears to be up approximate 1.2 with last chemistries obtained at [facility] in September 2024.  CK is 294.  CT head, plain films of chest and left hip are without acute abnormality.  Given evidence of coagulopathy, anemia, AKI patient would benefit from admission.  Hospital service made aware of case and evaluate for admission.   Additional history obtained:  External records from outside sources obtained and reviewed including prior ED visits and prior Inpatient records.    Lab Tests:  I ordered and personally interpreted labs.  The pertinent  results include: INR, CMP, CK, troponin, CBC   Imaging Studies ordered:  I ordered imaging studies including CT head, chest x-ray, plain films of the left hip and pelvis  I independently visualized and interpreted obtained imaging which showed NAD I agree with the radiologist interpretation.   Cardiac Monitoring:  The patient was maintained on a cardiac monitor.  I personally viewed and interpreted the cardiac monitor which showed an underlying rhythm of: NSR   Medicines ordered:  I ordered medication including Vit K, NS  for  coagulopathy Reevaluation of the patient after these medicines showed that the patient: improved    Problem List / ED Course:  Fall, weakness, coagulopathy, AKI, anemia   Reevaluation:  After the interventions noted above, I reevaluated the patient and found that they have: improved   Disposition:  After consideration of the diagnostic results and the patients response to treatment, I feel that the patent would benefit from admission.          Final Clinical Impression(s) / ED Diagnoses Final diagnoses:  Fall, initial encounter  Coagulopathy (HCC)  Anemia, unspecified type  AKI (acute kidney injury) [facility])    Rx / DC Orders ED Discharge Orders     None         Wynetta Fines, MD 10/30/23 2127

## 2023-10-30 NOTE — ED Notes (Signed)
ED TO INPATIENT HANDOFF REPORT  ED Nurse Name and Phone #: Gillis Ends [phone]  S Name/Age/Gender Peggy Chen 72 y.o. female Room/Bed: 041C/041C  Code Status   Code Status: Prior  Home/SNF/Other Home Patient oriented to: self, place, time, and situation Is this baseline? Yes   Triage Complete: Triage complete  Chief Complaint Acute kidney injury superimposed on chronic kidney disease (HCC) [N17.9, N18.9]  Triage Note Pt is coming from home, she has been having repeated falls over the past couple days, she was down after a fall for 2hrs today, Her bp was 80/40 when medic arrived, got her up and her blood pressure came up. She is on warfarin. She has had 3 falls in the last week. No head inury, No LOC, complaints of right rib pain that radiates to the back. No C/T/L spine pain. Impaired mobility due to othro problems. She had some dizziness on scene but no longer dizzy.   Medic Vitals  123/93 61hr 97%ra 309bgl     Allergies Allergies  Allergen Reactions   Penicillins Hives    Has patient had a PCN reaction causing immediate rash, facial/tongue/throat swelling, SOB or lightheadedness with hypotension: Hives Has patient had a PCN reaction causing severe rash involving mucus membranes or skin necrosis: Unk Has patient had a PCN reaction that required hospitalization: No Has patient had a PCN reaction occurring within the last 10 years: No If all of the above answers are "NO", then may proceed with Cephalosporin use.     Level of Care/Admitting Diagnosis ED Disposition     ED Disposition  Admit   Condition  --   Comment  Hospital Area: [facility] [[id number]]  Level of Care: Telemetry Cardiac [[id number]]  May place patient in observation at [facility] or [facility] if equivalent level of care is available:: No  Covid Evaluation: Asymptomatic - no recent exposure (last 10 days) testing not required  Diagnosis: Acute kidney injury superimposed on chronic  kidney disease [facility]) [[id number]]  Admitting Physician: Charlsie Quest [[id number]]  Attending Physician: Charlsie Quest [[id number]]          B Medical/Surgery History Past Medical History:  Diagnosis Date   Anxiety    Arthritis    Asthma    Bronchitis   Atherosclerosis 11/25/2017   noted on CT   Back pain    Depression    Diabetes mellitus    Diverticulosis 11/25/2017   mild, noted on CT   Fatty liver 11/25/2017   noted on CT   First degree AV block    History of kidney stones    Hydronephrosis 11/25/2017   mild left noted on CT   Hyperlipidemia    Hypertension    PAF (paroxysmal atrial fibrillation) (HCC)    Renal cyst 11/2017   Left, noted on [place]   Right renal artery stenosis (HCC)    Sinus bradycardia    Skin cancer    questionable skin cancer on face years ago   UTI (urinary tract infection)    Past Surgical History:  Procedure Laterality Date   BREAST CYST REMOVAL     RIGHT BREAST   CARDIOVERSION N/A 04/17/2014   Procedure: CARDIOVERSION;  Surgeon: Vesta Mixer, MD;  Location: [facility] ENDOSCOPY;  Service: Cardiovascular;  Laterality: N/A;   CARDIOVERSION  05/2014   attempted DC failed   COLON SURGERY     May 2000   COLONOSCOPY     CYSTOSCOPY/URETEROSCOPY/HOLMIUM LASER/STENT PLACEMENT Left 11/26/2017   Procedure: CYSTOSCOPY/STENT  PLACEMENT;  Surgeon: Heloise Purpura, MD;  Location: [facility] ORS;  Service: Urology;  Laterality: Left;   CYSTOSCOPY/URETEROSCOPY/HOLMIUM LASER/STENT PLACEMENT Left 12/23/2017   Procedure: CYSTOSCOPY/ LEFT URETEROSCOPY;  Surgeon: Heloise Purpura, MD;  Location: [facility] ORS;  Service: Urology;  Laterality: Left;   TONSILLECTOMY     US ECHOCARDIOGRAPHY  12/30/2009   EF 55-60%     A IV Location/Drains/Wounds Patient Lines/Drains/Airways Status     Active Line/Drains/Airways     Name Placement date Placement time Site Days   Peripheral IV 10/30/23 20 G Anterior;Right;Upper Arm 10/30/23  1938  Arm  less than 1            Intake/Output Last  24 hours No intake or output data in the 24 hours ending 10/30/23 2232  Labs/Imaging Results for orders placed or performed during the hospital encounter of 10/30/23 (from the past 48 hour(s))  CBC     Status: Abnormal   Collection Time: 10/30/23  7:07 PM  Result Value Ref Range   WBC 15.1 (H) 4.0 - 10.5 K/uL   RBC 2.89 (L) 3.87 - 5.11 MIL/uL   Hemoglobin 8.5 (L) 12.0 - 15.0 g/dL   HCT 82.9 (L) 56.2 - 13.0 %   MCV 95.8 80.0 - 100.0 fL   MCH 29.4 26.0 - 34.0 pg   MCHC 30.7 30.0 - 36.0 g/dL   RDW 86.5 78.4 - 69.6 %   Platelets 391 150 - 400 K/uL   nRBC 0.0 0.0 - 0.2 %    Comment: Performed at [facility], [address] [place], [place] [address]  CK     Status: Abnormal   Collection Time: 10/30/23  7:07 PM  Result Value Ref Range   Total CK 294 (H) 38 - 234 U/L    Comment: Performed at [facility], [address] [place], [place] [address]  Troponin I (High Sensitivity)     Status: None   Collection Time: 10/30/23  7:07 PM  Result Value Ref Range   Troponin I (High Sensitivity) 7 <18 ng/L    Comment: (NOTE) Elevated high sensitivity troponin I (hsTnI) values and significant  changes across serial measurements may suggest ACS but many other  chronic and acute conditions are known to elevate hsTnI results.  Refer to the "Links" section for chest pain algorithms and additional  guidance. Performed at [facility], [address] [place], [place] [address]   Comprehensive metabolic panel     Status: Abnormal   Collection Time: 10/30/23  7:13 PM  Result Value Ref Range   Sodium 134 (L) 135 - 145 mmol/L   Potassium 4.1 3.5 - 5.1 mmol/L   Chloride 98 98 - 111 mmol/L   CO2 27 22 - 32 mmol/L   Glucose, Bld 194 (H) 70 - 99 mg/dL    Comment: Glucose reference range applies only to samples taken after fasting for at least 8 hours.   BUN 44 (H) 8 - 23 mg/dL   Creatinine, Ser 7.25 (H) 0.44 - 1.00 mg/dL   Calcium 9.3 8.9 - 36.6 mg/dL   Total Protein 6.7 6.5 -  8.1 g/dL   Albumin 3.5 3.5 - 5.0 g/dL   AST 30 15 - 41 U/L   ALT 29 0 - 44 U/L   Alkaline Phosphatase 49 38 - 126 U/L   Total Bilirubin 1.5 (H) <1.2 mg/dL   GFR, Estimated 24 (L) >60 mL/min    Comment: (NOTE) Calculated using the CKD-EPI Creatinine Equation (  2021)    Anion gap 9 5 - 15    Comment: Performed at [facility], [address], [place], [place] [address]  Protime-INR     Status: Abnormal   Collection Time: 10/30/23  7:13 PM  Result Value Ref Range   Prothrombin Time 58.7 (H) 11.4 - 15.2 seconds   INR 6.7 (HH) 0.8 - 1.2    Comment: REPEATED TO VERIFY CRITICAL RESULT CALLED TO, READ BACK BY AND VERIFIED WITH: Called to Cloyde Reams, RN @2035  10/30/2023 by S.Stanley (NOTE) INR goal varies based on device and disease states. Performed at [facility], [address], [place], [place] [address]   Type and screen [facility]     Status: None   Collection Time: 10/30/23  7:34 PM  Result Value Ref Range   ABO/RH(D) AB POS    Antibody Screen NEG    Sample Expiration      11/02/2023,2359 Performed at [facility], [address], [place], [place] [address]   I-stat chem 8, ED     Status: Abnormal   Collection Time: 10/30/23  7:41 PM  Result Value Ref Range   Sodium 137 135 - 145 mmol/L   Potassium 4.2 3.5 - 5.1 mmol/L   Chloride 98 98 - 111 mmol/L   BUN 43 (H) 8 - 23 mg/dL   Creatinine, Ser 9.14 (H) 0.44 - 1.00 mg/dL   Glucose, Bld 782 (H) 70 - 99 mg/dL    Comment: Glucose reference range applies only to samples taken after fasting for at least 8 hours.   Calcium, Ion 1.19 1.15 - 1.40 mmol/L   TCO2 27 22 - 32 mmol/L   Hemoglobin 9.2 (L) 12.0 - 15.0 g/dL   HCT 95.6 (L) 21.3 - 08.6 %  I-Stat Lactic Acid     Status: None   Collection Time: 10/30/23  7:41 PM  Result Value Ref Range   Lactic Acid, Venous 1.4 0.5 - 1.9 mmol/L  ABO/Rh     Status: None   Collection Time: 10/30/23  7:56 PM  Result Value Ref Range   ABO/RH(D)      AB  POS Performed at [facility], [address], [place], [place] [address]   POC occult blood, ED     Status: None   Collection Time: 10/30/23  8:50 PM  Result Value Ref Range   Fecal Occult Bld NEGATIVE NEGATIVE   DG Hip Unilat W or Wo Pelvis 2-3 Views Left  Result Date: 10/30/2023 CLINICAL DATA:  Fall with left leg pain EXAM: DG HIP (WITH OR WITHOUT PELVIS) 2-3V LEFT COMPARISON:  11/24/2017 FINDINGS: No acute fracture or dislocation. Mild degenerative changes pubic symphysis, both hips, SI joints and lower lumbar spine. IMPRESSION: No acute fracture or dislocation. Electronically Signed   By: Minerva Fester M.D.   On: 10/30/2023 21:05   CT Head Wo Contrast  Result Date: 10/30/2023 CLINICAL DATA:  Repeated falls, on blood thinners EXAM: CT HEAD WITHOUT CONTRAST TECHNIQUE: Contiguous axial images were obtained from the base of the skull through the vertex without intravenous contrast. RADIATION DOSE REDUCTION: This exam was performed according to the departmental dose-optimization program which includes automated exposure control, adjustment of the mA and/or kV according to patient size and/or use of iterative reconstruction technique. COMPARISON:  08/09/2018 FINDINGS: Brain: No evidence of acute infarction, hemorrhage, mass, mass effect, or midline shift. No hydrocephalus or extra-axial fluid collection. Periventricular white matter changes, likely the sequela  of chronic small vessel ischemic disease. Age related cerebral atrophy. 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 Dilated perivascular spaces versus lacunar infarcts in the right basal ganglia Vascular: No hyperdense vessel. Skull: Negative for fracture or focal lesion. Sinuses/Orbits: No acute finding. Status post bilateral lens replacements. Other: The mastoid air cells are well aerated. IMPRESSION: No acute intracranial process. Electronically Signed   By: Wiliam Ke M.D.   On: 10/30/2023 19:56   DG Chest Portable 1  View  Result Date: 10/30/2023 CLINICAL DATA:  Fall, rib pain EXAM: PORTABLE CHEST 1 VIEW COMPARISON:  11/24/2017 FINDINGS: Lungs are clear. No pneumothorax or pleural effusion. Cardiac size within normal limits. Pulmonary vascularity is normal. No acute bone abnormality. IMPRESSION: 1. No active disease. Electronically Signed   By: Helyn Numbers M.D.   On: 10/30/2023 19:31    Pending Labs Unresulted Labs (From admission, onward)     Start     Ordered   10/30/23 1907  Urinalysis, Routine w reflex microscopic -Urine, Clean Catch  Once,   URGENT       Question:  Specimen Source  Answer:  Urine, Clean Catch   10/30/23 1907            Vitals/Pain Today's Vitals   10/30/23 2030 10/30/23 2045 10/30/23 2100 10/30/23 2115  BP:      Pulse: (!) 57 (!) 58 (!) 57 (!) 57  Resp: 15 10 13 18   Temp:      TempSrc:      SpO2: 98% 98% 99% 100%  PainSc:        Isolation Precautions No active isolations  Medications Medications  0.9 %  sodium chloride infusion (has no administration in time range)  phytonadione (VITAMIN K) 1 mg in dextrose 5 % 50 mL IVPB (1 mg Intravenous New Bag/Given 10/30/23 2121)    Mobility non-ambulatory     Focused Assessments Cardiac Assessment Handoff:    Lab Results  Component Value Date   CKTOTAL 294 (H) 10/30/2023   No results found for: "DDIMER" Does the Patient currently have chest pain? No    R Recommendations: See Admitting Provider Note  Report given to:   Additional Notes:

## 2023-10-30 NOTE — ED Triage Notes (Signed)
Pt is coming from home, she has been having repeated falls over the past couple days, she was down after a fall for 2hrs today, Her bp was 80/40 when medic arrived, got her up and her blood pressure came up. She is on warfarin. She has had 3 falls in the last week. No head inury, No LOC, complaints of right rib pain that radiates to the back. No C/T/L spine pain. Impaired mobility due to othro problems. She had some dizziness on scene but no longer dizzy.   Medic Vitals  123/93 61hr 97%ra 309bgl

## 2023-10-30 NOTE — Hospital Course (Signed)
Peggy Chen is a 72 y.o. female with medical history significant for PAF on Coumadin, CKD stage IIIa, T2DM, HTN, HLD, renal artery stenosis who presented after a fall at home found to have AKI on CKD stage IIIa, acute on chronic anemia, and supratherapeutic INR.

## 2023-10-31 DIAGNOSIS — I480 Paroxysmal atrial fibrillation: Secondary | ICD-10-CM | POA: Diagnosis present

## 2023-10-31 DIAGNOSIS — Z7901 Long term (current) use of anticoagulants: Secondary | ICD-10-CM | POA: Diagnosis not present

## 2023-10-31 DIAGNOSIS — Z8249 Family history of ischemic heart disease and other diseases of the circulatory system: Secondary | ICD-10-CM | POA: Diagnosis not present

## 2023-10-31 DIAGNOSIS — E1169 Type 2 diabetes mellitus with other specified complication: Secondary | ICD-10-CM | POA: Diagnosis present

## 2023-10-31 DIAGNOSIS — N189 Chronic kidney disease, unspecified: Secondary | ICD-10-CM

## 2023-10-31 DIAGNOSIS — Z85828 Personal history of other malignant neoplasm of skin: Secondary | ICD-10-CM | POA: Diagnosis not present

## 2023-10-31 DIAGNOSIS — I440 Atrioventricular block, first degree: Secondary | ICD-10-CM | POA: Diagnosis present

## 2023-10-31 DIAGNOSIS — N179 Acute kidney failure, unspecified: Secondary | ICD-10-CM | POA: Diagnosis present

## 2023-10-31 DIAGNOSIS — D649 Anemia, unspecified: Secondary | ICD-10-CM | POA: Diagnosis present

## 2023-10-31 DIAGNOSIS — E785 Hyperlipidemia, unspecified: Secondary | ICD-10-CM | POA: Diagnosis present

## 2023-10-31 DIAGNOSIS — Z87891 Personal history of nicotine dependence: Secondary | ICD-10-CM | POA: Diagnosis not present

## 2023-10-31 DIAGNOSIS — G8929 Other chronic pain: Secondary | ICD-10-CM | POA: Diagnosis present

## 2023-10-31 DIAGNOSIS — Y92009 Unspecified place in unspecified non-institutional (private) residence as the place of occurrence of the external cause: Secondary | ICD-10-CM | POA: Diagnosis not present

## 2023-10-31 DIAGNOSIS — M6282 Rhabdomyolysis: Secondary | ICD-10-CM | POA: Diagnosis present

## 2023-10-31 DIAGNOSIS — Z79899 Other long term (current) drug therapy: Secondary | ICD-10-CM | POA: Diagnosis not present

## 2023-10-31 DIAGNOSIS — W010XXA Fall on same level from slipping, tripping and stumbling without subsequent striking against object, initial encounter: Secondary | ICD-10-CM | POA: Diagnosis present

## 2023-10-31 DIAGNOSIS — Z87442 Personal history of urinary calculi: Secondary | ICD-10-CM | POA: Diagnosis not present

## 2023-10-31 DIAGNOSIS — I10 Essential (primary) hypertension: Secondary | ICD-10-CM | POA: Diagnosis present

## 2023-10-31 DIAGNOSIS — N1831 Chronic kidney disease, stage 3a: Secondary | ICD-10-CM | POA: Diagnosis present

## 2023-10-31 DIAGNOSIS — T45515A Adverse effect of anticoagulants, initial encounter: Secondary | ICD-10-CM | POA: Diagnosis present

## 2023-10-31 DIAGNOSIS — D689 Coagulation defect, unspecified: Secondary | ICD-10-CM | POA: Diagnosis present

## 2023-10-31 DIAGNOSIS — D72829 Elevated white blood cell count, unspecified: Secondary | ICD-10-CM | POA: Diagnosis present

## 2023-10-31 DIAGNOSIS — E1122 Type 2 diabetes mellitus with diabetic chronic kidney disease: Secondary | ICD-10-CM | POA: Diagnosis present

## 2023-10-31 DIAGNOSIS — Z7982 Long term (current) use of aspirin: Secondary | ICD-10-CM | POA: Diagnosis not present

## 2023-10-31 DIAGNOSIS — Z880 Allergy status to penicillin: Secondary | ICD-10-CM | POA: Diagnosis not present

## 2023-10-31 DIAGNOSIS — Z825 Family history of asthma and other chronic lower respiratory diseases: Secondary | ICD-10-CM | POA: Diagnosis not present

## 2023-10-31 DIAGNOSIS — M549 Dorsalgia, unspecified: Secondary | ICD-10-CM | POA: Diagnosis present

## 2023-10-31 LAB — COMPREHENSIVE METABOLIC PANEL
ALT: 25 U/L (ref 0–44)
AST: 28 U/L (ref 15–41)
Albumin: 3 g/dL — ABNORMAL LOW (ref 3.5–5.0)
Alkaline Phosphatase: 50 U/L (ref 38–126)
Anion gap: 11 (ref 5–15)
BUN: 42 mg/dL — ABNORMAL HIGH (ref 8–23)
CO2: 24 mmol/L (ref 22–32)
Calcium: 8.7 mg/dL — ABNORMAL LOW (ref 8.9–10.3)
Chloride: 100 mmol/L (ref 98–111)
Creatinine, Ser: 2.04 mg/dL — ABNORMAL HIGH (ref 0.44–1.00)
GFR, Estimated: 25 mL/min — ABNORMAL LOW (ref >60)
Glucose, Bld: 144 mg/dL — ABNORMAL HIGH (ref 70–99)
Potassium: 4.2 mmol/L (ref 3.5–5.1)
Sodium: 135 mmol/L (ref 135–145)
Total Bilirubin: 1.6 mg/dL — ABNORMAL HIGH (ref <1.2)
Total Protein: 5.7 g/dL — ABNORMAL LOW (ref 6.5–8.1)

## 2023-10-31 LAB — PROTIME-INR
INR: 4.7 (ref 0.8–1.2)
Prothrombin Time: 44.6 s — ABNORMAL HIGH (ref 11.4–15.2)

## 2023-10-31 LAB — HEMOGLOBIN AND HEMATOCRIT, BLOOD
HCT: 24 % — ABNORMAL LOW (ref 36.0–46.0)
Hemoglobin: 7.5 g/dL — ABNORMAL LOW (ref 12.0–15.0)

## 2023-10-31 LAB — GLUCOSE, CAPILLARY
Glucose-Capillary: 162 mg/dL — ABNORMAL HIGH (ref 70–99)
Glucose-Capillary: 172 mg/dL — ABNORMAL HIGH (ref 70–99)
Glucose-Capillary: 158 mg/dL — ABNORMAL HIGH (ref 70–99)
Glucose-Capillary: 225 mg/dL — ABNORMAL HIGH (ref 70–99)

## 2023-10-31 LAB — URINALYSIS, ROUTINE W REFLEX MICROSCOPIC
Bilirubin Urine: NEGATIVE
Glucose, UA: NEGATIVE mg/dL
Hgb urine dipstick: NEGATIVE
Ketones, ur: NEGATIVE mg/dL
Leukocytes,Ua: NEGATIVE
Nitrite: NEGATIVE
Protein, ur: NEGATIVE mg/dL
Specific Gravity, Urine: 1.008 (ref 1.005–1.030)
pH: 5 (ref 5.0–8.0)

## 2023-10-31 LAB — CBC
HCT: 23.8 % — ABNORMAL LOW (ref 36.0–46.0)
Hemoglobin: 7.2 g/dL — ABNORMAL LOW (ref 12.0–15.0)
MCH: 28.6 pg (ref 26.0–34.0)
MCHC: 30.3 g/dL (ref 30.0–36.0)
MCV: 94.4 fL (ref 80.0–100.0)
Platelets: 317 10*3/uL (ref 150–400)
RBC: 2.52 MIL/uL — ABNORMAL LOW (ref 3.87–5.11)
RDW: 14.1 % (ref 11.5–15.5)
WBC: 11 10*3/uL — ABNORMAL HIGH (ref 4.0–10.5)
nRBC: 0 % (ref 0.0–0.2)

## 2023-10-31 LAB — CK: Total CK: 256 U/L — ABNORMAL HIGH (ref 38–234)

## 2023-10-31 MED ORDER — LOPERAMIDE HCL 2 MG PO CAPS
2.0000 mg | ORAL_CAPSULE | Freq: Once | ORAL | Status: AC
  Administered 2023-10-31: 2 mg via ORAL
  Filled 2023-10-31: qty 1

## 2023-10-31 MED ORDER — SODIUM CHLORIDE 0.9 % IV SOLN: INTRAVENOUS | Status: AC

## 2023-10-31 NOTE — Progress Notes (Signed)
PROGRESS NOTE    Peggy Chen  WUJ:[id number] DOB: 03-15-1951 DOA: 10/30/2023 PCP: Chilton Greathouse, MD   Brief Narrative:  This 72 yrs old female with medical history significant for PAF on Coumadin, CKD stage IIIa, T2DM, HTN, HLD, renal artery stenosis who presented to the ED for evaluation after a fall at home. She reports she has lost balance and fell. Denies any head injury.  Patient denies any dizziness prior to the falls but reports having dizziness afterwards.  She also reports with 1 of these falls she has developed bruises on her left lateral and posterior thorax and left upper arm.  Per EMS patient was found to be hypotensive on arrival.  Significant labs include INR 6.7, creatinine 2.10, hemoglobin 8.5, lactic acid 1.4, CK1 94 FOBT negative.  Chest x-ray negative for acute infiltrate. CT head negative for acute intracranial process.  Left hip x-ray negative for acute fracture.  Patient was given vitamin K and started on IV fluids.  Admitted for further evaluation.  Assessment & Plan:   Principal Problem:   Acute kidney injury superimposed on chronic kidney disease (HCC) Active Problems:   Paroxysmal atrial fibrillation (HCC)   Supratherapeutic INR   Normocytic anemia   Leukocytosis   Fall at home, initial encounter   Type 2 diabetes mellitus (HCC)   Hypertension associated with diabetes (HCC)  AKI on CKD stage IIIa: Baseline creatinine remains around 1.25, creatinine on admission 2.10 She does have baseline CKD stage IIIa-b.   Suspect secondary to volume depletion, medication effect, and potentially early developing rhabdomyolysis.   CK was only mildly elevated at 294. Continue IV fluid resuscitation. Hold metformin, olmesartan, hydrochlorothiazide Monitor urine output.  Serum creatinine improving   Paroxysmal A-fib on anticoagulation: Supratherapeutic INR: She remains in sinus rhythm with controlled rate.  INR is 6.7.   Has ecchymosis to the left lateral/posterior  thorax and posterior upper arm but no obvious bleeding. S/p 1 mg IV vitamin K given in the ED Holding Coumadin Recheck INR in a.m. Continue flecainide Holding Toprol-XL for now given hypotension PTA   Normocytic anemia: Hemoglobin down to 8.5 compared to previous 11.9 on 08/19/2023.   INR supratherapeutic as above but  denies obvious bleeding.  FOBT is negative. Holding Coumadin, repeat labs in a.m.   Leukocytosis: WBC 15.1 on admit.  No obvious infection.   Suspect reactive in setting of large ecchymosis.   Fall at home: History suggestive of mechanical falls.   Ecchymosis to the left lateral thorax and posterior left upper arm otherwise no significant injury noted.  No LOC.   Lives alone and usually ambulates with cane or rollator.   Head CT, left hip x-ray, and CXR negative for traumatic injury. PT/OT eval, fall precautions   Type 2 diabetes: Holding home meds and placed on SSI.   Hypertension: Hold Toprol-XL, olmesartan, HCTZ as above.   Chronic back pain: Continue Cymbalta and Norco as needed with hold parameters.   DVT prophylaxis: SCDs Code Status: Full code Family Communication: No family at bed side Disposition Plan:   Status is: Observation The patient remains OBS appropriate and will d/c before 2 midnights.  Admitted for recurrent falls and supratherapeutic INR in the setting of paroxysmal A-fib on chronic anticoagulation. Given vitamin K.  Monitor INR and PT and OT evaluation    Consultants:  None  Procedures: CT head.  Antimicrobials:  Anti-infectives (From admission, onward)    None      Subjective: Patient was seen and examined at bedside.  Overnight events noted.   Patient reports doing much better, she  wants to be discharged.  Objective: Vitals:   10/31/23 0002 10/31/23 0332 10/31/23 0848 10/31/23 1129  BP:  134/85 102/80 (!) 133/56  Pulse:  77 75 84  Resp:  17 19 18   Temp:  97.9 F (36.6 C) 98.4 F (36.9 C) 98.3 F (36.8 C)   TempSrc:  Oral Oral Oral  SpO2:  98% 97% 99%  Weight: 83.6 kg     Height: 5\' 4"  (1.626 m)       Intake/Output Summary (Last 24 hours) at 10/31/2023 1258 Last data filed at 10/31/2023 0854 Gross per 24 hour  Intake 290 ml  Output --  Net 290 ml   Filed Weights   10/31/23 0002  Weight: 83.6 kg    Examination:  General exam: Appears calm and comfortable, deconditioned, not in any acute distress. Respiratory system: Clear to auscultation. Respiratory effort normal.  RR 16 Cardiovascular system: S1 & S2 heard, RRR. No JVD, murmurs, rubs, gallops or clicks. No pedal edema. Gastrointestinal system: Abdomen is nondistended, soft and nontender.  Normal bowel sounds heard. Central nervous system: Alert and oriented x 3. No focal neurological deficits. Extremities: No edema, no cyanosis, no clubbing Skin: No rashes, lesions or ulcers Psychiatry: Judgement and insight appear normal. Mood & affect appropriate.     Data Reviewed: I have personally reviewed following labs and imaging studies  CBC: Recent Labs  Lab 10/30/23 1907 10/30/23 1941 10/31/23 0247 10/31/23 0857  WBC 15.1*  --  11.0*  --   HGB 8.5* 9.2* 7.2* 7.5*  HCT 27.7* 27.0* 23.8* 24.0*  MCV 95.8  --  94.4  --   PLT 391  --  317  --    Basic Metabolic Panel: Recent Labs  Lab 10/30/23 1913 10/30/23 1941 10/31/23 0247  NA 134* 137 135  K 4.1 4.2 4.2  CL 98 98 100  CO2 27  --  24  GLUCOSE 194* 191* 144*  BUN 44* 43* 42*  CREATININE 2.10* 2.10* 2.04*  CALCIUM 9.3  --  8.7*   GFR: Estimated Creatinine Clearance: 25.7 mL/min (A) (by C-G formula based on SCr of 2.04 mg/dL (H)). Liver Function Tests: Recent Labs  Lab 10/30/23 1913 10/31/23 0247  AST 30 28  ALT 29 25  ALKPHOS 49 50  BILITOT 1.5* 1.6*  PROT 6.7 5.7*  ALBUMIN 3.5 3.0*   No results for input(s): "LIPASE", "AMYLASE" in the last 168 hours. No results for input(s): "AMMONIA" in the last 168 hours. Coagulation Profile: Recent Labs  Lab  10/30/23 1913 10/31/23 0247  INR 6.7* 4.7*   Cardiac Enzymes: Recent Labs  Lab 10/30/23 1907 10/31/23 0247  CKTOTAL 294* 256*   BNP (last 3 results) No results for input(s): "PROBNP" in the last 8760 hours. HbA1C: No results for input(s): "HGBA1C" in the last 72 hours. CBG: Recent Labs  Lab 10/30/23 2256 10/31/23 0624 10/31/23 1126  GLUCAP 243* 162* 172*   Lipid Profile: No results for input(s): "CHOL", "HDL", "LDLCALC", "TRIG", "CHOLHDL", "LDLDIRECT" in the last 72 hours. Thyroid Function Tests: No results for input(s): "TSH", "T4TOTAL", "FREET4", "T3FREE", "THYROIDAB" in the last 72 hours. Anemia Panel: No results for input(s): "VITAMINB12", "FOLATE", "FERRITIN", "TIBC", "IRON", "RETICCTPCT" in the last 72 hours. Sepsis Labs: Recent Labs  Lab 10/30/23 1941  LATICACIDVEN 1.4    No results found for this or any previous visit (from the past 240 hour(s)).   Radiology Studies: DG Hip Unilat W or  Wo Pelvis 2-3 Views Left  Result Date: 10/30/2023 CLINICAL DATA:  Fall with left leg pain EXAM: DG HIP (WITH OR WITHOUT PELVIS) 2-3V LEFT COMPARISON:  11/24/2017 FINDINGS: No acute fracture or dislocation. Mild degenerative changes pubic symphysis, both hips, SI joints and lower lumbar spine. IMPRESSION: No acute fracture or dislocation. Electronically Signed   By: Minerva Fester M.D.   On: 10/30/2023 21:05   CT Head Wo Contrast  Result Date: 10/30/2023 CLINICAL DATA:  Repeated falls, on blood thinners EXAM: CT HEAD WITHOUT CONTRAST TECHNIQUE: Contiguous axial images were obtained from the base of the skull through the vertex without intravenous contrast. RADIATION DOSE REDUCTION: This exam was performed according to the departmental dose-optimization program which includes automated exposure control, adjustment of the mA and/or kV according to patient size and/or use of iterative reconstruction technique. COMPARISON:  08/09/2018 FINDINGS: Brain: No evidence of acute infarction,  hemorrhage, mass, mass effect, or midline shift. No hydrocephalus or extra-axial fluid collection. Periventricular white matter changes, likely the sequela of chronic small vessel ischemic disease. Age related cerebral atrophy. 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 Dilated perivascular spaces versus lacunar infarcts in the right basal ganglia Vascular: No hyperdense vessel. Skull: Negative for fracture or focal lesion. Sinuses/Orbits: No acute finding. Status post bilateral lens replacements. Other: The mastoid air cells are well aerated. IMPRESSION: No acute intracranial process. Electronically Signed   By: Wiliam Ke M.D.   On: 10/30/2023 19:56   DG Chest Portable 1 View  Result Date: 10/30/2023 CLINICAL DATA:  Fall, rib pain EXAM: PORTABLE CHEST 1 VIEW COMPARISON:  11/24/2017 FINDINGS: Lungs are clear. No pneumothorax or pleural effusion. Cardiac size within normal limits. Pulmonary vascularity is normal. No acute bone abnormality. IMPRESSION: 1. No active disease. Electronically Signed   By: Helyn Numbers M.D.   On: 10/30/2023 19:31     Scheduled Meds:  DULoxetine  60 mg Oral Daily   flecainide  75 mg Oral BID   insulin aspart  0-5 Units Subcutaneous QHS   insulin aspart  0-9 Units Subcutaneous TID WC   sodium chloride flush  3 mL Intravenous Q12H   Continuous Infusions:   LOS: 0 days    Time spent: 50 mins    Willeen Niece, MD [facility]   If 7PM-7AM, please contact night-coverage

## 2023-10-31 NOTE — Progress Notes (Signed)
Mobility Specialist Progress Note:    10/31/23 1121  Mobility  Activity Transferred from chair to bed  Level of Assistance Contact guard assist, steadying assist  Assistive Device Front wheel walker  Distance Ambulated (ft) 3 ft  Activity Response Tolerated well  Mobility Referral Yes  $Mobility charge 1 Mobility  Mobility Specialist Start Time (ACUTE ONLY) [id number]  Mobility Specialist Stop Time (ACUTE ONLY) 0930  Mobility Specialist Time Calculation (min) (ACUTE ONLY) 12 min   Pt received in chair requesting to get back in bed. States that she is "panicked and feels closed in in the chair". No physical assistance needed during session. Transferred back to bed w/o fault. Call bell and personal belongings in reach. All needs met w/ RN in room.  Thompson Grayer Mobility Specialist  Please contact vis Secure Chat or  Rehab Office [phone]

## 2023-10-31 NOTE — Plan of Care (Signed)
Patient remains on [facility]-3E at time of writing. Patient is AA+Ox4. Fall risk bundle implemented by this RN. Patient endorses on-going chronic pain (see PRN meds). MIVF via PIV.   Problem: Education: Goal: Ability to describe self-care measures that may prevent or decrease complications (Diabetes Survival Skills Education) will improve Outcome: Progressing Goal: Individualized Educational Video(s) Outcome: Progressing   Problem: Coping: Goal: Ability to adjust to condition or change in health will improve Outcome: Progressing   Problem: Fluid Volume: Goal: Ability to maintain a balanced intake and output will improve Outcome: Progressing   Problem: Health Behavior/Discharge Planning: Goal: Ability to identify and utilize available resources and services will improve Outcome: Progressing Goal: Ability to manage health-related needs will improve Outcome: Progressing   Problem: Metabolic: Goal: Ability to maintain appropriate glucose levels will improve Outcome: Progressing   Problem: Nutritional: Goal: Maintenance of adequate nutrition will improve Outcome: Progressing Goal: Progress toward achieving an optimal weight will improve Outcome: Progressing   Problem: Skin Integrity: Goal: Risk for impaired skin integrity will decrease Outcome: Progressing   Problem: Tissue Perfusion: Goal: Adequacy of tissue perfusion will improve Outcome: Progressing   Problem: Education: Goal: Knowledge of General Education information will improve Description: Including pain rating scale, medication(s)/side effects and non-pharmacologic comfort measures Outcome: Progressing   Problem: Health Behavior/Discharge Planning: Goal: Ability to manage health-related needs will improve Outcome: Progressing   Problem: Clinical Measurements: Goal: Ability to maintain clinical measurements within normal limits will improve Outcome: Progressing Goal: Will remain free from infection Outcome:  Progressing Goal: Diagnostic test results will improve Outcome: Progressing Goal: Respiratory complications will improve Outcome: Progressing Goal: Cardiovascular complication will be avoided Outcome: Progressing   Problem: Activity: Goal: Risk for activity intolerance will decrease Outcome: Progressing   Problem: Nutrition: Goal: Adequate nutrition will be maintained Outcome: Progressing   Problem: Coping: Goal: Level of anxiety will decrease Outcome: Progressing   Problem: Elimination: Goal: Will not experience complications related to bowel motility Outcome: Progressing Goal: Will not experience complications related to urinary retention Outcome: Progressing   Problem: Pain Management: Goal: General experience of comfort will improve Outcome: Progressing   Problem: Safety: Goal: Ability to remain free from injury will improve Outcome: Progressing   Problem: Skin Integrity: Goal: Risk for impaired skin integrity will decrease Outcome: Progressing   Problem: Education: Goal: Knowledge of disease and its progression will improve Outcome: Progressing Goal: Individualized Educational Video(s) Outcome: Progressing   Problem: Fluid Volume: Goal: Compliance with measures to maintain balanced fluid volume will improve Outcome: Progressing   Problem: Health Behavior/Discharge Planning: Goal: Ability to manage health-related needs will improve Outcome: Progressing   Problem: Nutritional: Goal: Ability to make healthy dietary choices will improve Outcome: Progressing   Problem: Clinical Measurements: Goal: Complications related to the disease process, condition or treatment will be avoided or minimized Outcome: Progressing

## 2023-10-31 NOTE — Evaluation (Signed)
Physical Therapy Evaluation Patient Details Name: Peggy Chen MRN: [id number] DOB: 08-27-1951 Today's Date: 10/31/2023  History of Present Illness  The pt is a 72 yo female presenting 11/23 with repeated falls, most recently causing her to be down for 2 hours on day of admission. Work up revealed AKI on CKD and INR of 6.7. PMH includes: anxiety, arthritis, PAF on Coumadin, CKD III, DM II, HTN, HLD, and renal artery stenosis.   Clinical Impression  Pt in bed upon arrival of PT, agreeable to evaluation at this time. Prior to admission the pt was using rollator in the home and cane when in community, reports 4 falls since September. The pt lives alone and has very limited social support, reports most limited by back pain, unable to tolerate upright activity for >5 min due to pain and only relieving position is supine in bed. The pt was able to complete ambulation in the room with use of RW and CGA without LOB or instability, VSS on RA. The pt did need increased assist with bed mobility, possibly due to pain, and increased cues for sequencing movement and safety. Pt adamant about return to home, discussed pt pursuing Assisted Living facility as this would likely be safest in long run. Pt continued to state "I just need to get home, then I can manage." Recommend continued skilled PT to ensure safest possible home environment and then progress to OPPT for back pain management.     If plan is discharge home, recommend the following: A little help with walking and/or transfers;A little help with bathing/dressing/bathroom;Assistance with cooking/housework   Can travel by private vehicle        Equipment Recommendations None recommended by PT  Recommendations for Other Services       Functional Status Assessment Patient has had a recent decline in their functional status and demonstrates the ability to make significant improvements in function in a reasonable and predictable amount of time.      Precautions / Restrictions Precautions Precautions: Fall Precaution Comments: multiple falls since sept Restrictions Weight Bearing Restrictions: No      Mobility  Bed Mobility Overal bed mobility: Needs Assistance Bed Mobility: Sidelying to Sit, Rolling Rolling: Min assist, Used rails Sidelying to sit: Mod assist, Used rails, HOB elevated       General bed mobility comments: pt needing modA to complete bed mobility, used rails and HOB elevated    Transfers Overall transfer level: Needs assistance Equipment used: Rolling walker (2 wheels) Transfers: Sit to/from Stand, Bed to chair/wheelchair/BSC Sit to Stand: Contact guard assist   Step pivot transfers: Contact guard assist       General transfer comment: CGA with cues for hand placement and safety. slow to rise and significant trunk flexion due to back pain    Ambulation/Gait Ambulation/Gait assistance: Contact guard assist Gait Distance (Feet): 25 Feet Assistive device: Rolling walker (2 wheels) Gait Pattern/deviations: Step-through pattern, Decreased stride length Gait velocity: decreased Gait velocity interpretation: <1.31 ft/sec, indicative of household ambulator   General Gait Details: small steps with feet in external rotation. no buckling or LOB, pt limited by back pain and poor endurance      Balance Overall balance assessment: Needs assistance, History of Falls Sitting-balance support: No upper extremity supported Sitting balance-Leahy Scale: Good     Standing balance support: Bilateral upper extremity supported, During functional activity, Single extremity supported Standing balance-Leahy Scale: Fair Standing balance comment: can static stand with single UE support, BUE support for gait, hx of  4 falls. pt unable to recall any patterns associated wtih falls (tripping, turnning, dizziness, first thing in morning etc)                             Pertinent Vitals/Pain Pain Assessment Pain  Assessment: Faces Faces Pain Scale: Hurts whole lot Pain Location: back Pain Descriptors / Indicators: Aching, Discomfort, Grimacing, Moaning, Spasm Pain Intervention(s): Limited activity within patient's tolerance, Monitored during session, Repositioned    Home Living Family/patient expects to be discharged to:: Private residence Living Arrangements: Alone Available Help at Discharge: Friend(s);Available PRN/intermittently Type of Home: House Home Access: Stairs to enter Entrance Stairs-Rails: None Entrance Stairs-Number of Steps: 1   Home Layout: One level Home Equipment: Grab bars - tub/shower;Grab bars - toilet;Rollator (4 wheels);Cane - single point Additional Comments: no local family    Prior Function Prior Level of Function : Driving;History of Falls (last six months);Independent/Modified Independent             Mobility Comments: pt reports use of rollator, limited to 5 min standing due to back pain ADLs Comments: limited by back pain but independent when not in pain     Extremity/Trunk Assessment   Upper Extremity Assessment Upper Extremity Assessment: Defer to OT evaluation    Lower Extremity Assessment Lower Extremity Assessment: Generalized weakness    Cervical / Trunk Assessment Cervical / Trunk Assessment: Kyphotic;Other exceptions Cervical / Trunk Exceptions: large body habitus, rounded shoulders  Communication   Communication Communication: No apparent difficulties Cueing Techniques: Verbal cues  Cognition Arousal: Alert Behavior During Therapy: WFL for tasks assessed/performed Overall Cognitive Status: Within Functional Limits for tasks assessed                                 General Comments: poor safety insight, no memory of falls or cause. determined to go home and hesitatnt to pursue assisted living due to cost despite difficulty managing at home        General Comments General comments (skin integrity, edema, etc.): VSS on  RA. pt c/o significant back pain, uable to find position of comfort        Assessment/Plan    PT Assessment Patient needs continued PT services  PT Problem List Decreased strength;Decreased range of motion;Decreased activity tolerance;Decreased balance;Decreased mobility;Pain       PT Treatment Interventions Gait training;DME instruction;Functional mobility training;Stair training;Therapeutic activities;Therapeutic exercise;Balance training;Patient/family education    PT Goals (Current goals can be found in the Care Plan section)  Acute Rehab PT Goals Patient Stated Goal: "I just need to get home, then I can manage" PT Goal Formulation: With patient Time For Goal Achievement: 11/14/23 Potential to Achieve Goals: Good    Frequency Min 1X/week        AM-PAC PT "6 Clicks" Mobility  Outcome Measure Help needed turning from your back to your side while in a flat bed without using bedrails?: A Little Help needed moving from lying on your back to sitting on the side of a flat bed without using bedrails?: A Lot Help needed moving to and from a bed to a chair (including a wheelchair)?: A Little Help needed standing up from a chair using your arms (e.g., wheelchair or bedside chair)?: A Little Help needed to walk in hospital room?: A Little Help needed climbing 3-5 steps with a railing? : A Little 6 Click Score: 17  End of Session Equipment Utilized During Treatment: Gait belt Activity Tolerance: Patient limited by pain Patient left: in chair;with call bell/phone within reach Nurse Communication: Mobility status PT Visit Diagnosis: Unsteadiness on feet (R26.81);Other abnormalities of gait and mobility (R26.89);Repeated falls (R29.6);Pain Pain - part of body:  (back)    Time: 1601-0932 PT Time Calculation (min) (ACUTE ONLY): 37 min   Charges:   PT Evaluation $PT Eval Moderate Complexity: 1 Mod PT Treatments $Therapeutic Activity: 8-22 mins PT General Charges $$ ACUTE PT  VISIT: 1 Visit         Vickki Muff, PT, DPT   Acute Rehabilitation Department Office [phone] Secure Chat Communication Preferred  Ronnie Derby 10/31/2023, 10:26 AM

## 2023-10-31 NOTE — Care Management Obs Status (Signed)
MEDICARE OBSERVATION STATUS NOTIFICATION   Patient Details  Name: MAYKAYLA RAWN MRN: [id number] Date of Birth: [birth year]   Medicare Observation Status Notification Given:  Yes    Lawerance Sabal, RN 10/31/2023, 2:03 PM

## 2023-10-31 NOTE — Progress Notes (Signed)
PHARMACY - ANTICOAGULATION CONSULT NOTE  Pharmacy Consult for warfarin dosing Indication: PMH atrial fibrillation  Allergies  Allergen Reactions   Penicillins Hives    Has patient had a PCN reaction causing immediate rash, facial/tongue/throat swelling, SOB or lightheadedness with hypotension: Hives Has patient had a PCN reaction causing severe rash involving mucus membranes or skin necrosis: Unk Has patient had a PCN reaction that required hospitalization: No Has patient had a PCN reaction occurring within the last 10 years: No If all of the above answers are "NO", then may proceed with Cephalosporin use.     Patient Measurements: Height: 5\' 4"  (162.6 cm) Weight: 83.6 kg (184 lb 4.9 oz) IBW/kg (Calculated) : 54.7  Vital Signs: Temp: 98.3 F (36.8 C) (11/24 1129) Temp Source: Oral (11/24 1129) BP: 133/56 (11/24 1129) Pulse Rate: 84 (11/24 1129)  Labs: Recent Labs    10/30/23 1907 10/30/23 1913 10/30/23 1941 10/31/23 0247 10/31/23 0857  HGB 8.5*  --  9.2* 7.2* 7.5*  HCT 27.7*  --  27.0* 23.8* 24.0*  PLT 391  --   --  317  --   LABPROT  --  58.7*  --  44.6*  --   INR  --  6.7*  --  4.7*  --   CREATININE  --  2.10* 2.10* 2.04*  --   CKTOTAL 294*  --   --  256*  --   TROPONINIHS 7  --   --   --   --     Estimated Creatinine Clearance: 25.7 mL/min (A) (by C-G formula based on SCr of 2.04 mg/dL (H)).   Medical History: Past Medical History:  Diagnosis Date   Anxiety    Arthritis    Asthma    Bronchitis   Atherosclerosis 11/25/2017   noted on CT   Back pain    Depression    Diabetes mellitus    Diverticulosis 11/25/2017   mild, noted on CT   Fatty liver 11/25/2017   noted on CT   First degree AV block    History of kidney stones    Hydronephrosis 11/25/2017   mild left noted on CT   Hyperlipidemia    Hypertension    PAF (paroxysmal atrial fibrillation) (HCC)    Renal cyst 11/2017   Left, noted on [place]   Right renal artery stenosis (HCC)    Sinus  bradycardia    Skin cancer    questionable skin cancer on face years ago   UTI (urinary tract infection)     Medications:  Scheduled:   DULoxetine  60 mg Oral Daily   flecainide  75 mg Oral BID   insulin aspart  0-5 Units Subcutaneous QHS   insulin aspart  0-9 Units Subcutaneous TID WC   sodium chloride flush  3 mL Intravenous Q12H   PRN: acetaminophen **OR** acetaminophen, HYDROcodone-acetaminophen, prochlorperazine, senna-docusate, zolpidem  Assessment: 72 yo female admitted for fall. CT Head negative. PMH pAF on Warfarin- 2.5mg  MThu, 5mg  all other days. Last reported dose 11/23 am. INR on admit supratherapeutic at 6.7, received Vit K IV x1 in ED.   AM INR remains supratherapeutic at 4.7. CBC ok - Hgb 7.2, PLTc 317. No signs of bleeding noted.  Goal of Therapy:  INR 2-3 Monitor platelets by anticoagulation protocol: Yes   Plan:  Continue to hold PTA Warfarin  F/u daily INR  Monitor CBC and signs of bleeding   Shivank Pinedo 10/31/2023,12:54 PM

## 2023-10-31 NOTE — Evaluation (Signed)
Occupational Therapy Evaluation Patient Details Name: Peggy Chen MRN: [id number] DOB: [birth year] Today's Date: 10/31/2023   History of Present Illness The pt is a 72 yo female presenting 11/23 with repeated falls, most recently causing her to be down for 2 hours on day of admission. Work up revealed AKI on CKD and INR of 6.7. PMH includes: anxiety, arthritis, PAF on Coumadin, CKD III, DM II, HTN, HLD, and renal artery stenosis.   Clinical Impression   At baseline, pt completes ADLs and most IADLs Independent to Mod I and functional transfers/mobility with a Rollator with Mod I. However, pt reports 4 falls in past 6 months. Pt drives and has a paid housekeeper once a month. Pt now presents with decreased activity tolerance, decreased dynamic balance during functional tasks, decreased B UE strength, impaired safety awareness, pain affecting functional level, and decreased safety and independence with functional tasks. Pt currently demonstrates ability to complete ADLs Independent to Contact guard assist and functional mobility/transfers with a RW with Contact guard assist. Pt will benefit from acute skilled OT services to address deficits outlined below and to increase safety and independence with ADLs, functional transfers, and functional mobility. Post acute discharge, pt will benefit from continued skilled OT services in the home to include a home safety assessment with plan for pt to progress to outpatient skilled rehab services to further address back pain.        If plan is discharge home, recommend the following: A little help with walking and/or transfers;A little help with bathing/dressing/bathroom;Assist for transportation;Help with stairs or ramp for entrance;Assistance with cooking/housework    Functional Status Assessment  Patient has had a recent decline in their functional status and demonstrates the ability to make significant improvements in function in a reasonable and  predictable amount of time.  Equipment Recommendations  None recommended by OT (Pt already has needed equipment.)    Recommendations for Other Services       Precautions / Restrictions Precautions Precautions: Fall Precaution Comments: multiple falls since sept Restrictions Weight Bearing Restrictions: No      Mobility Bed Mobility Overal bed mobility: Needs Assistance Bed Mobility: Rolling, Supine to Sit, Sit to Supine Rolling: Contact guard assist, Used rails   Supine to sit: Min assist, HOB elevated, Used rails Sit to supine: Mod assist, HOB elevated, Used rails        Transfers Overall transfer level: Needs assistance Equipment used: Rolling walker (2 wheels) Transfers: Sit to/from Stand, Bed to chair/wheelchair/BSC Sit to Stand: Contact guard assist, From elevated surface     Step pivot transfers: Contact guard assist     General transfer comment: CGA with cues for hand placement and safety      Balance Overall balance assessment: Needs assistance, History of Falls Sitting-balance support: No upper extremity supported, Feet supported Sitting balance-Leahy Scale: Good     Standing balance support: Bilateral upper extremity supported, Single extremity supported, During functional activity, Reliant on assistive device for balance Standing balance-Leahy Scale: Fair Standing balance comment: can static stand with single UE support, BUE support for gait, hx of 4 falls.                           ADL either performed or assessed with clinical judgement   ADL Overall ADL's : Needs assistance/impaired Eating/Feeding: Independent;Sitting   Grooming: Set up;Sitting Grooming Details (indicate cue type and reason): unable to tolerate grooming tasks in standing due to back pain Upper Body  Bathing: Contact guard assist;Sitting   Lower Body Bathing: Contact guard assist;Sit to/from stand;Sitting/lateral leans;Cueing for safety   Upper Body Dressing : Set  up;Sitting   Lower Body Dressing: Supervision/safety;Sitting/lateral leans;Sit to/from stand   Toilet Transfer: Contact guard assist;BSC/3in1;Rolling walker (2 wheels);Cueing for safety (step-pivot transfer)   Toileting- Clothing Manipulation and Hygiene: Supervision/safety;Sitting/lateral lean       Functional mobility during ADLs: Contact guard assist;Rolling walker (2 wheels);Cueing for safety (limitied ability to participate in functional mobility this session due to back pain) General ADL Comments: Pt presents with decreased activity tolerance and pain affecting functional level.     Vision Baseline Vision/History: 1 Wears glasses (readers; Hx cataracts with subsequent sx) Ability to See in Adequate Light: 0 Adequate (with glasses) Patient Visual Report: No change from baseline       Perception         Praxis         Pertinent Vitals/Pain Pain Assessment Pain Assessment: Faces Faces Pain Scale: Hurts whole lot Pain Location: back Pain Descriptors / Indicators: Aching, Discomfort, Grimacing, Moaning, Spasm Pain Intervention(s): Limited activity within patient's tolerance, Monitored during session, Premedicated before session, Repositioned     Extremity/Trunk Assessment Upper Extremity Assessment Upper Extremity Assessment: Right hand dominant;Overall WFL for tasks assessed (Overall strength 4/5)   Lower Extremity Assessment Lower Extremity Assessment: Defer to PT evaluation   Cervical / Trunk Assessment Cervical / Trunk Assessment: Kyphotic;Other exceptions Cervical / Trunk Exceptions: large body habitus, rounded shoulders   Communication Communication Communication: No apparent difficulties Cueing Techniques: Verbal cues (for safety and hand placement/technique)   Cognition Arousal: Alert Behavior During Therapy: WFL for tasks assessed/performed Overall Cognitive Status: Within Functional Limits for tasks assessed                                  General Comments: Largely WFL with pt AAOx4 and demonstrating ability to follow multi-step instructions consistently. However, pt with impaired safety awareness, mild short-trem memory deficits noted, and poor insight into deficits.     General Comments  VSS on RA throughout session. In speaking with RN, question arose regarding pt taking medication properly at home, potentially increasing fall risk. OT plans to complete medicationa box assessment with pt during future session.    Exercises     Shoulder Instructions      Home Living Family/patient expects to be discharged to:: Private residence Living Arrangements: Alone Available Help at Discharge: Friend(s);Available PRN/intermittently;Neighbor Type of Home: House Home Access: Stairs to enter [employer] of Steps: 1 Entrance Stairs-Rails: None Home Layout: One level     Bathroom Shower/Tub: Chief Strategy Officer: Handicapped height     Home Equipment: Grab bars - tub/shower;Grab bars - toilet;Rollator (4 wheels);Cane - single point;Hand held shower head;Shower seat   Additional Comments: no local family; has a cat      Prior Functioning/Environment Prior Level of Function : Independent/Modified Independent;Driving;History of Falls (last six months)             Mobility Comments: pt reports use of rollator, limited to 5 min standing due to back pain; 4 falls in past 6 months. pt unable to recall any patterns associated wtih falls (tripping, turnning, dizziness, first thing in morning etc). ADLs Comments: Largely Independent to Mod I but sometimes limited by back pain. Pt reports she has a paid housekeeper who comes once a month. Pt drives.  OT Problem List: Decreased strength;Decreased activity tolerance;Impaired balance (sitting and/or standing);Decreased safety awareness;Decreased knowledge of use of DME or AE;Pain      OT Treatment/Interventions: Self-care/ADL training;Therapeutic  exercise;Energy conservation;DME and/or AE instruction;Therapeutic activities;Patient/family education    OT Goals(Current goals can be found in the care plan section) Acute Rehab OT Goals Patient Stated Goal: to return home and not have more falls OT Goal Formulation: With patient Time For Goal Achievement: 11/14/23 Potential to Achieve Goals: Good ADL Goals Pt Will Perform Upper Body Bathing: with modified independence;sitting Pt Will Perform Lower Body Bathing: with modified independence;sitting/lateral leans;sit to/from stand (with adaptive equipment as needed) Pt Will Perform Lower Body Dressing: with modified independence;sitting/lateral leans;sit to/from stand (with adaptive equipment as needed) Pt Will Transfer to Toilet: with modified independence;regular height toilet;ambulating (with least restrictive AD) Additional ADL Goal #1: Patient will demonstrate ability to Independently state 4 energy conservation strategies for increased safety and independence in the home. Additional ADL Goal #2: Patient will demonstrate ability to appropriately set up a weekly medication planner with Mod I.  OT Frequency: Min 1X/week    Co-evaluation              AM-PAC OT "6 Clicks" Daily Activity     Outcome Measure Help from another person eating meals?: None Help from another person taking care of personal grooming?: A Little Help from another person toileting, which includes using toliet, bedpan, or urinal?: A Little Help from another person bathing (including washing, rinsing, drying)?: A Little Help from another person to put on and taking off regular upper body clothing?: A Little Help from another person to put on and taking off regular lower body clothing?: A Little 6 Click Score: 19   End of Session Equipment Utilized During Treatment: Rolling walker (2 wheels);Other (comment) [facility]) Nurse Communication: Mobility status;Other (comment) (back pain; question raised regarding if pt is  taking medication as prescribed at home)  Activity Tolerance: Patient limited by pain Patient left: in bed;with call bell/phone within reach;with bed alarm set  OT Visit Diagnosis: Other abnormalities of gait and mobility (R26.89);History of falling (Z91.81);Repeated falls (R29.6);Pain                Time: 1255-1343 OT Time Calculation (min): 48 min Charges:  OT General Charges $OT Visit: 1 Visit OT Evaluation $OT Eval Low Complexity: 1 Low OT Treatments $Self Care/Home Management : 23-37 mins  Shameek Nyquist "Orson Eva., OTR/L, MA Acute Rehab [phone]   Lendon Colonel 10/31/2023, 5:01 PM

## 2023-10-31 NOTE — Progress Notes (Signed)
Pt arrived to [facility] via stretcher with NS bolus and 170ml/hr NS maintenance fluid infusing on room air.  Patient given CHG and oriented to room/unit. Given call bell and instructions to utilize for assistance.

## 2023-10-31 NOTE — ED Notes (Signed)
Pt BP soft, Terrilee Croak MD notified

## 2023-11-01 ENCOUNTER — Telehealth (HOSPITAL_COMMUNITY): Payer: Self-pay | Admitting: Pharmacy Technician

## 2023-11-01 ENCOUNTER — Other Ambulatory Visit (HOSPITAL_COMMUNITY): Payer: Self-pay

## 2023-11-01 DIAGNOSIS — N189 Chronic kidney disease, unspecified: Secondary | ICD-10-CM | POA: Diagnosis not present

## 2023-11-01 DIAGNOSIS — N179 Acute kidney failure, unspecified: Secondary | ICD-10-CM | POA: Diagnosis not present

## 2023-11-01 LAB — MAGNESIUM: Magnesium: 1.7 mg/dL (ref 1.7–2.4)

## 2023-11-01 LAB — PHOSPHORUS: Phosphorus: 2.8 mg/dL (ref 2.5–4.6)

## 2023-11-01 LAB — BASIC METABOLIC PANEL
Anion gap: 9 (ref 5–15)
BUN: 36 mg/dL — ABNORMAL HIGH (ref 8–23)
CO2: 24 mmol/L (ref 22–32)
Calcium: 9 mg/dL (ref 8.9–10.3)
Chloride: 101 mmol/L (ref 98–111)
Creatinine, Ser: 1.54 mg/dL — ABNORMAL HIGH (ref 0.44–1.00)
GFR, Estimated: 35 mL/min — ABNORMAL LOW (ref >60)
Glucose, Bld: 136 mg/dL — ABNORMAL HIGH (ref 70–99)
Potassium: 4.3 mmol/L (ref 3.5–5.1)
Sodium: 134 mmol/L — ABNORMAL LOW (ref 135–145)

## 2023-11-01 LAB — CBC
HCT: 24.2 % — ABNORMAL LOW (ref 36.0–46.0)
Hemoglobin: 7.3 g/dL — ABNORMAL LOW (ref 12.0–15.0)
MCH: 29.1 pg (ref 26.0–34.0)
MCHC: 30.2 g/dL (ref 30.0–36.0)
MCV: 96.4 fL (ref 80.0–100.0)
Platelets: 309 10*3/uL (ref 150–400)
RBC: 2.51 MIL/uL — ABNORMAL LOW (ref 3.87–5.11)
RDW: 14.4 % (ref 11.5–15.5)
WBC: 11.1 10*3/uL — ABNORMAL HIGH (ref 4.0–10.5)
nRBC: 0 % (ref 0.0–0.2)

## 2023-11-01 LAB — HEMOGLOBIN AND HEMATOCRIT, BLOOD
HCT: 22.5 % — ABNORMAL LOW (ref 36.0–46.0)
Hemoglobin: 7 g/dL — ABNORMAL LOW (ref 12.0–15.0)

## 2023-11-01 LAB — PROTIME-INR
INR: 2.2 — ABNORMAL HIGH (ref 0.8–1.2)
Prothrombin Time: 24.3 s — ABNORMAL HIGH (ref 11.4–15.2)

## 2023-11-01 LAB — GLUCOSE, CAPILLARY
Glucose-Capillary: 136 mg/dL — ABNORMAL HIGH (ref 70–99)
Glucose-Capillary: 169 mg/dL — ABNORMAL HIGH (ref 70–99)
Glucose-Capillary: 153 mg/dL — ABNORMAL HIGH (ref 70–99)
Glucose-Capillary: 209 mg/dL — ABNORMAL HIGH (ref 70–99)

## 2023-11-01 LAB — PREPARE RBC (CROSSMATCH)

## 2023-11-01 MED ORDER — SODIUM CHLORIDE 0.9% IV SOLUTION: Freq: Once | INTRAVENOUS | Status: AC

## 2023-11-01 MED ORDER — WARFARIN SODIUM 2.5 MG PO TABS
2.5000 mg | ORAL_TABLET | Freq: Once | ORAL | Status: AC
  Administered 2023-11-01: 2.5 mg via ORAL
  Filled 2023-11-01: qty 1

## 2023-11-01 MED ORDER — METOPROLOL SUCCINATE ER 25 MG PO TB24: 25.0000 mg | ORAL_TABLET | Freq: Every day | ORAL | 0 refills | Status: DC

## 2023-11-01 MED ORDER — MAGNESIUM SULFATE 2 GM/50ML IV SOLN
2.0000 g | Freq: Once | INTRAVENOUS | Status: AC
Start: 2023-11-01 — End: 2023-11-01
  Administered 2023-11-01: 2 g via INTRAVENOUS
  Filled 2023-11-01: qty 50

## 2023-11-01 MED ORDER — WARFARIN - PHARMACIST DOSING INPATIENT
Freq: Every day | Status: DC
  Administered 2023-11-01: 1

## 2023-11-01 MED ORDER — METOPROLOL SUCCINATE ER 25 MG PO TB24
25.0000 mg | ORAL_TABLET | Freq: Every day | ORAL | Status: DC
  Administered 2023-11-01: 25 mg via ORAL
  Filled 2023-11-01 (×2): qty 1

## 2023-11-01 NOTE — Progress Notes (Signed)
Occupational Therapy Treatment Patient Details Name: Peggy Chen MRN: [id number] DOB: [birth year] Today's Date: 11/01/2023   History of present illness The pt is a 72 yo female presenting 11/23 with repeated falls, most recently causing her to be down for 2 hours on day of admission. Work up revealed AKI on CKD and INR of 6.7. PMH includes: anxiety, arthritis, PAF on Coumadin, CKD III, DM II, HTN, HLD, and renal artery stenosis.   OT comments  Attempted x 2 for progressive OOB ADL activity though limited due to pt pain, anxiety and restlessness in relation to chronic back pain that is uncontrolled in this setting. Pt able to demo [facility] transfers and toileting tasks without assist. Pt also participatory in fall prevention education including: use of medical alert necklace that pt has, use of DME and safety strategies for mgmt of cat (has caused 2 recent falls for pt). Pt hopeful for DC home soon.       If plan is discharge home, recommend the following:  A little help with bathing/dressing/bathroom;Assist for transportation;Help with stairs or ramp for entrance;Assistance with cooking/housework   Equipment Recommendations  None recommended by OT    Recommendations for Other Services      Precautions / Restrictions Precautions Precautions: Fall Restrictions Weight Bearing Restrictions: No       Mobility Bed Mobility Overal bed mobility: Modified Independent Bed Mobility: Sit to Supine       Sit to supine: Modified independent (Device/Increase time)        Transfers Overall transfer level: Needs assistance Equipment used: None Transfers: Sit to/from Stand, Bed to chair/wheelchair/BSC Sit to Stand: Supervision     Step pivot transfers: Supervision     General transfer comment: from [facility] back to bed. Planned further OOB activity though pt declined due to pain     Balance Overall balance assessment: Needs assistance, History of Falls Sitting-balance support: No upper  extremity supported, Feet supported Sitting balance-Leahy Scale: Good     Standing balance support: During functional activity, No upper extremity supported Standing balance-Leahy Scale: Fair                             ADL either performed or assessed with clinical judgement   ADL Overall ADL's : Needs assistance/impaired                         Toilet Transfer: Civil engineer, contracting Details (indicate cue type and reason): on BSC with NT on OT second entry. able to stand and step back to bed without AD Toileting- Clothing Manipulation and Hygiene: Set up;Sit to/from stand;Sitting/lateral lean Toileting - Clothing Manipulation Details (indicate cue type and reason): Setup for hygiene, able to manage without assist       General ADL Comments: Attempted x 2 for OOB mobility for OT session though unsuccessful due to pt back pain and restless. focused on discussion of repositioning, use of DME, fall prevention (use of medical alert necklace that she has) and compensatory strategies to get her cat into the crate safely w/o risk for falls (reports 2 falls related to cat)    Extremity/Trunk Assessment Upper Extremity Assessment Upper Extremity Assessment: Overall WFL for tasks assessed;Right hand dominant   Lower Extremity Assessment Lower Extremity Assessment: Defer to PT evaluation        Vision   Vision Assessment?: No apparent visual deficits   Perception     Praxis  Cognition Arousal: Alert Behavior During Therapy: Restless Overall Cognitive Status: Within Functional Limits for tasks assessed                                 General Comments: appears to be near baseline but very restless and distracted by pain so unable to fully assess and engage in meaningful tasks        Exercises      Shoulder Instructions       General Comments      Pertinent Vitals/ Pain       Pain Assessment Pain  Assessment: Faces Faces Pain Scale: Hurts whole lot Pain Location: back Pain Descriptors / Indicators: Discomfort, Grimacing, Moaning, Spasm Pain Intervention(s): Monitored during session, Repositioned, Premedicated before session (offered ice/heat though pt declined. repositioned with rolled blanket at spine)  Home Living                                          Prior Functioning/Environment              Frequency  Min 1X/week        Progress Toward Goals  OT Goals(current goals can now be found in the care plan section)  Progress towards OT goals: Progressing toward goals  Acute Rehab OT Goals Patient Stated Goal: home soon, get pain meds straightened out OT Goal Formulation: With patient Time For Goal Achievement: 11/14/23 Potential to Achieve Goals: Good ADL Goals Pt Will Perform Upper Body Bathing: with modified independence;sitting Pt Will Perform Lower Body Bathing: with modified independence;sitting/lateral leans;sit to/from stand Pt Will Perform Lower Body Dressing: with modified independence;sitting/lateral leans;sit to/from stand Pt Will Transfer to Toilet: with modified independence;regular height toilet;ambulating Additional ADL Goal #1: Patient will demonstrate ability to Independently state 4 energy conservation strategies for increased safety and independence in the home. Additional ADL Goal #2: Patient will demonstrate ability to appropriately set up a weekly medication planner with Mod I.  Plan      Co-evaluation                 AM-PAC OT "6 Clicks" Daily Activity     Outcome Measure   Help from another person eating meals?: None Help from another person taking care of personal grooming?: A Little Help from another person toileting, which includes using toliet, bedpan, or urinal?: A Little Help from another person bathing (including washing, rinsing, drying)?: A Little Help from another person to put on and taking off  regular upper body clothing?: A Little Help from another person to put on and taking off regular lower body clothing?: A Little 6 Click Score: 19    End of Session    OT Visit Diagnosis: Other abnormalities of gait and mobility (R26.89);History of falling (Z91.81);Repeated falls (R29.6);Pain   Activity Tolerance Patient limited by pain   Patient Left in bed;with call bell/phone within reach   Nurse Communication Mobility status;Other (comment) (pain concerns)        Time: 1610-9604 OT Time Calculation (min): 30 min  Charges: OT General Charges $OT Visit: 1 Visit OT Treatments $Self Care/Home Management : 8-22 mins $Therapeutic Activity: 8-22 mins  Bradd Canary, OTR/L Acute Rehab Services Office: [phone]   Lorre Munroe 11/01/2023, 9:43 AM

## 2023-11-01 NOTE — Progress Notes (Signed)
Mobility Specialist Progress Note:    11/01/23 1433  Mobility  Activity Refused mobility   Pt refused mobility states she "walks fine on her own and doesn't need mobility". Will f/u as able.   Thompson Grayer Mobility Specialist  Please contact vis Secure Chat or  Rehab Office [phone]

## 2023-11-01 NOTE — Progress Notes (Addendum)
PHARMACY - ANTICOAGULATION CONSULT NOTE  Pharmacy Consult for warfarin Indication: atrial fibrillation  Allergies  Allergen Reactions   Penicillins Hives    Has patient had a PCN reaction causing immediate rash, facial/tongue/throat swelling, SOB or lightheadedness with hypotension: Hives Has patient had a PCN reaction causing severe rash involving mucus membranes or skin necrosis: Unk Has patient had a PCN reaction that required hospitalization: No Has patient had a PCN reaction occurring within the last 10 years: No If all of the above answers are "NO", then may proceed with Cephalosporin use.     Patient Measurements: Height: 5\' 4"  (162.6 cm) Weight: 82.6 kg (182 lb 1.6 oz) IBW/kg (Calculated) : 54.7 Heparin Dosing Weight: 72.9 kg   Vital Signs: Temp: 97.7 F (36.5 C) (11/25 1111) Temp Source: Oral (11/25 1111) BP: 133/64 (11/25 1111) Pulse Rate: 74 (11/25 1111)  Labs: Recent Labs    10/30/23 1907 10/30/23 1907 10/30/23 1913 10/30/23 1941 10/31/23 0247 10/31/23 0857 11/01/23 0233 11/01/23 0905  HGB 8.5*  --   --  9.2* 7.2* 7.5* 7.3* 7.0*  HCT 27.7*  --   --  27.0* 23.8* 24.0* 24.2* 22.5*  PLT 391  --   --   --  317  --  309  --   LABPROT  --   --  58.7*  --  44.6*  --  24.3*  --   INR  --   --  6.7*  --  4.7*  --  2.2*  --   CREATININE  --    < > 2.10* 2.10* 2.04*  --  1.54*  --   CKTOTAL 294*  --   --   --  256*  --   --   --   TROPONINIHS 7  --   --   --   --   --   --   --    < > = values in this interval not displayed.    Estimated Creatinine Clearance: 33.8 mL/min (A) (by C-G formula based on SCr of 1.54 mg/dL (H)).   Medical History: Past Medical History:  Diagnosis Date   Anxiety    Arthritis    Asthma    Bronchitis   Atherosclerosis 11/25/2017   noted on CT   Back pain    Depression    Diabetes mellitus    Diverticulosis 11/25/2017   mild, noted on CT   Fatty liver 11/25/2017   noted on CT   First degree AV block    History of kidney  stones    Hydronephrosis 11/25/2017   mild left noted on CT   Hyperlipidemia    Hypertension    PAF (paroxysmal atrial fibrillation) (HCC)    Renal cyst 11/2017   Left, noted on [place]   Right renal artery stenosis (HCC)    Sinus bradycardia    Skin cancer    questionable skin cancer on face years ago   UTI (urinary tract infection)     Medications:  Scheduled:   sodium chloride   Intravenous Once   DULoxetine  60 mg Oral Daily   flecainide  75 mg Oral BID   insulin aspart  0-5 Units Subcutaneous QHS   insulin aspart  0-9 Units Subcutaneous TID WC   metoprolol succinate  25 mg Oral Daily   sodium chloride flush  3 mL Intravenous Q12H    Assessment: 71 yof presenting with fall (CT head negative). On warfarin PTA for hx Afib - INR on admission was elevated  at 6.7 requiring vitamin K 1 mg on 11/23.  PTA regimen is warfarin 5 mg daily except 2.5 mg on Mon/Thurs (LD 11/23 AM).   INR today down to 2.2 (within goal range after large decrease from 4.7 yesterday). Hgb 7 (was 9.2 on admission)- plan for 2 units PRBCs, plt WNL. No s/sx of bleeding.   Goal of Therapy:  INR 2-3 Monitor platelets by anticoagulation protocol: Yes   Plan:  Warfarin 2.5 mg tonight - anticipate INR will continue to decrease but balancing regimen w/ supratherapeutic INR on admission Monitor daily INR, CBC, and for s/sx of bleeding  Thank you for allowing pharmacy to participate in this patient's care,  Sherron Monday, PharmD, BCCCP Clinical Pharmacist  Phone: [phone] 11/01/2023 12:07 PM  Please check AMION for all [facility] Pharmacy phone numbers After 10:00 PM, call Main Pharmacy [phone]

## 2023-11-01 NOTE — Telephone Encounter (Signed)
Patient Product/process development scientist completed.    The patient is insured through [facility]. Patient has Medicare and is not eligible for a copay card, but may be able to apply for patient assistance, if available.    Ran test claim for Eliquis 5 mg and the current 30 day co-pay is $149.53 due to being in the Coverage Gap (donut hole).  Ran test claim for enoxaparin (Lovenox) 80 mg mg and the current 7 day co-pay is $23.71  This test claim was processed through [employer]- copay amounts may vary at other pharmacies due to [employer], or as the patient moves through the different stages of their insurance plan.     Roland Earl, CPHT Pharmacy Technician III Certified Patient Advocate [facility] Patient Advocate Team Direct Number: [phone]  Fax: [phone]

## 2023-11-01 NOTE — TOC Transition Note (Addendum)
Transition of Care [facility]) - CM/SW Discharge Note   Patient Details  Name: Peggy Chen MRN: [id number] Date of Birth: 09-07-1951  [facility] CM/SW Contact:  Leone Haven, RN Phone Number: 11/01/2023, 2:20 PM   Clinical Narrative:    For possible dc today per doctor,  she is set up with HHPT, HHOT with [facility], will need orders.  She has transportation home at dc. Per pharmacy patient will need INR check on Wed. [facility] does not have an Nurse, children's for this so she will need to go to the office to have this done, the pharmacist and the NCM called the doctors office to try to make the apt , but received vm instead.  NCM received call from the doctors office at 1723 stating they spoke with the patient and they have her scheduled for an INR on Wed at 8:30 am.    Final next level of care: Home w Home Health Services Barriers to Discharge: No Barriers Identified   Patient Goals and CMS Choice CMS Medicare.gov Compare Post Acute Care list provided to:: Patient Choice offered to / list presented to : Patient  Discharge Placement                         Discharge Plan and Services Additional resources added to the After Visit Summary for   In-house Referral: NA Discharge Planning Services: CM Consult Post Acute Care Choice: Home Health          DME Arranged: N/A DME Agency: NA       HH Arranged: PT, OT HH Agency:  Producer, television/film/video ([place])) Date HH Agency Contacted: 11/01/23 Time HH Agency Contacted: 1202 Representative spoke with at [facility] Agency: Marylene Land  Social Determinants of Health (SDOH) Interventions SDOH Screenings   Food Insecurity: No Food Insecurity (10/30/2023)  Housing: Low Risk  (10/30/2023)  Transportation Needs: No Transportation Needs (10/30/2023)  Utilities: Not At Risk (10/30/2023)  Tobacco Use: Medium Risk (10/30/2023)     Readmission Risk Interventions     No data to display

## 2023-11-01 NOTE — Progress Notes (Signed)
PROGRESS NOTE    Peggy Chen  QIO:[id number] DOB: 03/05/1951 DOA: 10/30/2023 PCP: Chilton Greathouse, MD   Brief Narrative:  This 72 yrs old female with medical history significant for PAF on Coumadin, CKD stage IIIa, T2DM, HTN, HLD, renal artery stenosis who presented to the ED for evaluation after a fall at home. She reports she has lost balance and fell. Denies any head injury.  Patient denies any dizziness prior to the falls but reports having dizziness afterwards.  She also reports with 1 of these falls she has developed bruises on her left lateral and posterior thorax and left upper arm.  Per EMS patient was found to be hypotensive on arrival.  Significant labs include INR 6.7, creatinine 2.10, hemoglobin 8.5, lactic acid 1.4, CK1 94 FOBT negative.  Chest x-ray negative for acute infiltrate. CT head negative for acute intracranial process.  Left hip x-ray negative for acute fracture.  Patient was given vitamin K and started on IV fluids.  Admitted for further evaluation.  Assessment & Plan:   Principal Problem:   Acute kidney injury superimposed on chronic kidney disease (HCC) Active Problems:   Paroxysmal atrial fibrillation (HCC)   Supratherapeutic INR   Normocytic anemia   Leukocytosis   Fall at home, initial encounter   Type 2 diabetes mellitus (HCC)   Hypertension associated with diabetes (HCC)  AKI on CKD stage IIIa: Baseline creatinine remains around 1.25, creatinine on admission 2.10 She does have baseline CKD stage IIIa-b.   Suspect secondary to volume depletion, medication effect, and potentially early developing rhabdomyolysis.   CK was only mildly elevated at 294. Continue IV fluid resuscitation. Hold metformin, olmesartan, hydrochlorothiazide Monitor urine output.  Serum creatinine improving 1.54   Paroxysmal A-fib on anticoagulation: Supratherapeutic INR: She remains in sinus rhythm with controlled rate.  INR is 6.7.   Has ecchymosis to the left  lateral/posterior thorax and posterior upper arm but no obvious bleeding. S/p 1 mg IV vitamin K given in the ED Holding Coumadin.  INR down to 2.2 Continue flecainide Holding Toprol-XL for now given hypotension PTA There is risk of INR being subtherapeutic tomorrow since she was given vitamin K and missed dosing. Pharmacy  suggested switching to other DOAC.    Normocytic anemia: Hemoglobin down to 8.5 compared to previous 11.9 on 08/19/2023.   INR supratherapeutic as above but denies obvious bleeding.  FOBT is negative. Holding Coumadin, transfuse 1 unit PRBC, follow up post transfusion cbc   Leukocytosis: WBC 15.1 on admit.  No obvious infection.   Suspect reactive in setting of large ecchymosis.   Fall at home: History suggestive of mechanical falls.   Ecchymosis to the left lateral thorax and posterior left upper arm otherwise no significant injury noted.  No LOC.   Lives alone and usually ambulates with cane or rollator.   Head CT, left hip x-ray, and CXR negative for traumatic injury. PT/OT eval, fall precautions   Type 2 diabetes: Holding home meds and placed on SSI.   Hypertension: Hold Toprol-XL, olmesartan, HCTZ as above.   Chronic back pain: Continue Cymbalta and Norco as needed with hold parameters.   DVT prophylaxis: SCDs Code Status: Full code Family Communication: No family at bed side Disposition Plan:   Status is: Observation The patient remains OBS appropriate and will d/c before 2 midnights.  Admitted for recurrent falls and supratherapeutic INR in the setting of paroxysmal A-fib on chronic anticoagulation. Given vitamin K.  Monitor INR and PT and OT evaluation   Anticipated  discharge home tomorrow with home health services pending INR check.   Consultants:  None  Procedures: CT head.  Antimicrobials:  Anti-infectives (From admission, onward)    None      Subjective: Patient was seen and examined at bedside.  Overnight events noted.    Patient reports doing better.  She wanted to be discharged but given no availability of INR check, She wants to stay overnight.  Objective: Vitals:   11/01/23 1433 11/01/23 1507 11/01/23 1508 11/01/23 1559  BP: (!) 131/54 (!) 132/56  (!) 145/63  Pulse: 82 77  89  Resp: 18 18 18 20   Temp: 97.7 F (36.5 C) 98.2 F (36.8 C) 98.2 F (36.8 C) 97.6 F (36.4 C)  TempSrc: Oral  Oral Oral  SpO2: 94%   98%  Weight:      Height:        Intake/Output Summary (Last 24 hours) at 11/01/2023 1611 Last data filed at 11/01/2023 1603 Gross per 24 hour  Intake 1302.37 ml  Output 800 ml  Net 502.37 ml   Filed Weights   10/31/23 0002 11/01/23 0306  Weight: 83.6 kg 82.6 kg    Examination:  General exam: Appears calm and comfortable, deconditioned, not in any acute distress. Respiratory system: Clear to auscultation. Respiratory effort normal.  RR 14 Cardiovascular system: S1 & S2 heard, RRR. No JVD, murmurs, rubs, gallops or clicks. No pedal edema. Gastrointestinal system: Abdomen is nondistended, soft and nontender.  Normal bowel sounds heard. Central nervous system: Alert and oriented x 3. No focal neurological deficits. Extremities: No edema, no cyanosis, no clubbing Skin: No rashes, lesions or ulcers Psychiatry: Judgement and insight appear normal. Mood & affect appropriate.     Data Reviewed: I have personally reviewed following labs and imaging studies  CBC: Recent Labs  Lab 10/30/23 1907 10/30/23 1941 10/31/23 0247 10/31/23 0857 11/01/23 0233 11/01/23 0905  WBC 15.1*  --  11.0*  --  11.1*  --   HGB 8.5* 9.2* 7.2* 7.5* 7.3* 7.0*  HCT 27.7* 27.0* 23.8* 24.0* 24.2* 22.5*  MCV 95.8  --  94.4  --  96.4  --   PLT 391  --  317  --  309  --    Basic Metabolic Panel: Recent Labs  Lab 10/30/23 1913 10/30/23 1941 10/31/23 0247 11/01/23 0233  NA 134* 137 135 134*  K 4.1 4.2 4.2 4.3  CL 98 98 100 101  CO2 27  --  24 24  GLUCOSE 194* 191* 144* 136*  BUN 44* 43* 42* 36*   CREATININE 2.10* 2.10* 2.04* 1.54*  CALCIUM 9.3  --  8.7* 9.0  MG  --   --   --  1.7  PHOS  --   --   --  2.8   GFR: Estimated Creatinine Clearance: 33.8 mL/min (A) (by C-G formula based on SCr of 1.54 mg/dL (H)). Liver Function Tests: Recent Labs  Lab 10/30/23 1913 10/31/23 0247  AST 30 28  ALT 29 25  ALKPHOS 49 50  BILITOT 1.5* 1.6*  PROT 6.7 5.7*  ALBUMIN 3.5 3.0*   No results for input(s): "LIPASE", "AMYLASE" in the last 168 hours. No results for input(s): "AMMONIA" in the last 168 hours. Coagulation Profile: Recent Labs  Lab 10/30/23 1913 10/31/23 0247 11/01/23 0233  INR 6.7* 4.7* 2.2*   Cardiac Enzymes: Recent Labs  Lab 10/30/23 1907 10/31/23 0247  CKTOTAL 294* 256*   BNP (last 3 results) No results for input(s): "PROBNP" in the last 8760  hours. HbA1C: No results for input(s): "HGBA1C" in the last 72 hours. CBG: Recent Labs  Lab 10/31/23 1641 10/31/23 2049 11/01/23 0620 11/01/23 1115 11/01/23 1553  GLUCAP 158* 225* 136* 169* 153*   Lipid Profile: No results for input(s): "CHOL", "HDL", "LDLCALC", "TRIG", "CHOLHDL", "LDLDIRECT" in the last 72 hours. Thyroid Function Tests: No results for input(s): "TSH", "T4TOTAL", "FREET4", "T3FREE", "THYROIDAB" in the last 72 hours. Anemia Panel: No results for input(s): "VITAMINB12", "FOLATE", "FERRITIN", "TIBC", "IRON", "RETICCTPCT" in the last 72 hours. Sepsis Labs: Recent Labs  Lab 10/30/23 1941  LATICACIDVEN 1.4    No results found for this or any previous visit (from the past 240 hour(s)).   Radiology Studies: DG Hip Unilat W or Wo Pelvis 2-3 Views Left  Result Date: 10/30/2023 CLINICAL DATA:  Fall with left leg pain EXAM: DG HIP (WITH OR WITHOUT PELVIS) 2-3V LEFT COMPARISON:  11/24/2017 FINDINGS: No acute fracture or dislocation. Mild degenerative changes pubic symphysis, both hips, SI joints and lower lumbar spine. IMPRESSION: No acute fracture or dislocation. Electronically Signed   By: Minerva Fester M.D.   On: 10/30/2023 21:05   CT Head Wo Contrast  Result Date: 10/30/2023 CLINICAL DATA:  Repeated falls, on blood thinners EXAM: CT HEAD WITHOUT CONTRAST TECHNIQUE: Contiguous axial images were obtained from the base of the skull through the vertex without intravenous contrast. RADIATION DOSE REDUCTION: This exam was performed according to the departmental dose-optimization program which includes automated exposure control, adjustment of the mA and/or kV according to patient size and/or use of iterative reconstruction technique. COMPARISON:  08/09/2018 FINDINGS: Brain: No evidence of acute infarction, hemorrhage, mass, mass effect, or midline shift. No hydrocephalus or extra-axial fluid collection. Periventricular white matter changes, likely the sequela of chronic small vessel ischemic disease. Age related cerebral atrophy. 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 Dilated perivascular spaces versus lacunar infarcts in the right basal ganglia Vascular: No hyperdense vessel. Skull: Negative for fracture or focal lesion. Sinuses/Orbits: No acute finding. Status post bilateral lens replacements. Other: The mastoid air cells are well aerated. IMPRESSION: No acute intracranial process. Electronically Signed   By: Wiliam Ke M.D.   On: 10/30/2023 19:56   DG Chest Portable 1 View  Result Date: 10/30/2023 CLINICAL DATA:  Fall, rib pain EXAM: PORTABLE CHEST 1 VIEW COMPARISON:  11/24/2017 FINDINGS: Lungs are clear. No pneumothorax or pleural effusion. Cardiac size within normal limits. Pulmonary vascularity is normal. No acute bone abnormality. IMPRESSION: 1. No active disease. Electronically Signed   By: Helyn Numbers M.D.   On: 10/30/2023 19:31     Scheduled Meds:  DULoxetine  60 mg Oral Daily   flecainide  75 mg Oral BID   insulin aspart  0-5 Units Subcutaneous QHS   insulin aspart  0-9 Units Subcutaneous TID WC   metoprolol succinate  25 mg Oral Daily   sodium chloride flush  3 mL  Intravenous Q12H   warfarin  2.5 mg Oral ONCE-1600   Warfarin - Pharmacist Dosing Inpatient   Does not apply q1600   Continuous Infusions:   LOS: 1 day    Time spent: 35 mins    Willeen Niece, MD [facility]   If 7PM-7AM, please contact night-coverage

## 2023-11-01 NOTE — TOC Initial Note (Addendum)
Transition of Care [facility]) - Initial/Assessment Note    Patient Details  Name: Peggy Chen MRN: [id number] Date of Birth: June 13, 1951  [facility]) CM/SW Contact:    Leone Haven, RN Phone Number: 11/01/2023, 12:03 PM  Clinical Narrative:                 From home alone, has PCP and insurance on file, states has no HH services in place at this time, has rollator, cane, and shower chair at home.   States  friend will transport her home at [employer] and family is support system, states gets [facility] [address]   Pta self ambulatory rollator.  NCM offered choice for HHPT, HHOT. She chose [facility] but they could not take rerferral. Then she chose [facility], awaiting call back from [place]. Per Marylene Land she is able to take referral for HHPT, HHOT.  Soc will begin 24 to 48 hrs post dc.   Expected Discharge Plan: Home w Home Health Services Barriers to Discharge: No Barriers Identified   Patient Goals and CMS Choice Patient states their goals for this hospitalization and ongoing recovery are:: return home CMS Medicare.gov Compare Post Acute Care list provided to:: Patient Choice offered to / list presented to : Patient      Expected Discharge Plan and Services In-house Referral: NA Discharge Planning Services: CM Consult Post Acute Care Choice: Home Health Living arrangements for the past 2 months: Single Family Home                 DME Arranged: N/A DME Agency: NA       HH Arranged: PT, OT HH Agency:  Pro[employer] television[employer]film[employer]video ([place])) Date HH Agency Contacted: 11/01/23 Time HH Agency Contacted: 1202 Representative spoke with at [facility] Agency: Marylene Land  Prior Living Arrangements/Services Living arrangements for the past 2 months: Single Family Home Lives with:: Self Patient language and need for interpreter reviewed:: Yes Do you feel safe going back to the place where you live?: Yes      Need for Family Participation in Patient Care: Yes (Comment) (Friends) Care  giver support system in place?: Yes (comment) (Friends) Current home services: DME (rollator, cane, shower chair) Criminal Activity/Legal Involvement Pertinent to Current Situation/Hospitalization: No - Comment as needed  Activities of Daily Living   ADL Screening (condition at time of admission) Independently performs ADLs?: Yes (appropriate for developmental age) Is the patient deaf or have difficulty hearing?: No Does the patient have difficulty seeing, even when wearing glasses/contacts?: No Does the patient have difficulty concentrating, remembering, or making decisions?: No  Permission Sought/Granted Permission sought to share information with : Case Manager Permission granted to share information with : Yes, Verbal Permission Granted     Permission granted to share info w AGENCY: HH        Emotional Assessment Appearance:: Appears stated age Attitude/Demeanor/Rapport: Engaged Affect (typically observed): Appropriate Orientation: : Oriented to Self, Oriented to Place, Oriented to  Time, Oriented to Situation Alcohol / Substance Use: Not Applicable Psych Involvement: No (comment)  Admission diagnosis:  Coagulopathy (HCC) [D68.9] AKI (acute kidney injury) (HCC) [N17.9] Fall, initial encounter [W19.XXXA] Acute kidney injury superimposed on chronic kidney disease (HCC) [N17.9, N18.9] Anemia, unspecified type [D64.9] Patient Active Problem List   Diagnosis Date Noted   Acute kidney injury superimposed on chronic kidney disease (HCC) 10/30/2023   Supratherapeutic INR 10/30/2023   Normocytic anemia 10/30/2023   Leukocytosis 10/30/2023   Fall at home, initial encounter 10/30/2023   Hypokalemia  11/25/2017   Falls 11/25/2017   Type 2 diabetes mellitus (HCC) 11/25/2017   Chronic anticoagulation 11/25/2017   Sepsis secondary to UTI (HCC) 11/24/2017   Hyperlipidemia 06/17/2015   Hypertension associated with diabetes (HCC) 09/28/2011   Paroxysmal atrial fibrillation (HCC)  06/29/2011   PCP:  Chilton Greathouse, MD Pharmacy:   [phone] [place], [place] - [address] AT [facility] [address] [place] [address] Phone: [phone] Fax: [phone]  [facility] [address], [place] - [address] [place] [address] Phone: [phone] Fax: [phone]  [id number] [place], [place] - [address] AT [facility] & [address] [place] [address] Phone: [phone] Fax: [phone]     Social Determinants of Health (SDOH) Social History: SDOH Screenings   Food Insecurity: No Food Insecurity (10/30/2023)  Housing: Low Risk  (10/30/2023)  Transportation Needs: No Transportation Needs (10/30/2023)  Utilities: Not At Risk (10/30/2023)  Tobacco Use: Medium Risk (10/30/2023)   SDOH Interventions:     Readmission Risk Interventions     No data to display

## 2023-11-01 NOTE — Discharge Summary (Signed)
Physician Discharge Summary  Peggy Chen WUJ:[id number] DOB: 06-Sep-1951 DOA: 10/30/2023  PCP: Chilton Greathouse, MD  Admit date: 10/30/2023  Discharge date: 11/02/2023  Admitted From: Home.  Disposition:  Home Health.  Recommendations for Outpatient Follow-up:  Follow up with PCP in 1-2 weeks. Please obtain BMP/CBC in one week Advised to hold olmesartan and HCTZ as blood pressure has been running low. Advised to follow-up with PCP on Wednesday for INR check.  Home Health:None Equipment/Devices:None  Discharge Condition: Stable CODE STATUS:Full code Diet recommendation: Heart Healthy   Brief [facility] Course: This 72 yrs old female with medical history significant for PAF on Coumadin, CKD stage IIIa, T2DM, HTN, HLD, renal artery stenosis who presented to the ED for evaluation after a fall at home. She reports she has lost balance and fell. Denies any head injury.  Patient denies any dizziness prior to the falls but reports having dizziness afterwards.  She also reports with 1 of these falls she has developed bruises on her left lateral and posterior thorax and left upper arm.  Per EMS patient was found to be hypotensive on arrival.  Significant labs include INR 6.7, creatinine 2.10, hemoglobin 8.5, lactic acid 1.4, CK 194,  FOBT negative.  Chest x-ray negative for acute infiltrate. CT head negative for acute intracranial process.  Left hip x-ray negative for acute fracture.  Patient was given vitamin K and started on IV fluids.  Admitted for further evaluation.  Renal functions has improved with IV hydration.  INR 2.0.  Patient resumed on Coumadin, advised to follow-up with PCP on Wednesday for INR check,  H&H remains stable.  PT and OT recommended home health services.  Patient is being discharged home.   Discharge Diagnoses:  Principal Problem:   Acute kidney injury superimposed on chronic kidney disease (HCC) Active Problems:   Paroxysmal atrial fibrillation (HCC)    Supratherapeutic INR   Normocytic anemia   Leukocytosis   Fall at home, initial encounter   Type 2 diabetes mellitus (HCC)   Hypertension associated with diabetes (HCC)  AKI on CKD stage IIIa: Baseline creatinine remains around 1.25, creatinine on admission 2.10 She does have baseline CKD stage IIIa-b.   Suspect secondary to volume depletion, medication effect, and potentially early developing rhabdomyolysis.   CK was only mildly elevated at 294. Continue IV fluid resuscitation. Held metformin, olmesartan, hydrochlorothiazide Monitor urine output.  Serum creatinine improving 1.54 Home medications resumed.   Paroxysmal A-fib on anticoagulation: Supratherapeutic INR: She remains in sinus rhythm with controlled rate.  INR is 6.7.   Has ecchymosis to the left lateral/posterior thorax and posterior upper arm but no obvious bleeding. S/p 1 mg IV vitamin K given in the ED Held Coumadin. Continue flecainide Resume Toprol-XL for hypertension.   Normocytic anemia: Hemoglobin down to 8.5 compared to previous 11.9 on 08/19/2023.   INR supratherapeutic as above but  denies obvious bleeding.  FOBT is negative. Holding Coumadin, repeat labs in a.m. Coumadin resumed at discharge.   Leukocytosis: WBC 15.1 on admit.  No obvious infection.   Suspect reactive in setting of large ecchymosis.   Fall at home: History suggestive of mechanical falls.   Ecchymosis to the left lateral thorax and posterior left upper arm otherwise no significant injury noted.  No LOC.   Lives alone and usually ambulates with cane or rollator.   Head CT, left hip x-ray, and CXR negative for traumatic injury. PT/OT eval, fall precautions   Type 2 diabetes: Holding home meds and placed on SSI.  Hypertension: Hold Toprol-XL, olmesartan, HCTZ as above. Resume blood pressure medications at discharge.   Chronic back pain: Continue Cymbalta and Norco as needed with hold parameters.  Discharge  Instructions  Discharge Instructions     Call MD for:  difficulty breathing, headache or visual disturbances   Complete by: As directed    Call MD for:  persistant dizziness or light-headedness   Complete by: As directed    Call MD for:  persistant nausea and vomiting   Complete by: As directed    Diet - low sodium heart healthy   Complete by: As directed    Diet Carb Modified   Complete by: As directed    Discharge instructions   Complete by: As directed    Advised to follow-up with primary care physician in 1 week. Advised to hold olmesartan and HCTZ as blood pressure has been running low.   Increase activity slowly   Complete by: As directed       Allergies as of 11/02/2023       Reactions   Penicillins Hives   Has patient had a PCN reaction causing immediate rash, facial/tongue/throat swelling, SOB or lightheadedness with hypotension: Hives Has patient had a PCN reaction causing severe rash involving mucus membranes or skin necrosis: Unk Has patient had a PCN reaction that required hospitalization: No Has patient had a PCN reaction occurring within the last 10 years: No If all of the above answers are "NO", then may proceed with Cephalosporin use.        Medication List     STOP taking these medications    olmesartan-hydrochlorothiazide 20-12.5 MG tablet Commonly known as: BENICAR HCT       TAKE these medications    amLODipine 5 MG tablet Commonly known as: NORVASC Take 5 mg by mouth daily.   aspirin 81 MG tablet Take 81 mg by mouth daily.   colesevelam 625 MG tablet Commonly known as: WELCHOL Take 1-3 tablets by mouth as needed (for colon).   cyanocobalamin 1000 MCG tablet Take 1,000 mcg by mouth daily.   DULoxetine 60 MG capsule Commonly known as: CYMBALTA Take 60 mg by mouth daily.   flecainide 150 MG tablet Commonly known as: TAMBOCOR TAKE 1/2 TABLET (75 MG TOTAL) BY MOUTH TWICE A DAY What changed: See the new instructions.    HYDROcodone-acetaminophen 5-325 MG tablet Commonly known as: NORCO/VICODIN Take 1 tablet by mouth every 4 (four) hours as needed.   Jentadueto 2.04-999 MG Tabs Generic drug: linaGLIPtin-metFORMIN HCl Take 1 tablet by mouth 2 (two) times daily.   metoprolol succinate 25 MG 24 hr tablet Commonly known as: TOPROL-XL Take 1 tablet (25 mg total) by mouth daily. What changed:  medication strength how much to take   warfarin 5 MG tablet Commonly known as: COUMADIN Take 0.5 tablets (2.5 mg total) by mouth daily at 4 PM. Take 1/2 tablet (2.5 mg total) by mouth daily at 4 PM or as directed by office What changed:  how much to take when to take this additional instructions   zolpidem 5 MG tablet Commonly known as: AMBIEN Take 5 mg by mouth daily.        Follow-up Information     Avva, Joylene Draft, MD Follow up on 11/03/2023.   Specialty: Internal Medicine Why: The office will call the patient.  please go to office on 11/27 to get INR check at 8:30 Contact information: [address] [place] [address] [phone]         Durwin Nora,  [facility] Follow up.   Specialty: Home Health Services Why: Agency will call you to set up apt times Contact information: [address] [place] [address] [phone]                Allergies  Allergen Reactions   Penicillins Hives    Has patient had a PCN reaction causing immediate rash, facial/tongue/throat swelling, SOB or lightheadedness with hypotension: Hives Has patient had a PCN reaction causing severe rash involving mucus membranes or skin necrosis: Unk Has patient had a PCN reaction that required hospitalization: No Has patient had a PCN reaction occurring within the last 10 years: No If all of the above answers are "NO", then may proceed with Cephalosporin use.     Consultations: None   Procedures/Studies: DG Hip Unilat W or Wo Pelvis 2-3 Views Left  Result Date:  10/30/2023 CLINICAL DATA:  Fall with left leg pain EXAM: DG HIP (WITH OR WITHOUT PELVIS) 2-3V LEFT COMPARISON:  11/24/2017 FINDINGS: No acute fracture or dislocation. Mild degenerative changes pubic symphysis, both hips, SI joints and lower lumbar spine. IMPRESSION: No acute fracture or dislocation. Electronically Signed   By: Minerva Fester M.D.   On: 10/30/2023 21:05   CT Head Wo Contrast  Result Date: 10/30/2023 CLINICAL DATA:  Repeated falls, on blood thinners EXAM: CT HEAD WITHOUT CONTRAST TECHNIQUE: Contiguous axial images were obtained from the base of the skull through the vertex without intravenous contrast. RADIATION DOSE REDUCTION: This exam was performed according to the departmental dose-optimization program which includes automated exposure control, adjustment of the mA and/or kV according to patient size and/or use of iterative reconstruction technique. COMPARISON:  08/09/2018 FINDINGS: Brain: No evidence of acute infarction, hemorrhage, mass, mass effect, or midline shift. No hydrocephalus or extra-axial fluid collection. Periventricular white matter changes, likely the sequela of chronic small vessel ischemic disease. Age related cerebral atrophy. 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 Dilated perivascular spaces versus lacunar infarcts in the right basal ganglia Vascular: No hyperdense vessel. Skull: Negative for fracture or focal lesion. Sinuses/Orbits: No acute finding. Status post bilateral lens replacements. Other: The mastoid air cells are well aerated. IMPRESSION: No acute intracranial process. Electronically Signed   By: Wiliam Ke M.D.   On: 10/30/2023 19:56   DG Chest Portable 1 View  Result Date: 10/30/2023 CLINICAL DATA:  Fall, rib pain EXAM: PORTABLE CHEST 1 VIEW COMPARISON:  11/24/2017 FINDINGS: Lungs are clear. No pneumothorax or pleural effusion. Cardiac size within normal limits. Pulmonary vascularity is normal. No acute bone abnormality. IMPRESSION: 1. No active  disease. Electronically Signed   By: Helyn Numbers M.D.   On: 10/30/2023 19:31     Subjective: Patient was seen and examined at bedside.  Overnight events noted.   Patient reports doing much better,  wants to be discharged.  INR is 2.0.  Resumed on Coumadin.   Patient is being discharged home.  Discharge Exam: Vitals:   11/02/23 0402 11/02/23 0719  BP: (!) 159/71 (!) 149/68  Pulse: 91 79  Resp: 17 19  Temp: 97.7 F (36.5 C) 98.4 F (36.9 C)  SpO2: 97% 98%   Vitals:   11/01/23 1935 11/02/23 0039 11/02/23 0402 11/02/23 0719  BP: (!) 131/53 (!) 165/68 (!) 159/71 (!) 149/68  Pulse: 77 95 91 79  Resp: 17 18 17 19   Temp: (!) 97.4 F (36.3 C) 98.4 F (36.9 C) 97.7 F (36.5 C) 98.4 F (36.9 C)  TempSrc: Oral Oral Oral Oral  SpO2: 97% 100% 97% 98%  Weight:   84.7 kg   Height:        General: Pt is alert, awake, not in acute distress Cardiovascular: RRR, S1/S2 +, no rubs, no gallops Respiratory: CTA bilaterally, no wheezing, no rhonchi Abdominal: Soft, NT, ND, bowel sounds + Extremities: no edema, no cyanosis    The results of significant diagnostics from this hospitalization (including imaging, microbiology, ancillary and laboratory) are listed below for reference.     Microbiology: No results found for this or any previous visit (from the past 240 hour(s)).   Labs: BNP (last 3 results) No results for input(s): "BNP" in the last 8760 hours. Basic Metabolic Panel: Recent Labs  Lab 10/30/23 1913 10/30/23 1941 10/31/23 0247 11/01/23 0233 11/02/23 0255  NA 134* 137 135 134* 136  K 4.1 4.2 4.2 4.3 4.1  CL 98 98 100 101 104  CO2 27  --  24 24 25   GLUCOSE 194* 191* 144* 136* 137*  BUN 44* 43* 42* 36* 29*  CREATININE 2.10* 2.10* 2.04* 1.54* 1.52*  CALCIUM 9.3  --  8.7* 9.0 8.6*  MG  --   --   --  1.7  --   PHOS  --   --   --  2.8  --    Liver Function Tests: Recent Labs  Lab 10/30/23 1913 10/31/23 0247  AST 30 28  ALT 29 25  ALKPHOS 49 50  BILITOT 1.5*  1.6*  PROT 6.7 5.7*  ALBUMIN 3.5 3.0*   No results for input(s): "LIPASE", "AMYLASE" in the last 168 hours. No results for input(s): "AMMONIA" in the last 168 hours. CBC: Recent Labs  Lab 10/30/23 1907 10/30/23 1941 10/31/23 0247 10/31/23 0857 11/01/23 0233 11/01/23 0905 11/02/23 0255  WBC 15.1*  --  11.0*  --  11.1*  --  9.4  HGB 8.5*   < > 7.2* 7.5* 7.3* 7.0* 8.4*  HCT 27.7*   < > 23.8* 24.0* 24.2* 22.5* 26.5*  MCV 95.8  --  94.4  --  96.4  --  93.0  PLT 391  --  317  --  309  --  312   < > = values in this interval not displayed.   Cardiac Enzymes: Recent Labs  Lab 10/30/23 1907 10/31/23 0247  CKTOTAL 294* 256*   BNP: Invalid input(s): "POCBNP" CBG: Recent Labs  Lab 11/01/23 0620 11/01/23 1115 11/01/23 1553 11/01/23 2047 11/02/23 0614  GLUCAP 136* 169* 153* 209* 136*   D-Dimer No results for input(s): "DDIMER" in the last 72 hours. Hgb A1c No results for input(s): "HGBA1C" in the last 72 hours. Lipid Profile No results for input(s): "CHOL", "HDL", "LDLCALC", "TRIG", "CHOLHDL", "LDLDIRECT" in the last 72 hours. Thyroid function studies No results for input(s): "TSH", "T4TOTAL", "T3FREE", "THYROIDAB" in the last 72 hours.  Invalid input(s): "FREET3" Anemia work up No results for input(s): "VITAMINB12", "FOLATE", "FERRITIN", "TIBC", "IRON", "RETICCTPCT" in the last 72 hours. Urinalysis    Component Value Date/Time   COLORURINE YELLOW 10/31/2023 1118   APPEARANCEUR CLEAR 10/31/2023 1118   LABSPEC 1.008 10/31/2023 1118   PHURINE 5.0 10/31/2023 1118   GLUCOSEU NEGATIVE 10/31/2023 1118   HGBUR NEGATIVE 10/31/2023 1118   BILIRUBINUR NEGATIVE 10/31/2023 1118   KETONESUR NEGATIVE 10/31/2023 1118   PROTEINUR NEGATIVE 10/31/2023 1118   NITRITE NEGATIVE 10/31/2023 1118   LEUKOCYTESUR NEGATIVE 10/31/2023 1118   Sepsis Labs Recent Labs  Lab 10/30/23 1907 10/31/23 0247 11/01/23 0233 11/02/23 0255  WBC 15.1* 11.0* 11.1*  9.4   Microbiology No results  found for this or any previous visit (from the past 240 hour(s)).   Time coordinating discharge: Over 30 minutes  SIGNED:   Willeen Niece, MD  [facility] 11/02/2023, 11:46 AM Pager   If 7PM-7AM, please contact night-coverage

## 2023-11-01 NOTE — Discharge Instructions (Signed)
Advised to follow-up with primary care physician in 1 week. Advised to hold olmesartan and HCTZ as blood pressure has been running low.

## 2023-11-02 DIAGNOSIS — N179 Acute kidney failure, unspecified: Secondary | ICD-10-CM | POA: Diagnosis not present

## 2023-11-02 DIAGNOSIS — N189 Chronic kidney disease, unspecified: Secondary | ICD-10-CM | POA: Diagnosis not present

## 2023-11-02 LAB — TYPE AND SCREEN
ABO/RH(D): AB POS
Antibody Screen: NEGATIVE
Unit division: 0

## 2023-11-02 LAB — CBC
HCT: 26.5 % — ABNORMAL LOW (ref 36.0–46.0)
Hemoglobin: 8.4 g/dL — ABNORMAL LOW (ref 12.0–15.0)
MCH: 29.5 pg (ref 26.0–34.0)
MCHC: 31.7 g/dL (ref 30.0–36.0)
MCV: 93 fL (ref 80.0–100.0)
Platelets: 312 10*3/uL (ref 150–400)
RBC: 2.85 MIL/uL — ABNORMAL LOW (ref 3.87–5.11)
RDW: 16 % — ABNORMAL HIGH (ref 11.5–15.5)
WBC: 9.4 10*3/uL (ref 4.0–10.5)
nRBC: 0 % (ref 0.0–0.2)

## 2023-11-02 LAB — BPAM RBC
Blood Product Expiration Date: 202412142359
ISSUE DATE / TIME: 202411251446
Unit Type and Rh: 8400

## 2023-11-02 LAB — BASIC METABOLIC PANEL
Anion gap: 7 (ref 5–15)
BUN: 29 mg/dL — ABNORMAL HIGH (ref 8–23)
CO2: 25 mmol/L (ref 22–32)
Calcium: 8.6 mg/dL — ABNORMAL LOW (ref 8.9–10.3)
Chloride: 104 mmol/L (ref 98–111)
Creatinine, Ser: 1.52 mg/dL — ABNORMAL HIGH (ref 0.44–1.00)
GFR, Estimated: 36 mL/min — ABNORMAL LOW (ref >60)
Glucose, Bld: 137 mg/dL — ABNORMAL HIGH (ref 70–99)
Potassium: 4.1 mmol/L (ref 3.5–5.1)
Sodium: 136 mmol/L (ref 135–145)

## 2023-11-02 LAB — PROTIME-INR
INR: 2 — ABNORMAL HIGH (ref 0.8–1.2)
Prothrombin Time: 22.6 s — ABNORMAL HIGH (ref 11.4–15.2)

## 2023-11-02 LAB — GLUCOSE, CAPILLARY: Glucose-Capillary: 136 mg/dL — ABNORMAL HIGH (ref 70–99)

## 2023-11-02 MED ORDER — WARFARIN SODIUM 5 MG PO TABS: 2.5000 mg | ORAL_TABLET | Freq: Every day | ORAL | Status: AC

## 2023-11-02 MED ORDER — WARFARIN SODIUM 2.5 MG PO TABS: 2.5000 mg | ORAL_TABLET | Freq: Once | ORAL | Status: DC

## 2023-11-02 NOTE — Progress Notes (Signed)
PHARMACY - ANTICOAGULATION CONSULT NOTE  Pharmacy Consult for warfarin Indication: atrial fibrillation  Allergies  Allergen Reactions   Penicillins Hives    Has patient had a PCN reaction causing immediate rash, facial/tongue/throat swelling, SOB or lightheadedness with hypotension: Hives Has patient had a PCN reaction causing severe rash involving mucus membranes or skin necrosis: Unk Has patient had a PCN reaction that required hospitalization: No Has patient had a PCN reaction occurring within the last 10 years: No If all of the above answers are "NO", then may proceed with Cephalosporin use.     Patient Measurements: Height: 5\' 4"  (162.6 cm) Weight: 84.7 kg (186 lb 12.8 oz) IBW/kg (Calculated) : 54.7 Heparin Dosing Weight: 72.9 kg   Vital Signs: Temp: 98.4 F (36.9 C) (11/26 0719) Temp Source: Oral (11/26 0719) BP: 149/68 (11/26 0719) Pulse Rate: 79 (11/26 0719)  Labs: Recent Labs    10/30/23 1907 10/30/23 1913 10/31/23 0247 10/31/23 0857 11/01/23 0233 11/01/23 0905 11/02/23 0255  HGB 8.5*   < > 7.2*   < > 7.3* 7.0* 8.4*  HCT 27.7*   < > 23.8*   < > 24.2* 22.5* 26.5*  PLT 391  --  317  --  309  --  312  LABPROT  --    < > 44.6*  --  24.3*  --  22.6*  INR  --    < > 4.7*  --  2.2*  --  2.0*  CREATININE  --    < > 2.04*  --  1.54*  --  1.52*  CKTOTAL 294*  --  256*  --   --   --   --   TROPONINIHS 7  --   --   --   --   --   --    < > = values in this interval not displayed.    Estimated Creatinine Clearance: 34.7 mL/min (A) (by C-G formula based on SCr of 1.52 mg/dL (H)).   Medical History: Past Medical History:  Diagnosis Date   Anxiety    Arthritis    Asthma    Bronchitis   Atherosclerosis 11/25/2017   noted on CT   Back pain    Depression    Diabetes mellitus    Diverticulosis 11/25/2017   mild, noted on CT   Fatty liver 11/25/2017   noted on CT   First degree AV block    History of kidney stones    Hydronephrosis 11/25/2017   mild left  noted on CT   Hyperlipidemia    Hypertension    PAF (paroxysmal atrial fibrillation) (HCC)    Renal cyst 11/2017   Left, noted on [place]   Right renal artery stenosis (HCC)    Sinus bradycardia    Skin cancer    questionable skin cancer on face years ago   UTI (urinary tract infection)     Medications:  Scheduled:   DULoxetine  60 mg Oral Daily   flecainide  75 mg Oral BID   insulin aspart  0-5 Units Subcutaneous QHS   insulin aspart  0-9 Units Subcutaneous TID WC   metoprolol succinate  25 mg Oral Daily   sodium chloride flush  3 mL Intravenous Q12H   Warfarin - Pharmacist Dosing Inpatient   Does not apply q1600    Assessment: 86 yof presenting with fall (CT head negative). On warfarin PTA for hx Afib - INR on admission was elevated at 6.7 requiring vitamin K 1 mg on 11/23.  PTA  regimen is warfarin 5 mg daily except 2.5 mg on Mon/Thurs (LD 11/23 AM).   INR today down to 2 (within goal range) after warfarin restarted yesterday. Hgb up to 8.4 after PRBC, plt 312. No s/sx of bleeding - stable bruising from fall. Discussed with MD, given INR therapeutic and has history of falls will plan for no enoxaparin bridge at discharge.   Discussed switching from warfarin to DOAC with patient; however, patient is in coverage gap with insurance and will not qualify for patient assistance.   Goal of Therapy:  INR 2-3 Monitor platelets by anticoagulation protocol: Yes   Plan:  Warfarin 2.5 mg tonight  Would discharge on reduced regimen from PTA of warfarin 2.5 mg daily  Monitor daily INR, CBC, and for s/sx of bleeding Plan for INR appointment on Wednesday, December 4th  Thank you for allowing pharmacy to participate in this patient's care,  Sherron Monday, PharmD, BCCCP Clinical Pharmacist  Phone: [phone] 11/02/2023 7:43 AM  Please check AMION for all [facility] Pharmacy phone numbers After 10:00 PM, call Main Pharmacy [phone]

## 2023-11-12 ENCOUNTER — Other Ambulatory Visit: Payer: Self-pay | Admitting: Adult Health

## 2023-11-12 DIAGNOSIS — N6311 Unspecified lump in the right breast, upper outer quadrant: Secondary | ICD-10-CM

## 2023-12-15 ENCOUNTER — Emergency Department (HOSPITAL_COMMUNITY): Payer: Medicare Other

## 2023-12-15 ENCOUNTER — Emergency Department (HOSPITAL_COMMUNITY)
Admission: EM | Admit: 2023-12-15 | Discharge: 2023-12-16 | Disposition: A | Discharge status: Home / Self Care | Payer: Medicare Other | Attending: Emergency Medicine | Admitting: Emergency Medicine

## 2023-12-15 ENCOUNTER — Encounter (HOSPITAL_COMMUNITY): Payer: Self-pay | Admitting: *Deleted

## 2023-12-15 ENCOUNTER — Other Ambulatory Visit: Payer: Self-pay

## 2023-12-15 DIAGNOSIS — W19XXXA Unspecified fall, initial encounter: Secondary | ICD-10-CM | POA: Insufficient documentation

## 2023-12-15 DIAGNOSIS — Y92000 Kitchen of unspecified non-institutional (private) residence as  the place of occurrence of the external cause: Secondary | ICD-10-CM | POA: Insufficient documentation

## 2023-12-15 DIAGNOSIS — S00531A Contusion of lip, initial encounter: Secondary | ICD-10-CM

## 2023-12-15 DIAGNOSIS — Z7982 Long term (current) use of aspirin: Secondary | ICD-10-CM | POA: Insufficient documentation

## 2023-12-15 DIAGNOSIS — E876 Hypokalemia: Secondary | ICD-10-CM | POA: Diagnosis not present

## 2023-12-15 DIAGNOSIS — M545 Low back pain, unspecified: Secondary | ICD-10-CM | POA: Insufficient documentation

## 2023-12-15 DIAGNOSIS — Z7901 Long term (current) use of anticoagulants: Secondary | ICD-10-CM | POA: Insufficient documentation

## 2023-12-15 DIAGNOSIS — Z79899 Other long term (current) drug therapy: Secondary | ICD-10-CM | POA: Diagnosis not present

## 2023-12-15 DIAGNOSIS — S0990XA Unspecified injury of head, initial encounter: Secondary | ICD-10-CM | POA: Diagnosis not present

## 2023-12-15 DIAGNOSIS — R0781 Pleurodynia: Secondary | ICD-10-CM | POA: Diagnosis not present

## 2023-12-15 DIAGNOSIS — S00501A Unspecified superficial injury of lip, initial encounter: Secondary | ICD-10-CM | POA: Diagnosis present

## 2023-12-15 LAB — CBC WITH DIFFERENTIAL/PLATELET
Abs Immature Granulocytes: 0.09 10*3/uL — ABNORMAL HIGH (ref 0.00–0.07)
Basophils Absolute: 0.1 10*3/uL (ref 0.0–0.1)
Basophils Relative: 1 %
Eosinophils Absolute: 0.1 10*3/uL (ref 0.0–0.5)
Eosinophils Relative: 1 %
HCT: 42.6 % (ref 36.0–46.0)
Hemoglobin: 13.7 g/dL (ref 12.0–15.0)
Immature Granulocytes: 1 %
Lymphocytes Relative: 8 %
Lymphs Abs: 1 10*3/uL (ref 0.7–4.0)
MCH: 28.6 pg (ref 26.0–34.0)
MCHC: 32.2 g/dL (ref 30.0–36.0)
MCV: 88.9 fL (ref 80.0–100.0)
Monocytes Absolute: 0.6 10*3/uL (ref 0.1–1.0)
Monocytes Relative: 5 %
Neutro Abs: 10.3 10*3/uL — ABNORMAL HIGH (ref 1.7–7.7)
Neutrophils Relative %: 84 %
Platelets: 416 10*3/uL — ABNORMAL HIGH (ref 150–400)
RBC: 4.79 MIL/uL (ref 3.87–5.11)
RDW: 13.5 % (ref 11.5–15.5)
WBC: 12.1 10*3/uL — ABNORMAL HIGH (ref 4.0–10.5)
nRBC: 0 % (ref 0.0–0.2)

## 2023-12-15 LAB — BASIC METABOLIC PANEL
Anion gap: 16 — ABNORMAL HIGH (ref 5–15)
BUN: 21 mg/dL (ref 8–23)
CO2: 25 mmol/L (ref 22–32)
Calcium: 9.3 mg/dL (ref 8.9–10.3)
Chloride: 95 mmol/L — ABNORMAL LOW (ref 98–111)
Creatinine, Ser: 1.44 mg/dL — ABNORMAL HIGH (ref 0.44–1.00)
GFR, Estimated: 38 mL/min — ABNORMAL LOW (ref >60)
Glucose, Bld: 197 mg/dL — ABNORMAL HIGH (ref 70–99)
Potassium: 2.7 mmol/L — CL (ref 3.5–5.1)
Sodium: 136 mmol/L (ref 135–145)

## 2023-12-15 LAB — PROTIME-INR
INR: 2.3 — ABNORMAL HIGH (ref 0.8–1.2)
Prothrombin Time: 25.6 s — ABNORMAL HIGH (ref 11.4–15.2)

## 2023-12-15 LAB — CK: Total CK: 48 U/L (ref 38–234)

## 2023-12-15 MED ORDER — POTASSIUM CHLORIDE CRYS ER 20 MEQ PO TBCR
60.0000 meq | EXTENDED_RELEASE_TABLET | Freq: Once | ORAL | Status: AC
  Administered 2023-12-16: 60 meq via ORAL
  Filled 2023-12-15: qty 3

## 2023-12-15 NOTE — ED Notes (Signed)
 Port xrays  now going to c-t

## 2023-12-15 NOTE — Progress Notes (Signed)
 Chaplain responds to level 2 and provides compassionate presence and support to pt, who does not anticipate friends or family arriving.

## 2023-12-15 NOTE — Progress Notes (Signed)
 Orthopedic Tech Progress Note Patient Details:  Peggy Chen Jan 28, 1951 [id number] Responded to level 2 trauma, not needed at this time  Patient ID: Peggy Chen, female   DOB: 1951/05/01, 73 y.o.   MRN: [id number]  Peggy Chen December 12/15/2023, 8:08 PM

## 2023-12-15 NOTE — ED Notes (Signed)
Mag added to labs.

## 2023-12-15 NOTE — ED Notes (Signed)
 Trauma Response Nurse Documentation   Peggy Chen is a 73 y.o. female arriving to [facility] ED via EMS  On warfarin daily. Trauma was activated as a Level 2 by Andrea Naomi PEAK based on the following trauma criteria Elderly patients > 65 with head trauma on anti-coagulation (excluding ASA).  Patient cleared for CT by Dr. Bari. Pt transported to CT with trauma response nurse present to monitor. RN remained with the patient throughout their absence from the department for clinical observation.   GCS 15.  Trauma MD Arrival Time: N/A.  History   Past Medical History:  Diagnosis Date   Anxiety    Arthritis    Asthma    Bronchitis   Atherosclerosis 11/25/2017   noted on CT   Back pain    Depression    Diabetes mellitus    Diverticulosis 11/25/2017   mild, noted on CT   Fatty liver 11/25/2017   noted on CT   First degree AV block    History of kidney stones    Hydronephrosis 11/25/2017   mild left noted on CT   Hyperlipidemia    Hypertension    PAF (paroxysmal atrial fibrillation) (HCC)    Renal cyst 11/2017   Left, noted on US    Right renal artery stenosis (HCC)    Sinus bradycardia    Skin cancer    questionable skin cancer on face years ago   UTI (urinary tract infection)      Past Surgical History:  Procedure Laterality Date   BREAST CYST REMOVAL     RIGHT BREAST   CARDIOVERSION N/A 04/17/2014   Procedure: CARDIOVERSION;  Surgeon: Aleene JINNY Passe, MD;  [facility] ENDOSCOPY;  Service: Cardiovascular;  Laterality: N/A;   CARDIOVERSION  05/2014   attempted DC failed   COLON SURGERY     May 2000   COLONOSCOPY     CYSTOSCOPY/URETEROSCOPY/HOLMIUM LASER/STENT PLACEMENT Left 11/26/2017   Procedure: CYSTOSCOPY/STENT PLACEMENT;  Surgeon: Renda Glance, MD;  Location: [facility] ORS;  Service: Urology;  Laterality: Left;   CYSTOSCOPY/URETEROSCOPY/HOLMIUM LASER/STENT PLACEMENT Left 12/23/2017   Procedure: CYSTOSCOPY/ LEFT URETEROSCOPY;  Surgeon: Renda Glance, MD;   Location: [facility] ORS;  Service: Urology;  Laterality: Left;   TONSILLECTOMY     US  ECHOCARDIOGRAPHY  12/30/2009   EF 55-60%       Initial Focused Assessment (If applicable, or please see trauma documentation): Airway-- intact, no visible obstruction Breathing-- spontaneous, unlabored Circulation-- no obvious bleeding noted  CT's Completed:   CT Head, CT Maxillofacial, and CT C-Spine, CT T-spine, CT L-spine   Interventions:  See event summary  Plan for disposition:  Discharge home   Consults completed:  none at 0045.  Event Summary: Patient brought in by PTAR. Patient with a mechanical fall this evening. She reports striking the back of her head and biting her lower lip. Dried blood present on lip. Patient main complaint of rib pain. On arrival, Manual BP obtained. 20 G PIV RAC established. Lab work completed. Xray chest and pelvis completed. Patient to CT with TRN. CT head, c-spine, maxillofacial, L-spine, T-spine completed.  MTP Summary (If applicable):  N/A  Bedside handoff with ED RN [place].    Bernardino Mayotte  Trauma Response RN  Please call TRN at [phone] for further assistance.

## 2023-12-15 NOTE — ED Triage Notes (Signed)
 The pt arrived by gems after a fall 2 hours ago at home  she lives alone and usually walks with a walker or rolador  she takes  a blood thinner and struck the back of her head and bit her lower lip  no bleeding at present also c/o pain in her lower spine a and o x 4

## 2023-12-15 NOTE — ED Provider Notes (Signed)
 [place] EMERGENCY DEPARTMENT AT [facility] Provider Note   CSN: [id number] Arrival date & time: 12/15/23  1958     History  Chief Complaint  Patient presents with   Trauma    Peggy Chen is a 73 y.o. female.  HPI   73 year old female presents emergency department as a level 2 trauma.  She had a mechanical fall in her kitchen with head injury, no LOC.  She is anticoagulated on Coumadin  for history of atrial fibrillation.  Patient was on the ground for about an hour until ambulance arrived.  She is complaining of right-sided head and facial discomfort as well as acute on chronic mid and lower spine pain. Denies specific neck pain at this time.  She also has pain in her right ribs from her previous fall, cannot tell if this is acutely worsened.  She has swollen upper and lower lips with dried blood around her mouth.  She denies any syncope.  She is otherwise been compliant with her medications and denies any other acute change in her health.  Home Medications Prior to Admission medications   Medication Sig Start Date End Date Taking? Authorizing Provider  amLODipine  (NORVASC ) 5 MG tablet Take 5 mg by mouth daily. 08/11/16   [provider]  aspirin  81 MG tablet Take 81 mg by mouth daily.  01/05/11   [provider]  colesevelam  (WELCHOL ) 625 MG tablet Take 1-3 tablets by mouth as needed (for colon). 03/28/20   [provider]  cyanocobalamin 1000 MCG tablet Take 1,000 mcg by mouth daily.     [provider]  DULoxetine  (CYMBALTA ) 60 MG capsule Take 60 mg by mouth daily.    [provider]  flecainide  (TAMBOCOR ) 150 MG tablet TAKE 1/2 TABLET (75 MG TOTAL) BY MOUTH TWICE A DAY Patient taking differently: Take 75 mg by mouth 2 (two) times daily. 02/25/23   Nahser, Aleene PARAS, MD  HYDROcodone -acetaminophen  (NORCO/VICODIN) 5-325 MG tablet Take 1 tablet by mouth every 4 (four) hours as needed. 12/16/21   Dean Clarity, MD  JENTADUETO  2.04-999 MG TABS Take 1 tablet by mouth 2 (two) times daily. 09/02/19   [provider]  metoprolol  succinate (TOPROL -XL) 25 MG 24 hr tablet Take 1 tablet (25 mg total) by mouth daily. 11/02/23 12/02/23  Leotis Bogus, MD  warfarin (COUMADIN ) 5 MG tablet Take 0.5 tablets (2.5 mg total) by mouth daily at 4 PM. Take 1/2 tablet (2.5 mg total) by mouth daily at 4 PM or as directed by office 11/02/23   Leotis Bogus, MD  zolpidem  (AMBIEN ) 5 MG tablet Take 5 mg by mouth daily. 05/27/20   [provider]      Allergies    Penicillins    Review of Systems   Review of Systems  Constitutional:  Negative for fever.  HENT:  Positive for facial swelling. Negative for dental problem.        Lip injury  Respiratory:  Negative for shortness of breath.   Cardiovascular:  Negative for chest pain.       + right rib pain  Gastrointestinal:  Negative for abdominal pain, diarrhea and vomiting.  Musculoskeletal:  Positive for back pain. Negative for neck pain.  Skin:  Negative for rash.  Neurological:  Positive for headaches.    Physical Exam Updated Vital Signs BP (!) 145/63   Pulse 60   Temp 98 F (36.7 C)   Resp 18   Ht 5' 4 (1.626 m)   Wt  84.7 kg   SpO2 99%   BMI 32.05 kg/m  Physical Exam Vitals and nursing note reviewed.  Constitutional:      General: She is not in acute distress.    Appearance: Normal appearance.  HENT:     Head: Normocephalic.     Right Ear: External ear normal.     Left Ear: External ear normal.     Mouth/Throat:     Mouth: Mucous membranes are moist.     Comments: Upper and lower lip swelling with dried blood.  It teeth are intact and stable, tongue has dried blood but no acute injury, there is a punctate injury to the inside of the lower lip with small amount of bleeding Eyes:     Pupils: Pupils are equal, round, and reactive to light.  Cardiovascular:     Rate and Rhythm: Normal rate.  Pulmonary:     Effort: Pulmonary effort is normal. No  respiratory distress.     Comments: Mild tenderness to palpation of the right ribs without crepitus Abdominal:     Palpations: Abdomen is soft.     Tenderness: There is no abdominal tenderness.  Musculoskeletal:     Cervical back: No tenderness.     Comments: No traumatic injury noted in the extremities, pelvis stable and nontender  Skin:    General: Skin is warm.  Neurological:     Mental Status: She is alert and oriented to person, place, and time. Mental status is at baseline.  Psychiatric:        Mood and Affect: Mood normal.     ED Results / Procedures / Treatments   Labs (all labs ordered are listed, but only abnormal results are displayed) Labs Reviewed  CBC WITH DIFFERENTIAL/PLATELET  BASIC METABOLIC PANEL  PROTIME-INR    EKG None  Radiology No results found.  Procedures .Critical Care  Performed by: Bari Roxie HERO, DO Authorized by: Bari Roxie HERO, DO   Critical care provider statement:    Critical care time (minutes):  30   Critical care time was exclusive of:  Separately billable procedures and treating other patients   Critical care was necessary to treat or prevent imminent or life-threatening deterioration of the following conditions:  Trauma   Critical care was time spent personally by me on the following activities:  Development of treatment plan with patient or surrogate, discussions with consultants, evaluation of patient's response to treatment, examination of patient, ordering and review of laboratory studies, ordering and review of radiographic studies, ordering and performing treatments and interventions, pulse oximetry, re-evaluation of patient's condition and review of old charts   I assumed direction of critical care for this patient from another provider in my specialty: no     Care discussed with: admitting provider       Medications Ordered in ED Medications - No data to display  ED Course/ Medical Decision Making/ A&P                                  Medical Decision Making Amount and/or Complexity of Data Reviewed Labs: ordered. Radiology: ordered.  Risk OTC drugs. Prescription drug management.   73 year old female presents emergency department as a level 2 trauma, fall on blood thinners, Coumadin .  Has obvious lip swelling/facial trauma.  Vitals are normal stable on arrival.  CT imaging of the head, face and cervical spine is reassuring.  The thoracic spine shows no acute  fracture, lumbar spine shows degenerative changes and some canal stenosis.  Patient has a scheduled appointment to follow-up with neurosurgery.  Blood work is reassuring.  Shows mild hypokalemia and hypomagnesia him.  EKG is normal sinus rhythm.  It mentions a prolonged QTc, I believe that this is incorrectly measured when considering the end of the T wave.  There were no significant U waves to [place] can to account for.  She has also had a prolonged QTc on a previous EKG which I believe was not correctly measured either.  If anything this is chronic and not acutely related to potassium.  Patient is tolerating p.o.  We discussed oral replacement here in the department and continuing her on potassium and magnesium  supplements for the next couple days.  She is also been instructed to follow-up with her primary doctor next week for repeat blood work.  She understands.  Do not feel she warrants emergent admission in regards to the potassium/magnesium .  Trauma workup is otherwise negative.  Patient at this time appears safe and stable for discharge and close outpatient follow up. Discharge plan and strict return to ED precautions discussed, patient verbalizes understanding and agreement.        Final Clinical Impression(s) / ED Diagnoses Final diagnoses:  None    Rx / DC Orders ED Discharge Orders     None         Bari Roxie HERO, DO 12/16/23 0025

## 2023-12-15 NOTE — ED Notes (Signed)
 The pt returned from xray again

## 2023-12-16 LAB — MAGNESIUM: Magnesium: 1.4 mg/dL — ABNORMAL LOW (ref 1.7–2.4)

## 2023-12-16 MED ORDER — MAGNESIUM CHLORIDE 64 MG PO TBEC
1.0000 | DELAYED_RELEASE_TABLET | Freq: Once | ORAL | Status: AC
  Administered 2023-12-16: 64 mg via ORAL
  Filled 2023-12-16: qty 1

## 2023-12-16 MED ORDER — MAGNESIUM 30 MG PO TABS: 30.0000 mg | ORAL_TABLET | Freq: Every day | ORAL | 0 refills | Status: DC

## 2023-12-16 MED ORDER — POTASSIUM CHLORIDE CRYS ER 20 MEQ PO TBCR: 40.0000 meq | EXTENDED_RELEASE_TABLET | Freq: Every day | ORAL | 0 refills | Status: DC

## 2023-12-16 MED ORDER — ACETAMINOPHEN 325 MG PO TABS
650.0000 mg | ORAL_TABLET | Freq: Once | ORAL | Status: AC
  Administered 2023-12-16: 650 mg via ORAL
  Filled 2023-12-16: qty 2

## 2023-12-16 NOTE — Discharge Instructions (Signed)
 You have been seen and discharged from the emergency department.  You sustained a small cut of your inner lip, this should heal on its own.  If needed you may apply pressure with fluid soaked gauze.  The rest of your CT and x-ray imaging showed no acute fracture or injury from the fall.  Your blood work showed that your INR is 2.3, which is therapeutic.  The only other abnormality was a slightly low potassium and magnesium .  You were given oral replacement in the ER.  You have been sent a prescription for a couple more days of potassium and magnesium  replacement.  It is extremely important that you follow-up for repeat blood work next week to ensure that these levels have normalized.  Follow-up with your primary provider for further evaluation and further care. Take home medications as prescribed. If you have any worsening symptoms or further concerns for your health please return to an emergency department for further evaluation.

## 2023-12-29 ENCOUNTER — Other Ambulatory Visit: Payer: Medicare Other

## 2024-01-14 ENCOUNTER — Telehealth: Payer: Self-pay | Admitting: Nurse Practitioner

## 2024-01-14 NOTE — Telephone Encounter (Signed)
 Spoke with pt and went over medication dosing and instructions for Flecainide  and Metoprolol . All questions were answered. Pt asked to be scheduled for 1 year follow-up (last OV note did state pt needed a 6 month f/u). F/U appt made for 4/1 and pt is aware

## 2024-01-14 NOTE — Telephone Encounter (Signed)
 Pt c/o medication issue:  1. Name of Medication: Metoprolol  and Flecainide   2. How are you currently taking this medication (dosage and times per day)?   3. Are you having a reaction (difficulty breathing--STAT)?   4. What is your medication issue?  She needs to know the correct milligram for these medicine and correct directions

## 2024-02-24 ENCOUNTER — Other Ambulatory Visit: Payer: Self-pay | Admitting: Cardiovascular Disease

## 2024-03-06 ENCOUNTER — Encounter: Payer: Self-pay | Admitting: Cardiovascular Disease

## 2024-03-06 NOTE — Progress Notes (Unsigned)
 Peggy Chen Date of Birth  [birth year]       [facility] [address]  [address], [address] [place], [place]  [address]   [address], [place]  [address] [phone]     [phone]   Fax  [phone]    Fax [phone]  Problem List: 1. Intermittent atrial fibrillation 2. Diabetes mellitus 3. Hyperlipidemia 4. Hypertension     73 year old female with a history of intermittent atrial fibrillation. She also has a history of diabetes mellitus, hyperlipidemia, and hypertension. She still occasionally has episodes of A-Fib that last 1-3 days.  They can be as short as 3 hours.  She takes propranolol which seems to help.  She is on coumadin.   She does not exercise much at all.  She actually feels better when she is active.    February 26, 2014:  Still having some atrial fib.  On coumadin.    May 16, 2014: We attempted cardioversion [place] on Apr 17, 2014.  It was not successful. Since that time, we have started her on flecainide. [name] converted to sinus rhythm with the flecainide.  Sept. 19, 2017:  Has had many life changes Lost her husband Dec. 2015 Daughter is in jail for heroin   She is maintaining NSR Is on flecainide.  Has occasional afib episodes if she misses a dose.  November 07, 2018: Seen back today after 2-year absence. She was seen by Chelsea Aus last year .    She has had some ortho surgery since I last saw her   Has been falling  Has fallen 3 times over the past year.  Mostly due lack of balance  Ive suggested she discuss with her primary MD - may need a neuro consult  Has lost lots of strength in her legs.   Feb. 10, 2021  Doing ok .  Occasional palps . Not getting any exercise.   No CP or dyspnea.  Still using a cane for balance.   No further falls this year.  Going for her labs tomorrow for her physical  Wt is 210 lbs.    July 19, 2020:   Peggy Chen is seen today for follow-up visit.  She has  a history of paroxysmal atrial fibrillation.    Dr. Felipa Eth noticed a slight increase in her creatinine several months ago and ordered a renal artery duplex scan.  This revealed a right renal artery stenosis.  I reviewed the scans with Dr. Kirke Corin.  He determined that was only a moderate degree of stenosis and can be followed clinically for now.  Her blood pressure has been fairly well controlled.  She does not have any signs or symptoms of CHF.  Lipids look good  Has not been exercising Has lost muscle mass ,  Difficult to stand for long periods of times  We discussed the importance of regular exercise    Feb. 9, 2024 Peggy Chen is seen today for follow up of her PAF , HTN Renal artery stenosis   Had several nights of atrial fib last week  Ate more sweets last week   Wt is 190 lbs  Is not able to work out  Limited by back pain    March 07, 2024 Peggy Chen is seen for follow up of her PAF, HTN, renal artery stenosis  Wt is 168.   ( Down 22 lbs from last year)  Having some back pain  Seen Dr. Ethelene Hal in the  pain clinic    Current Outpatient Medications on File Prior to Visit  Medication Sig Dispense Refill   amLODipine (NORVASC) 5 MG tablet Take 5 mg by mouth daily.     aspirin 81 MG tablet Take 81 mg by mouth daily.      colesevelam (WELCHOL) 625 MG tablet Take 1-3 tablets by mouth as needed (for colon).     cyanocobalamin 1000 MCG tablet Take 1,000 mcg by mouth daily.      DULoxetine (CYMBALTA) 60 MG capsule Take 60 mg by mouth daily.     flecainide (TAMBOCOR) 150 MG tablet TAKE 1/2 TABLET (75 MG TOTAL) BY MOUTH TWICE A DAY 30 tablet 0   HYDROcodone-acetaminophen (NORCO) 10-325 MG tablet Take 1 tablet by mouth in the morning, at noon, in the evening, and at bedtime.     JENTADUETO 2.04-999 MG TABS Take 1 tablet by mouth 2 (two) times daily.     warfarin (COUMADIN) 5 MG tablet Take 0.5 tablets (2.5 mg total) by mouth daily at 4 PM. Take 1/2 tablet (2.5 mg total) by mouth daily at 4 PM or as  directed by office (Patient taking differently: Take 2.5-5 mg by mouth daily at 4 PM. Take 5 mg by mouth on Mondays and Wednesdays, then take 1/2 tablet (2.5 mg total) by mouth rest of days per patient)     zolpidem (AMBIEN) 5 MG tablet Take 5 mg by mouth at bedtime.     magnesium 30 MG tablet Take 1 tablet (30 mg total) by mouth daily. (Patient not taking: Reported on 03/07/2024) 6 tablet 0   metoprolol succinate (TOPROL-XL) 25 MG 24 hr tablet Take 1 tablet (25 mg total) by mouth daily. (Patient not taking: Reported on 03/07/2024) 30 tablet 0   potassium chloride SA (KLOR-CON M) 20 MEQ tablet Take 2 tablets (40 mEq total) by mouth daily. (Patient not taking: Reported on 03/07/2024) 6 tablet 0   No current facility-administered medications on file prior to visit.    Allergies  Allergen Reactions   Penicillins Hives    Has patient had a PCN reaction causing immediate rash, facial/tongue/throat swelling, SOB or lightheadedness with hypotension: Hives Has patient had a PCN reaction causing severe rash involving mucus membranes or skin necrosis: Unk Has patient had a PCN reaction that required hospitalization: No Has patient had a PCN reaction occurring within the last 10 years: No If all of the above answers are "NO", then may proceed with Cephalosporin use.     Past Medical History:  Diagnosis Date   Anxiety    Arthritis    Asthma    Bronchitis   Atherosclerosis 11/25/2017   noted on CT   Back pain    Depression    Diabetes mellitus    Diverticulosis 11/25/2017   mild, noted on CT   Fatty liver 11/25/2017   noted on CT   First degree AV block    History of kidney stones    Hydronephrosis 11/25/2017   mild left noted on CT   Hyperlipidemia    Hypertension    PAF (paroxysmal atrial fibrillation) (HCC)    Renal cyst 11/2017   Left, noted on [place]   Right renal artery stenosis (HCC)    Sinus bradycardia    Skin cancer    questionable skin cancer on face years ago   UTI (urinary tract  infection)     Past Surgical History:  Procedure Laterality Date   BREAST CYST REMOVAL     RIGHT BREAST  CARDIOVERSION N/A 04/17/2014   Procedure: CARDIOVERSION;  Surgeon: Vesta Mixer, MD;  Location: [facility] ENDOSCOPY;  Service: Cardiovascular;  Laterality: N/A;   CARDIOVERSION  05/2014   attempted DC failed   COLON SURGERY     May 2000   COLONOSCOPY     CYSTOSCOPY/URETEROSCOPY/HOLMIUM LASER/STENT PLACEMENT Left 11/26/2017   Procedure: CYSTOSCOPY/STENT PLACEMENT;  Surgeon: Heloise Purpura, MD;  Location: [facility] ORS;  Service: Urology;  Laterality: Left;   CYSTOSCOPY/URETEROSCOPY/HOLMIUM LASER/STENT PLACEMENT Left 12/23/2017   Procedure: CYSTOSCOPY/ LEFT URETEROSCOPY;  Surgeon: Heloise Purpura, MD;  Location: [facility] ORS;  Service: Urology;  Laterality: Left;   TONSILLECTOMY     US ECHOCARDIOGRAPHY  12/30/2009   EF 55-60%    Social History   Tobacco Use  Smoking Status Former   Current packs/day: 0.00   Types: Cigarettes   Quit date: 06/22/1986   Years since quitting: 37.7  Smokeless Tobacco Never    Social History   Substance and Sexual Activity  Alcohol Use Yes   Comment: occasional    Family History  Problem Relation Age of Onset   COPD Mother    Heart attack Father     Reviw of Systems:  Reviewed in the HPI.  All other systems are negative.    Physical Exam: Blood pressure 110/70, pulse 74, height 5\' 4"  (1.626 m), weight 168 lb 6.4 oz (76.4 kg), SpO2 96%.       GEN:  Well nourished, well developed in no acute distress HEENT: Normal NECK: No JVD; No carotid bruits LYMPHATICS: No lymphadenopathy CARDIAC: RRR , no murmurs, rubs, gallops RESPIRATORY:  Clear to auscultation without rales, wheezing or rhonchi  ABDOMEN: Soft, non-tender, non-distended MUSCULOSKELETAL:  No edema; No deformity  SKIN: Warm and dry NEUROLOGIC:  Alert and oriented x 3     ECG:         Assessment / Plan:   1. Intermittent atrial fibrillation:   Continue flecainide .  metoprolol Continue coumadin   2. Diabetes mellitus-     3. Hyperlipidemia-  she will have her labs checked at Dr. Vicente Males office in several weeks   4. Hypertension:  Bp is well controlled.       5.  Generalized weakness:     Kristeen Miss, MD  03/07/2024 2:37 PM    [facility] [address] [place],  [address] [place], [place]  [address] Pager [phone] Phone: [phone]; Fax: [phone]

## 2024-03-07 ENCOUNTER — Ambulatory Visit: Payer: Medicare Other | Attending: Cardiovascular Disease | Admitting: Cardiovascular Disease

## 2024-03-07 ENCOUNTER — Encounter: Payer: Self-pay | Admitting: Cardiovascular Disease

## 2024-03-07 VITALS — BP 110/70 | HR 74 | Ht 64.0 in | Wt 168.4 lb

## 2024-03-07 DIAGNOSIS — I480 Paroxysmal atrial fibrillation: Secondary | ICD-10-CM | POA: Diagnosis not present

## 2024-03-07 DIAGNOSIS — E782 Mixed hyperlipidemia: Secondary | ICD-10-CM

## 2024-03-07 DIAGNOSIS — I10 Essential (primary) hypertension: Secondary | ICD-10-CM | POA: Diagnosis not present

## 2024-03-07 NOTE — Patient Instructions (Signed)
 Follow-Up: At [facility], you and your health needs are our priority.  As part of our continuing mission to provide you with exceptional heart care, our providers are all part of one team.  This team includes your primary Cardiologist (physician) and Advanced Practice Providers or APPs (Physician Assistants and Nurse Practitioners) who all work together to provide you with the care you need, when you need it.  Your next appointment:   1 year(s)  Provider:   Kristeen Miss, MD     1st Floor: - Lobby - Registration  - Pharmacy  - Lab - Cafe  2nd Floor: - PV Lab - Diagnostic Testing (echo, CT, nuclear med)  3rd Floor: - Vacant  4th Floor: - TCTS (cardiothoracic surgery) - AFib Clinic - Structural Heart Clinic - Vascular Surgery  - Vascular Ultrasound  5th Floor: - HeartCare Cardiology (general and EP) - Clinical Pharmacy for coumadin, hypertension, lipid, weight-loss medications, and med management appointments    Valet parking services will be available as well.

## 2024-03-20 ENCOUNTER — Other Ambulatory Visit: Payer: Self-pay | Admitting: Cardiovascular Disease

## 2024-04-24 ENCOUNTER — Emergency Department (HOSPITAL_COMMUNITY)
Admission: EM | Admit: 2024-04-24 | Discharge: 2024-04-24 | Disposition: A | Discharge status: Home / Self Care | Attending: Emergency Medicine | Admitting: Emergency Medicine

## 2024-04-24 ENCOUNTER — Other Ambulatory Visit: Payer: Self-pay

## 2024-04-24 ENCOUNTER — Emergency Department (HOSPITAL_COMMUNITY)

## 2024-04-24 DIAGNOSIS — W108XXA Fall (on) (from) other stairs and steps, initial encounter: Secondary | ICD-10-CM | POA: Insufficient documentation

## 2024-04-24 DIAGNOSIS — Z79899 Other long term (current) drug therapy: Secondary | ICD-10-CM | POA: Diagnosis not present

## 2024-04-24 DIAGNOSIS — Y9301 Activity, walking, marching and hiking: Secondary | ICD-10-CM | POA: Insufficient documentation

## 2024-04-24 DIAGNOSIS — S0003XA Contusion of scalp, initial encounter: Secondary | ICD-10-CM | POA: Insufficient documentation

## 2024-04-24 DIAGNOSIS — Z7982 Long term (current) use of aspirin: Secondary | ICD-10-CM | POA: Insufficient documentation

## 2024-04-24 DIAGNOSIS — S61412A Laceration without foreign body of left hand, initial encounter: Secondary | ICD-10-CM | POA: Insufficient documentation

## 2024-04-24 DIAGNOSIS — Z7901 Long term (current) use of anticoagulants: Secondary | ICD-10-CM | POA: Diagnosis not present

## 2024-04-24 DIAGNOSIS — Z85828 Personal history of other malignant neoplasm of skin: Secondary | ICD-10-CM | POA: Diagnosis not present

## 2024-04-24 DIAGNOSIS — E119 Type 2 diabetes mellitus without complications: Secondary | ICD-10-CM | POA: Diagnosis not present

## 2024-04-24 DIAGNOSIS — Y92009 Unspecified place in unspecified non-institutional (private) residence as the place of occurrence of the external cause: Secondary | ICD-10-CM | POA: Diagnosis not present

## 2024-04-24 DIAGNOSIS — J45909 Unspecified asthma, uncomplicated: Secondary | ICD-10-CM | POA: Insufficient documentation

## 2024-04-24 DIAGNOSIS — S0990XA Unspecified injury of head, initial encounter: Secondary | ICD-10-CM

## 2024-04-24 DIAGNOSIS — Z87891 Personal history of nicotine dependence: Secondary | ICD-10-CM | POA: Insufficient documentation

## 2024-04-24 DIAGNOSIS — S6992XA Unspecified injury of left wrist, hand and finger(s), initial encounter: Secondary | ICD-10-CM | POA: Diagnosis present

## 2024-04-24 DIAGNOSIS — I10 Essential (primary) hypertension: Secondary | ICD-10-CM | POA: Insufficient documentation

## 2024-04-24 DIAGNOSIS — W19XXXA Unspecified fall, initial encounter: Secondary | ICD-10-CM

## 2024-04-24 LAB — CBC WITH DIFFERENTIAL/PLATELET
Abs Immature Granulocytes: 0.04 10*3/uL (ref 0.00–0.07)
Basophils Absolute: 0.1 10*3/uL (ref 0.0–0.1)
Basophils Relative: 1 %
Eosinophils Absolute: 0.9 10*3/uL — ABNORMAL HIGH (ref 0.0–0.5)
Eosinophils Relative: 8 %
HCT: 41.3 % (ref 36.0–46.0)
Hemoglobin: 13.3 g/dL (ref 12.0–15.0)
Immature Granulocytes: 0 %
Lymphocytes Relative: 20 %
Lymphs Abs: 2.2 10*3/uL (ref 0.7–4.0)
MCH: 29.6 pg (ref 26.0–34.0)
MCHC: 32.2 g/dL (ref 30.0–36.0)
MCV: 92 fL (ref 80.0–100.0)
Monocytes Absolute: 0.5 10*3/uL (ref 0.1–1.0)
Monocytes Relative: 4 %
Neutro Abs: 7.7 10*3/uL (ref 1.7–7.7)
Neutrophils Relative %: 67 %
Platelets: 309 10*3/uL (ref 150–400)
RBC: 4.49 MIL/uL (ref 3.87–5.11)
RDW: 12.9 % (ref 11.5–15.5)
WBC: 11.4 10*3/uL — ABNORMAL HIGH (ref 4.0–10.5)
nRBC: 0 % (ref 0.0–0.2)

## 2024-04-24 LAB — PROTIME-INR
INR: 1.6 — ABNORMAL HIGH (ref 0.8–1.2)
Prothrombin Time: 19.1 s — ABNORMAL HIGH (ref 11.4–15.2)

## 2024-04-24 LAB — BASIC METABOLIC PANEL WITH GFR
Anion gap: 11 (ref 5–15)
BUN: 33 mg/dL — ABNORMAL HIGH (ref 8–23)
CO2: 22 mmol/L (ref 22–32)
Calcium: 9.4 mg/dL (ref 8.9–10.3)
Chloride: 104 mmol/L (ref 98–111)
Creatinine, Ser: 1.36 mg/dL — ABNORMAL HIGH (ref 0.44–1.00)
GFR, Estimated: 41 mL/min — ABNORMAL LOW (ref >60)
Glucose, Bld: 193 mg/dL — ABNORMAL HIGH (ref 70–99)
Potassium: 4.5 mmol/L (ref 3.5–5.1)
Sodium: 137 mmol/L (ref 135–145)

## 2024-04-24 MED ORDER — ACETAMINOPHEN 500 MG PO TABS
1000.0000 mg | ORAL_TABLET | Freq: Once | ORAL | Status: AC
  Administered 2024-04-24: 1000 mg via ORAL
  Filled 2024-04-24: qty 2

## 2024-04-24 NOTE — ED Triage Notes (Signed)
 Patient brought in by [facility] after a fall at home. Patient takes warfarin for afib. Home from [employer]. Lost balance on step. Bad knees and went down. Head hit concrete twice. No LOC, can recall event in entirety. Left side hematoma noted by RN. Hx of afib, controlled afib rate with EMS rate 120.   BP 130/70 O2 100 on room air CBG 164  Left hand skin tear bleeding controlled.   No dizziness, no nausea, vomiting,. No repetitive questioning.   No peripheral access, no c-collar.

## 2024-04-24 NOTE — ED Provider Notes (Signed)
  EMERGENCY DEPARTMENT AT [facility] Provider Note  CSN: [id number] Arrival date & time: 04/24/24 1820  Chief Complaint(s) Fall  HPI Peggy Chen is a 73 y.o. female with history of A-fib on warfarin presenting with fall.  Patient reports that she was climbing the stairs to go home, lost her balance on the step and then fell hitting the back of her head on the ground.  No loss of consciousness.  Reports also injury to the left hand.  No neck pain.  No back pain.  No pain in the bilateral upper or lower extremities, skin wound of the left hand is not painful.  Unsure last tetanus but thinks within the last 5 years.   Past Medical History Past Medical History:  Diagnosis Date   Anxiety    Arthritis    Asthma    Bronchitis   Atherosclerosis 11/25/2017   noted on CT   Back pain    Depression    Diabetes mellitus    Diverticulosis 11/25/2017   mild, noted on CT   Fatty liver 11/25/2017   noted on CT   First degree AV block    History of kidney stones    Hydronephrosis 11/25/2017   mild left noted on CT   Hyperlipidemia    Hypertension    PAF (paroxysmal atrial fibrillation) (HCC)    Renal cyst 11/2017   Left, noted on US    Right renal artery stenosis (HCC)    Sinus bradycardia    Skin cancer    questionable skin cancer on face years ago   UTI (urinary tract infection)    Patient Active Problem List   Diagnosis Date Noted   Acute kidney injury superimposed on chronic kidney disease (HCC) 10/30/2023   Supratherapeutic INR 10/30/2023   Normocytic anemia 10/30/2023   Leukocytosis 10/30/2023   Fall at home, initial encounter 10/30/2023   Hypokalemia 11/25/2017   Falls 11/25/2017   Type 2 diabetes mellitus (HCC) 11/25/2017   Chronic anticoagulation 11/25/2017   Sepsis secondary to UTI (HCC) 11/24/2017   Hyperlipidemia 06/17/2015   Hypertension associated with diabetes (HCC) 09/28/2011   Paroxysmal atrial fibrillation (HCC) 06/29/2011   Home  Medication(s) Prior to Admission medications   Medication Sig Start Date End Date Taking? Authorizing Provider  amLODipine  (NORVASC ) 5 MG tablet Take 5 mg by mouth daily. 08/11/16   [provider]  aspirin  81 MG tablet Take 81 mg by mouth daily.  01/05/11   [provider]  colesevelam (WELCHOL) 625 MG tablet Take 1-3 tablets by mouth as needed (for colon). 03/28/20   [provider]  cyanocobalamin 1000 MCG tablet Take 1,000 mcg by mouth daily.     [provider]  DULoxetine  (CYMBALTA ) 60 MG capsule Take 60 mg by mouth daily.    [provider]  flecainide  (TAMBOCOR ) 150 MG tablet TAKE 1/2 TABLET (75 MG TOTAL) BY MOUTH TWICE A DAY 03/22/24   Nahser, Lela Purple, MD  HYDROcodone -acetaminophen  (NORCO) 10-325 MG tablet Take 1 tablet by mouth in the morning, at noon, in the evening, and at bedtime.    [provider]  JENTADUETO 2.04-999 MG TABS Take 1 tablet by mouth 2 (two) times daily. 09/02/19   [provider]  magnesium  30 MG tablet Take 1 tablet (30 mg total) by mouth daily. Patient not taking: Reported on 03/07/2024 12/16/23   Horton, Sidra Dredge, DO  metoprolol  succinate (TOPROL -XL) 25 MG 24 hr tablet Take 1 tablet (25 mg total) by mouth  daily. Patient not taking: Reported on 03/07/2024 11/02/23 12/15/23  Magdalene School, MD  potassium chloride  SA (KLOR-CON  M) 20 MEQ tablet Take 2 tablets (40 mEq total) by mouth daily. Patient not taking: Reported on 03/07/2024 12/16/23   Horton, Kristie M, DO  warfarin (COUMADIN ) 5 MG tablet Take 0.5 tablets (2.5 mg total) by mouth daily at 4 PM. Take 1/2 tablet (2.5 mg total) by mouth daily at 4 PM or as directed by office Patient taking differently: Take 2.5-5 mg by mouth daily at 4 PM. Take 5 mg by mouth on Mondays and Wednesdays, then take 1/2 tablet (2.5 mg total) by mouth rest of days per patient 11/02/23   Magdalene School, MD  zolpidem  (AMBIEN ) 5 MG tablet Take 5 mg by mouth at bedtime. 05/27/20   [provider]                                                                                                                                    Past Surgical History Past Surgical History:  Procedure Laterality Date   BREAST CYST REMOVAL     RIGHT BREAST   CARDIOVERSION N/A 04/17/2014   Procedure: CARDIOVERSION;  Surgeon: Lake Pilgrim, MD;  Location: [facility] ENDOSCOPY;  Service: Cardiovascular;  Laterality: N/A;   CARDIOVERSION  05/2014   attempted DC failed   COLON SURGERY     May 2000   COLONOSCOPY     CYSTOSCOPY/URETEROSCOPY/HOLMIUM LASER/STENT PLACEMENT Left 11/26/2017   Procedure: CYSTOSCOPY/STENT PLACEMENT;  Surgeon: Florencio Hunting, MD;  Location: [facility] ORS;  Service: Urology;  Laterality: Left;   CYSTOSCOPY/URETEROSCOPY/HOLMIUM LASER/STENT PLACEMENT Left 12/23/2017   Procedure: CYSTOSCOPY/ LEFT URETEROSCOPY;  Surgeon: Florencio Hunting, MD;  Location: [facility] ORS;  Service: Urology;  Laterality: Left;   TONSILLECTOMY     US  ECHOCARDIOGRAPHY  12/30/2009   EF 55-60%   Family History Family History  Problem Relation Age of Onset   COPD Mother    Heart attack Father     Social History Social History   Tobacco Use   Smoking status: Former    Current packs/day: 0.00    Types: Cigarettes    Quit date: 06/22/1986    Years since quitting: 37.8   Smokeless tobacco: Never  Vaping Use   Vaping status: Never Used  Substance Use Topics   Alcohol use: Yes    Comment: occasional   Drug use: No   Allergies Penicillins  Review of Systems Review of Systems  All other systems reviewed and are negative.   Physical Exam Vital Signs  I have reviewed the triage vital signs BP 132/78   Pulse 83   Temp 98.4 F (36.9 C) (Oral)   Resp 12   Ht 5\' 4"  (1.626 m)   Wt 77.1 kg   SpO2 99%   BMI 29.18 kg/m  Physical Exam Vitals and nursing note reviewed.  Constitutional:      General: She is not in  acute distress.    Appearance: She is well-developed.  HENT:     Head: Normocephalic.      Comments: Hematoma to the left posterior scalp without overlying wound    Mouth/Throat:     Mouth: Mucous membranes are moist.  Eyes:     Pupils: Pupils are equal, round, and reactive to light.  Cardiovascular:     Rate and Rhythm: Normal rate and regular rhythm.     Heart sounds: No murmur heard. Pulmonary:     Effort: Pulmonary effort is normal. No respiratory distress.     Breath sounds: Normal breath sounds.  Abdominal:     General: Abdomen is flat.     Palpations: Abdomen is soft.     Tenderness: There is no abdominal tenderness.  Musculoskeletal:        General: No tenderness.     Right lower leg: No edema.     Left lower leg: No edema.     Comments: No midline C, T, L-spine tenderness.  No chest wall tenderness or crepitus.  Full painless range of motion at the bilateral upper extremities including the shoulders, elbows, wrists, hand and fingers, and in the bilateral lower extremities including the hips, knees, ankle, toes.  No focal bony tenderness, injury or deformity.   Skin:    General: Skin is warm and dry.     Comments: Approximately 2 cm skin tear to the left hand on the dorsal aspect near the fifth digit  Neurological:     General: No focal deficit present.     Mental Status: She is alert. Mental status is at baseline.  Psychiatric:        Mood and Affect: Mood normal.        Behavior: Behavior normal.     ED Results and Treatments Labs (all labs ordered are listed, but only abnormal results are displayed) Labs Reviewed  CBC WITH DIFFERENTIAL/PLATELET - Abnormal; Notable for the following components:      Result Value   WBC 11.4 (*)    Eosinophils Absolute 0.9 (*)    All other components within normal limits  BASIC METABOLIC PANEL WITH GFR - Abnormal; Notable for the following components:   Glucose, Bld 193 (*)    BUN 33 (*)    Creatinine, Ser 1.36 (*)    GFR, Estimated 41 (*)    All other components within normal limits  PROTIME-INR - Abnormal; Notable  for the following components:   Prothrombin Time 19.1 (*)    INR 1.6 (*)    All other components within normal limits                                                                                                                          Radiology CT Head Wo Contrast Result Date: 04/24/2024 CLINICAL DATA:  Head trauma, minor (Age >= 65y); Neck trauma (Age >= 65y). Fall EXAM: CT HEAD WITHOUT CONTRAST CT CERVICAL SPINE WITHOUT CONTRAST TECHNIQUE: Multidetector CT  imaging of the head and cervical spine was performed following the standard protocol without intravenous contrast. Multiplanar CT image reconstructions of the cervical spine were also generated. RADIATION DOSE REDUCTION: This exam was performed according to the departmental dose-optimization program which includes automated exposure control, adjustment of the mA and/or kV according to patient size and/or use of iterative reconstruction technique. COMPARISON:  None Available. FINDINGS: CT HEAD FINDINGS Brain: Normal anatomic configuration. Parenchymal volume loss is commensurate with the patient's age. Mild periventricular white matter changes are present likely reflecting the sequela of small vessel ischemia. No abnormal intra or extra-axial mass lesion or fluid collection. No abnormal mass effect or midline shift. No evidence of acute intracranial hemorrhage or infarct. Ventricular size is normal. Cerebellum unremarkable. Vascular: No asymmetric hyperdense vasculature at the skull base. Skull: Intact Sinuses/Orbits: Paranasal sinuses are clear. Orbits are unremarkable. Other: Mastoid air cells and middle ear cavities are clear. Moderate left parietal scalp hematoma. CT CERVICAL SPINE FINDINGS Alignment: Overall straightening of the cervical spine. 1-2 mm anterolisthesis C5-6. Skull base and vertebrae: No acute fracture. No primary bone lesion or focal pathologic process. Soft tissues and spinal canal: No prevertebral fluid or swelling. No visible  canal hematoma. Disc levels: Disc space narrowing and endplate remodeling is seen throughout the cervical spine in keeping with changes of diffuse degenerative disc disease, most severe at C6-7. Prevertebral soft tissues are not thickened on sagittal reformats. No high-grade canal stenosis. Uncovertebral arthrosis results in severe bilateral neuroforaminal narrowing at C6-7. More moderate neuroforaminal narrowing on right at C4-5 Upper chest: Negative. Other: None IMPRESSION: 1. No acute intracranial abnormality. No skull fracture. 2. Moderate left parietal scalp hematoma. 3. No acute fracture or malalignment of the cervical spine. 4. Diffuse degenerative disc disease throughout the cervical spine, most severe at C6-7. Severe bilateral neuroforaminal narrowing at C6-7. More moderate neuroforaminal narrowing on the right at C4-5. Electronically Signed   By: Worthy Heads M.D.   On: 04/24/2024 19:51   CT Cervical Spine Wo Contrast Result Date: 04/24/2024 CLINICAL DATA:  Head trauma, minor (Age >= 65y); Neck trauma (Age >= 65y). Fall EXAM: CT HEAD WITHOUT CONTRAST CT CERVICAL SPINE WITHOUT CONTRAST TECHNIQUE: Multidetector CT imaging of the head and cervical spine was performed following the standard protocol without intravenous contrast. Multiplanar CT image reconstructions of the cervical spine were also generated. RADIATION DOSE REDUCTION: This exam was performed according to the departmental dose-optimization program which includes automated exposure control, adjustment of the mA and/or kV according to patient size and/or use of iterative reconstruction technique. COMPARISON:  None Available. FINDINGS: CT HEAD FINDINGS Brain: Normal anatomic configuration. Parenchymal volume loss is commensurate with the patient's age. Mild periventricular white matter changes are present likely reflecting the sequela of small vessel ischemia. No abnormal intra or extra-axial mass lesion or fluid collection. No abnormal mass  effect or midline shift. No evidence of acute intracranial hemorrhage or infarct. Ventricular size is normal. Cerebellum unremarkable. Vascular: No asymmetric hyperdense vasculature at the skull base. Skull: Intact Sinuses/Orbits: Paranasal sinuses are clear. Orbits are unremarkable. Other: Mastoid air cells and middle ear cavities are clear. Moderate left parietal scalp hematoma. CT CERVICAL SPINE FINDINGS Alignment: Overall straightening of the cervical spine. 1-2 mm anterolisthesis C5-6. Skull base and vertebrae: No acute fracture. No primary bone lesion or focal pathologic process. Soft tissues and spinal canal: No prevertebral fluid or swelling. No visible canal hematoma. Disc levels: Disc space narrowing and endplate remodeling is seen throughout the cervical spine in keeping with changes  of diffuse degenerative disc disease, most severe at C6-7. Prevertebral soft tissues are not thickened on sagittal reformats. No high-grade canal stenosis. Uncovertebral arthrosis results in severe bilateral neuroforaminal narrowing at C6-7. More moderate neuroforaminal narrowing on right at C4-5 Upper chest: Negative. Other: None IMPRESSION: 1. No acute intracranial abnormality. No skull fracture. 2. Moderate left parietal scalp hematoma. 3. No acute fracture or malalignment of the cervical spine. 4. Diffuse degenerative disc disease throughout the cervical spine, most severe at C6-7. Severe bilateral neuroforaminal narrowing at C6-7. More moderate neuroforaminal narrowing on the right at C4-5. Electronically Signed   By: Worthy Heads M.D.   On: 04/24/2024 19:51    Pertinent labs & imaging results that were available during my care of the patient were reviewed by me and considered in my medical decision making (see MDM for details).  Medications Ordered in ED Medications  acetaminophen  (TYLENOL ) tablet 1,000 mg (1,000 mg Oral Given 04/24/24 2020)                                                                                                                                      Procedures .Laceration Repair  Date/Time: 04/24/2024 9:32 PM  Performed by: Mordecai Applebaum, MD Authorized by: Mordecai Applebaum, MD   Consent:    Consent obtained:  Verbal   Consent given by:  Patient   Risks, benefits, and alternatives were discussed: yes     Risks discussed:  Infection, need for additional repair, nerve damage, pain, vascular damage, poor wound healing, poor cosmetic result, retained foreign body and tendon damage   Alternatives discussed:  No treatment Universal protocol:    Patient identity confirmed:  Verbally with patient and arm band Laceration details:    Location:  Hand   Hand location:  L hand, dorsum   Length (cm):  2 Treatment:    Area cleansed with:  Saline   Amount of cleaning:  Standard   Irrigation solution:  Sterile saline   Debridement:  None   Undermining:  None   Scar revision: no   Skin repair:    Repair method:  Tissue adhesive Approximation:    Approximation:  Close Repair type:    Repair type:  Simple   (including critical care time)  Medical Decision Making / ED Course   MDM:  73 year old presenting after fall.  Patient reports falling after losing her balance going up stairs, denies any presyncopal symptoms like lightheadedness, chest pain, dizziness.  Reports feeling okay currently, has some pain around her scalp hematoma.  CT scans negative for any injury.  INR was checked and slightly low so discussed with the patient.  She should call her Coumadin  clinic to discuss this.  Does have a small wound to her left hand which is a skin tear, no evidence of any underlying fracture, no tenderness.  Was repaired with Dermabond.  Patient not fully sure about when last tetanus  was, offered tetanus shot here, she will call her primary doctor tomorrow and see if she needs an update.  Will discharge patient to home. All questions answered. Patient comfortable with plan of  discharge. Return precautions discussed with patient and specified on the after visit summary.       Additional history obtained: -Additional history obtained from ems    Lab Tests: -I ordered, reviewed, and interpreted labs.   The pertinent results include:   Labs Reviewed  CBC WITH DIFFERENTIAL/PLATELET - Abnormal; Notable for the following components:      Result Value   WBC 11.4 (*)    Eosinophils Absolute 0.9 (*)    All other components within normal limits  BASIC METABOLIC PANEL WITH GFR - Abnormal; Notable for the following components:   Glucose, Bld 193 (*)    BUN 33 (*)    Creatinine, Ser 1.36 (*)    GFR, Estimated 41 (*)    All other components within normal limits  PROTIME-INR - Abnormal; Notable for the following components:   Prothrombin Time 19.1 (*)    INR 1.6 (*)    All other components within normal limits    Notable for mild leukocytosis, stable ckd  EKG   EKG Interpretation Date/Time:  Monday Apr 24 2024 18:34:14 EDT Ventricular Rate:  110 PR Interval:    QRS Duration:  76 QT Interval:  423 QTC Calculation: 573 R Axis:   4  Text Interpretation: Atrial fibrillation Ventricular premature complex Low voltage, precordial leads Prolonged QT interval Confirmed by Hiawatha Lout ([id number]) on 04/24/2024 6:42:32 PM         Imaging Studies ordered: I ordered imaging studies including CT head, neck On my interpretation imaging demonstrates no fracture  I independently visualized and interpreted imaging. I agree with the radiologist interpretation   Medicines ordered and prescription drug management: Meds ordered this encounter  Medications   acetaminophen  (TYLENOL ) tablet 1,000 mg    -I have reviewed the patients home medicines and have made adjustments as needed   Reevaluation: After the interventions noted above, I reevaluated the patient and found that their symptoms have improved  Co morbidities that complicate the patient  evaluation  Past Medical History:  Diagnosis Date   Anxiety    Arthritis    Asthma    Bronchitis   Atherosclerosis 11/25/2017   noted on CT   Back pain    Depression    Diabetes mellitus    Diverticulosis 11/25/2017   mild, noted on CT   Fatty liver 11/25/2017   noted on CT   First degree AV block    History of kidney stones    Hydronephrosis 11/25/2017   mild left noted on CT   Hyperlipidemia    Hypertension    PAF (paroxysmal atrial fibrillation) (HCC)    Renal cyst 11/2017   Left, noted on US    Right renal artery stenosis (HCC)    Sinus bradycardia    Skin cancer    questionable skin cancer on face years ago   UTI (urinary tract infection)       Dispostion: Disposition decision including need for hospitalization was considered, and patient discharged from emergency department.    Final Clinical Impression(s) / ED Diagnoses Final diagnoses:  Fall, initial encounter  Injury of head, initial encounter  Skin tear of left hand without complication, initial encounter     This chart was dictated using voice recognition software.  Despite best efforts to proofread,  errors can occur which  can change the documentation meaning.    Mordecai Applebaum, MD 04/24/24 2134

## 2024-04-24 NOTE — Discharge Instructions (Signed)
 We evaluated you for your fall. Your imaging was negative. Please follow up with your primary physician.

## 2024-04-24 NOTE — Progress Notes (Signed)
   04/24/24 1825  Spiritual Encounters  Type of Visit Initial  Care provided to: Pt not available  Referral source Trauma page  Reason for visit Trauma  OnCall Visit No   Chaplain responded to a level two trauma. The patient, Peggy Chen was attended to by the medical team. No family is present. If a chaplain is requested someone will respond.   Clarence Croak [facility]  [phone]

## 2024-10-04 ENCOUNTER — Emergency Department (HOSPITAL_COMMUNITY)

## 2024-10-04 ENCOUNTER — Other Ambulatory Visit: Payer: Self-pay

## 2024-10-04 ENCOUNTER — Encounter (HOSPITAL_COMMUNITY): Payer: Self-pay

## 2024-10-04 ENCOUNTER — Inpatient Hospital Stay (HOSPITAL_COMMUNITY)
Admission: EM | Admit: 2024-10-04 | Discharge: 2024-10-10 | DRG: 690 | Disposition: A | Discharge status: Skilled Nursing Facility | Attending: Internal Medicine | Admitting: Internal Medicine

## 2024-10-04 DIAGNOSIS — T796XXA Traumatic ischemia of muscle, initial encounter: Secondary | ICD-10-CM | POA: Diagnosis not present

## 2024-10-04 DIAGNOSIS — K5730 Diverticulosis of large intestine without perforation or abscess without bleeding: Secondary | ICD-10-CM | POA: Diagnosis present

## 2024-10-04 DIAGNOSIS — Z880 Allergy status to penicillin: Secondary | ICD-10-CM

## 2024-10-04 DIAGNOSIS — E1169 Type 2 diabetes mellitus with other specified complication: Secondary | ICD-10-CM

## 2024-10-04 DIAGNOSIS — N179 Acute kidney failure, unspecified: Secondary | ICD-10-CM | POA: Diagnosis present

## 2024-10-04 DIAGNOSIS — Z7982 Long term (current) use of aspirin: Secondary | ICD-10-CM

## 2024-10-04 DIAGNOSIS — M6282 Rhabdomyolysis: Secondary | ICD-10-CM | POA: Diagnosis not present

## 2024-10-04 DIAGNOSIS — Z794 Long term (current) use of insulin: Secondary | ICD-10-CM

## 2024-10-04 DIAGNOSIS — F419 Anxiety disorder, unspecified: Secondary | ICD-10-CM | POA: Diagnosis present

## 2024-10-04 DIAGNOSIS — G8929 Other chronic pain: Secondary | ICD-10-CM | POA: Diagnosis present

## 2024-10-04 DIAGNOSIS — D631 Anemia in chronic kidney disease: Secondary | ICD-10-CM | POA: Diagnosis present

## 2024-10-04 DIAGNOSIS — Z87891 Personal history of nicotine dependence: Secondary | ICD-10-CM

## 2024-10-04 DIAGNOSIS — I440 Atrioventricular block, first degree: Secondary | ICD-10-CM | POA: Diagnosis present

## 2024-10-04 DIAGNOSIS — Z7409 Other reduced mobility: Secondary | ICD-10-CM | POA: Diagnosis present

## 2024-10-04 DIAGNOSIS — R627 Adult failure to thrive: Secondary | ICD-10-CM | POA: Diagnosis present

## 2024-10-04 DIAGNOSIS — E860 Dehydration: Secondary | ICD-10-CM | POA: Diagnosis present

## 2024-10-04 DIAGNOSIS — Z7984 Long term (current) use of oral hypoglycemic drugs: Secondary | ICD-10-CM

## 2024-10-04 DIAGNOSIS — J45909 Unspecified asthma, uncomplicated: Secondary | ICD-10-CM | POA: Diagnosis present

## 2024-10-04 DIAGNOSIS — I129 Hypertensive chronic kidney disease with stage 1 through stage 4 chronic kidney disease, or unspecified chronic kidney disease: Secondary | ICD-10-CM | POA: Diagnosis present

## 2024-10-04 DIAGNOSIS — N1832 Chronic kidney disease, stage 3b: Secondary | ICD-10-CM | POA: Diagnosis present

## 2024-10-04 DIAGNOSIS — Z7901 Long term (current) use of anticoagulants: Secondary | ICD-10-CM

## 2024-10-04 DIAGNOSIS — Z825 Family history of asthma and other chronic lower respiratory diseases: Secondary | ICD-10-CM

## 2024-10-04 DIAGNOSIS — B961 Klebsiella pneumoniae [K. pneumoniae] as the cause of diseases classified elsewhere: Secondary | ICD-10-CM | POA: Diagnosis present

## 2024-10-04 DIAGNOSIS — Z79899 Other long term (current) drug therapy: Secondary | ICD-10-CM

## 2024-10-04 DIAGNOSIS — F32A Depression, unspecified: Secondary | ICD-10-CM | POA: Diagnosis present

## 2024-10-04 DIAGNOSIS — W19XXXA Unspecified fall, initial encounter: Principal | ICD-10-CM

## 2024-10-04 DIAGNOSIS — Z85828 Personal history of other malignant neoplasm of skin: Secondary | ICD-10-CM

## 2024-10-04 DIAGNOSIS — Z8249 Family history of ischemic heart disease and other diseases of the circulatory system: Secondary | ICD-10-CM

## 2024-10-04 DIAGNOSIS — M1712 Unilateral primary osteoarthritis, left knee: Secondary | ICD-10-CM | POA: Diagnosis present

## 2024-10-04 DIAGNOSIS — E785 Hyperlipidemia, unspecified: Secondary | ICD-10-CM | POA: Diagnosis present

## 2024-10-04 DIAGNOSIS — E1122 Type 2 diabetes mellitus with diabetic chronic kidney disease: Secondary | ICD-10-CM | POA: Diagnosis present

## 2024-10-04 DIAGNOSIS — I340 Nonrheumatic mitral (valve) insufficiency: Secondary | ICD-10-CM | POA: Diagnosis present

## 2024-10-04 DIAGNOSIS — I4819 Other persistent atrial fibrillation: Secondary | ICD-10-CM | POA: Diagnosis present

## 2024-10-04 DIAGNOSIS — E1165 Type 2 diabetes mellitus with hyperglycemia: Secondary | ICD-10-CM | POA: Diagnosis present

## 2024-10-04 DIAGNOSIS — N3000 Acute cystitis without hematuria: Secondary | ICD-10-CM | POA: Diagnosis not present

## 2024-10-04 DIAGNOSIS — I4891 Unspecified atrial fibrillation: Secondary | ICD-10-CM

## 2024-10-04 DIAGNOSIS — Z23 Encounter for immunization: Secondary | ICD-10-CM

## 2024-10-04 DIAGNOSIS — E876 Hypokalemia: Secondary | ICD-10-CM | POA: Diagnosis present

## 2024-10-04 LAB — URINALYSIS, ROUTINE W REFLEX MICROSCOPIC
Bilirubin Urine: NEGATIVE
Glucose, UA: NEGATIVE mg/dL
Ketones, ur: 5 mg/dL — AB
Nitrite: POSITIVE — AB
Protein, ur: 100 mg/dL — AB
Specific Gravity, Urine: 1.019 (ref 1.005–1.030)
pH: 5 (ref 5.0–8.0)

## 2024-10-04 LAB — COMPREHENSIVE METABOLIC PANEL WITH GFR
ALT: 17 U/L (ref 0–44)
AST: 31 U/L (ref 15–41)
Albumin: 3.6 g/dL (ref 3.5–5.0)
Alkaline Phosphatase: 65 U/L (ref 38–126)
Anion gap: 18 — ABNORMAL HIGH (ref 5–15)
BUN: 38 mg/dL — ABNORMAL HIGH (ref 8–23)
CO2: 21 mmol/L — ABNORMAL LOW (ref 22–32)
Calcium: 9.7 mg/dL (ref 8.9–10.3)
Chloride: 101 mmol/L (ref 98–111)
Creatinine, Ser: 1.69 mg/dL — ABNORMAL HIGH (ref 0.44–1.00)
GFR, Estimated: 31 mL/min — ABNORMAL LOW (ref >60)
Glucose, Bld: 158 mg/dL — ABNORMAL HIGH (ref 70–99)
Potassium: 3.3 mmol/L — ABNORMAL LOW (ref 3.5–5.1)
Sodium: 140 mmol/L (ref 135–145)
Total Bilirubin: 1.4 mg/dL — ABNORMAL HIGH (ref 0.0–1.2)
Total Protein: 7.5 g/dL (ref 6.5–8.1)

## 2024-10-04 LAB — PROTIME-INR
INR: 2.3 — ABNORMAL HIGH (ref 0.8–1.2)
Prothrombin Time: 26.4 s — ABNORMAL HIGH (ref 11.4–15.2)

## 2024-10-04 LAB — CBC
HCT: 47.3 % — ABNORMAL HIGH (ref 36.0–46.0)
Hemoglobin: 15.4 g/dL — ABNORMAL HIGH (ref 12.0–15.0)
MCH: 29.7 pg (ref 26.0–34.0)
MCHC: 32.6 g/dL (ref 30.0–36.0)
MCV: 91.1 fL (ref 80.0–100.0)
Platelets: 310 K/uL (ref 150–400)
RBC: 5.19 MIL/uL — ABNORMAL HIGH (ref 3.87–5.11)
RDW: 12.7 % (ref 11.5–15.5)
WBC: 17.2 K/uL — ABNORMAL HIGH (ref 4.0–10.5)
nRBC: 0 % (ref 0.0–0.2)

## 2024-10-04 LAB — CK: Total CK: 410 U/L — ABNORMAL HIGH (ref 38–234)

## 2024-10-04 LAB — HEMOGLOBIN A1C
Hgb A1c MFr Bld: 6.1 % — ABNORMAL HIGH (ref 4.8–5.6)
Mean Plasma Glucose: 128.37 mg/dL

## 2024-10-04 LAB — CBG MONITORING, ED
Glucose-Capillary: 147 mg/dL — ABNORMAL HIGH (ref 70–99)
Glucose-Capillary: 173 mg/dL — ABNORMAL HIGH (ref 70–99)

## 2024-10-04 LAB — APTT: aPTT: 45 s — ABNORMAL HIGH (ref 24–36)

## 2024-10-04 MED ORDER — HYDROCODONE-ACETAMINOPHEN 10-325 MG PO TABS
1.0000 | ORAL_TABLET | Freq: Four times a day (QID) | ORAL | Status: DC | PRN
  Administered 2024-10-04 – 2024-10-10 (×10): 1 via ORAL
  Filled 2024-10-04 (×10): qty 1

## 2024-10-04 MED ORDER — HYDROMORPHONE HCL 1 MG/ML IJ SOLN
0.5000 mg | INTRAMUSCULAR | Status: DC | PRN
  Administered 2024-10-05 – 2024-10-07 (×2): 0.5 mg via INTRAVENOUS
  Filled 2024-10-04: qty 0.5
  Filled 2024-10-04: qty 1
  Filled 2024-10-04: qty 0.5

## 2024-10-04 MED ORDER — SODIUM CHLORIDE 0.9 % IV SOLN
1.0000 g | Freq: Once | INTRAVENOUS | Status: AC
  Administered 2024-10-04: 1 g via INTRAVENOUS
  Filled 2024-10-04: qty 10

## 2024-10-04 MED ORDER — POLYETHYLENE GLYCOL 3350 17 G PO PACK
17.0000 g | PACK | Freq: Every day | ORAL | Status: DC | PRN
Start: 2024-10-04 — End: 2024-10-10

## 2024-10-04 MED ORDER — WARFARIN - PHARMACIST DOSING INPATIENT: Freq: Every day | Status: DC

## 2024-10-04 MED ORDER — ACETAMINOPHEN 325 MG PO TABS: 650.0000 mg | ORAL_TABLET | Freq: Four times a day (QID) | ORAL | Status: DC | PRN

## 2024-10-04 MED ORDER — ZOLPIDEM TARTRATE 5 MG PO TABS
5.0000 mg | ORAL_TABLET | Freq: Every day | ORAL | Status: DC
  Administered 2024-10-04 – 2024-10-09 (×6): 5 mg via ORAL
  Filled 2024-10-04 (×6): qty 1

## 2024-10-04 MED ORDER — INSULIN ASPART 100 UNIT/ML IJ SOLN
0.0000 [IU] | Freq: Every day | INTRAMUSCULAR | Status: DC
  Administered 2024-10-06: 2 [IU] via SUBCUTANEOUS
  Administered 2024-10-07: 1 [IU] via SUBCUTANEOUS
  Filled 2024-10-04: qty 5

## 2024-10-04 MED ORDER — COLESEVELAM HCL 625 MG PO TABS: 625.0000 mg | ORAL_TABLET | ORAL | Status: DC | PRN

## 2024-10-04 MED ORDER — DILTIAZEM HCL-DEXTROSE 125-5 MG/125ML-% IV SOLN (PREMIX)
5.0000 mg/h | INTRAVENOUS | Status: DC
  Administered 2024-10-04: 5 mg/h via INTRAVENOUS
  Filled 2024-10-04 (×2): qty 125

## 2024-10-04 MED ORDER — FLECAINIDE ACETATE 50 MG PO TABS
75.0000 mg | ORAL_TABLET | Freq: Two times a day (BID) | ORAL | Status: DC
Start: 2024-10-04 — End: 2024-10-06
  Administered 2024-10-04 – 2024-10-06 (×3): 75 mg via ORAL
  Filled 2024-10-04 (×4): qty 2

## 2024-10-04 MED ORDER — ASPIRIN 81 MG PO TBEC
81.0000 mg | DELAYED_RELEASE_TABLET | Freq: Every day | ORAL | Status: DC
Start: 2024-10-05 — End: 2024-10-09
  Administered 2024-10-05 – 2024-10-08 (×4): 81 mg via ORAL
  Filled 2024-10-04 (×4): qty 1

## 2024-10-04 MED ORDER — ENOXAPARIN SODIUM 30 MG/0.3ML IJ SOSY: 30.0000 mg | PREFILLED_SYRINGE | INTRAMUSCULAR | Status: DC

## 2024-10-04 MED ORDER — ACETAMINOPHEN 650 MG RE SUPP: 650.0000 mg | Freq: Four times a day (QID) | RECTAL | Status: DC | PRN

## 2024-10-04 MED ORDER — OXYCODONE HCL 5 MG PO TABS: 5.0000 mg | ORAL_TABLET | ORAL | Status: DC | PRN

## 2024-10-04 MED ORDER — HYDRALAZINE HCL 20 MG/ML IJ SOLN: 10.0000 mg | Freq: Four times a day (QID) | INTRAMUSCULAR | Status: DC | PRN

## 2024-10-04 MED ORDER — SODIUM CHLORIDE 0.9 % IV BOLUS: 1000.0000 mL | Freq: Once | INTRAVENOUS | Status: DC

## 2024-10-04 MED ORDER — DILTIAZEM LOAD VIA INFUSION
10.0000 mg | Freq: Once | INTRAVENOUS | Status: AC
  Administered 2024-10-04: 10 mg via INTRAVENOUS
  Filled 2024-10-04: qty 10

## 2024-10-04 MED ORDER — POTASSIUM CHLORIDE CRYS ER 20 MEQ PO TBCR
40.0000 meq | EXTENDED_RELEASE_TABLET | Freq: Once | ORAL | Status: AC
  Administered 2024-10-04: 40 meq via ORAL
  Filled 2024-10-04: qty 2

## 2024-10-04 MED ORDER — ALBUTEROL SULFATE (2.5 MG/3ML) 0.083% IN NEBU: 2.5000 mg | INHALATION_SOLUTION | Freq: Four times a day (QID) | RESPIRATORY_TRACT | Status: DC | PRN

## 2024-10-04 MED ORDER — CEFTRIAXONE SODIUM 2 G IJ SOLR: 2.0000 g | Freq: Once | INTRAMUSCULAR | Status: DC

## 2024-10-04 MED ORDER — WARFARIN SODIUM 5 MG PO TABS
5.0000 mg | ORAL_TABLET | Freq: Once | ORAL | Status: AC
  Administered 2024-10-04: 5 mg via ORAL
  Filled 2024-10-04: qty 1

## 2024-10-04 MED ORDER — INSULIN ASPART 100 UNIT/ML IJ SOLN
0.0000 [IU] | Freq: Three times a day (TID) | INTRAMUSCULAR | Status: DC
  Administered 2024-10-05: 1 [IU] via SUBCUTANEOUS
  Administered 2024-10-05 – 2024-10-06 (×2): 3 [IU] via SUBCUTANEOUS
  Administered 2024-10-06: 2 [IU] via SUBCUTANEOUS
  Administered 2024-10-06: 1 [IU] via SUBCUTANEOUS
  Administered 2024-10-07 (×2): 2 [IU] via SUBCUTANEOUS
  Administered 2024-10-07: 5 [IU] via SUBCUTANEOUS
  Administered 2024-10-08 – 2024-10-10 (×5): 2 [IU] via SUBCUTANEOUS
  Filled 2024-10-04: qty 2
  Filled 2024-10-04: qty 3
  Filled 2024-10-04 (×2): qty 2

## 2024-10-04 MED ORDER — BISACODYL 5 MG PO TBEC: 5.0000 mg | DELAYED_RELEASE_TABLET | Freq: Every day | ORAL | Status: DC | PRN

## 2024-10-04 MED ORDER — SODIUM CHLORIDE 0.9 % IV SOLN: INTRAVENOUS | Status: AC

## 2024-10-04 NOTE — ED Notes (Signed)
 Pt defecated in bed. This NT and Nurse cleaned her up, placed a brief, and adjusted her in bed.

## 2024-10-04 NOTE — ED Notes (Signed)
 Ccmd contacted to monitor pt

## 2024-10-04 NOTE — ED Provider Notes (Addendum)
 [place] EMERGENCY DEPARTMENT AT [facility] Provider Note   CSN: [id number] Arrival date & time: 10/04/24  1425     Patient presents with: Peggy Chen is a 73 y.o. female.   Patient here after a fall Monday evening.  Was too weak to get back up.  She has poor ambulation at baseline.  She uses a walker.  Sounds like her walker hit something while she was going into the bathroom and fell to the ground was too weak to get up on her own.  She lives by herself.  Eventually a wellness check was done on her today and she is brought here for evaluation.  Is having pain to her left hip but she denies any headache neck pain back pain otherwise.  She does have some chronic back pain at baseline.  She denies any chest pain shortness of breath weakness numbness tingling.  She feels very dehydrated.  She has a history of A-fib.  The history is provided by the patient.       Prior to Admission medications   Medication Sig Start Date End Date Taking? Authorizing Provider  aspirin  81 MG tablet Take 81 mg by mouth every 6 (six) hours as needed for pain. 01/05/11  Yes [provider]  colesevelam (WELCHOL) 625 MG tablet Take 1-3 tablets by mouth as needed (for colon). 03/28/20  Yes [provider]  DULoxetine  (CYMBALTA ) 60 MG capsule Take 60 mg by mouth daily.   Yes [provider]  flecainide  (TAMBOCOR ) 150 MG tablet TAKE 1/2 TABLET (75 MG TOTAL) BY MOUTH TWICE A DAY 03/22/24  Yes Nahser, Aleene PARAS, MD  HYDROcodone -acetaminophen  (NORCO) 10-325 MG tablet Take 1 tablet by mouth 4 (four) times daily.   Yes [provider]  warfarin (COUMADIN ) 5 MG tablet Take 0.5 tablets (2.5 mg total) by mouth daily at 4 PM. Take 1/2 tablet (2.5 mg total) by mouth daily at 4 PM or as directed by office Patient taking differently: Take 2.5-5 mg by mouth daily at 4 PM. Take 5 mg by mouth on Mondays and Wednesdays, then take 1/2 tablet (2.5 mg total) by mouth rest of days  per patient 11/02/23  Yes Leotis Bogus, MD  zolpidem  (AMBIEN ) 10 MG tablet Take 10 mg by mouth at bedtime.   Yes [provider]    Allergies: Penicillins    Review of Systems  Updated Vital Signs BP (!) 135/99   Pulse (!) 104   Temp 97.7 F (36.5 C) (Oral)   Resp (!) 22   Ht 5' 4 (1.626 m)   Wt 74.4 kg   SpO2 99%   BMI 28.15 kg/m   Physical Exam Vitals and nursing note reviewed.  Constitutional:      General: She is not in acute distress.    Appearance: She is well-developed. She is not ill-appearing.  HENT:     Head: Normocephalic and atraumatic.     Nose: Nose normal.     Mouth/Throat:     Mouth: Mucous membranes are moist.  Eyes:     Extraocular Movements: Extraocular movements intact.     Conjunctiva/sclera: Conjunctivae normal.     Pupils: Pupils are equal, round, and reactive to light.  Cardiovascular:     Rate and Rhythm: Tachycardia present. Rhythm irregular.     Pulses: Normal pulses.     Heart sounds: Normal heart sounds. No murmur heard. Pulmonary:     Effort: Pulmonary effort is normal. No respiratory  distress.     Breath sounds: Normal breath sounds.  Abdominal:     Palpations: Abdomen is soft.     Tenderness: There is no abdominal tenderness.  Musculoskeletal:        General: Tenderness present. No swelling.     Cervical back: Normal range of motion and neck supple.     Comments: No midline spinal tenderness, some tenderness to the left hip  Skin:    General: Skin is warm and dry.     Capillary Refill: Capillary refill takes less than 2 seconds.  Neurological:     General: No focal deficit present.     Mental Status: She is alert and oriented to person, place, and time.     Cranial Nerves: No cranial nerve deficit.     Sensory: No sensory deficit.     Motor: No weakness.     Coordination: Coordination normal.     Comments: Globally weak in all 4 extremities but is able to move all 4 extremities, normal speech normal visual fields  normal sensation  Psychiatric:        Mood and Affect: Mood normal.     (all labs ordered are listed, but only abnormal results are displayed) Labs Reviewed  CBC - Abnormal; Notable for the following components:      Result Value   WBC 17.2 (*)    RBC 5.19 (*)    Hemoglobin 15.4 (*)    HCT 47.3 (*)    All other components within normal limits  COMPREHENSIVE METABOLIC PANEL WITH GFR - Abnormal; Notable for the following components:   Potassium 3.3 (*)    CO2 21 (*)    Glucose, Bld 158 (*)    BUN 38 (*)    Creatinine, Ser 1.69 (*)    Total Bilirubin 1.4 (*)    GFR, Estimated 31 (*)    Anion gap 18 (*)    All other components within normal limits  CK - Abnormal; Notable for the following components:   Total CK 410 (*)    All other components within normal limits  PROTIME-INR - Abnormal; Notable for the following components:   Prothrombin Time 26.4 (*)    INR 2.3 (*)    All other components within normal limits  APTT - Abnormal; Notable for the following components:   aPTT 45 (*)    All other components within normal limits  URINALYSIS, ROUTINE W REFLEX MICROSCOPIC - Abnormal; Notable for the following components:   APPearance CLOUDY (*)    Hgb urine dipstick MODERATE (*)    Ketones, ur 5 (*)    Protein, ur 100 (*)    Nitrite POSITIVE (*)    Leukocytes,Ua MODERATE (*)    Bacteria, UA MANY (*)    Non Squamous Epithelial 0-5 (*)    All other components within normal limits  HEMOGLOBIN A1C - Abnormal; Notable for the following components:   Hgb A1c MFr Bld 6.1 (*)    All other components within normal limits  CBG MONITORING, ED - Abnormal; Notable for the following components:   Glucose-Capillary 147 (*)    All other components within normal limits  BASIC METABOLIC PANEL WITH GFR  CBC  PROTIME-INR  CK  PHOSPHORUS  MAGNESIUM     EKG: EKG Interpretation Date/Time:  Wednesday October 04 2024 14:37:41 EDT Ventricular Rate:  114 PR Interval:    QRS Duration:  74 QT  Interval:  408 QTC Calculation: 562 R Axis:   16  Text Interpretation: Atrial  fibrillation Probable anterior infarct, age indeterminate Prolonged QT interval Confirmed by Randol Simmonds [id number]) on 10/04/2024 2:40:13 PM  Radiology: CT Cervical Spine Wo Contrast Result Date: 10/04/2024 CLINICAL DATA:  Status post fall. EXAM: CT CERVICAL SPINE WITHOUT CONTRAST TECHNIQUE: Multidetector CT imaging of the cervical spine was performed without intravenous contrast. Multiplanar CT image reconstructions were also generated. RADIATION DOSE REDUCTION: This exam was performed according to the departmental dose-optimization program which includes automated exposure control, adjustment of the mA and/or kV according to patient size and/or use of iterative reconstruction technique. COMPARISON:  Apr 24, 2024 FINDINGS: Alignment: There is straightening of the normal cervical spine lordosis. Stable, chronic approximately 1 mm to 2 mm retrolisthesis of the C4 vertebral body is noted on C5 with 1 mm to 2 mm anterolisthesis of C5 on C6. Skull base and vertebrae: No acute fracture. No primary bone lesion or focal pathologic process. Soft tissues and spinal canal: No prevertebral fluid or swelling. No visible canal hematoma. Disc levels: There is mild anterior osteophyte formation at the level of C3-C4 and C5-C6, with mild to moderate severity endplate sclerosis, anterior osteophyte formation and posterior bony spurring at the levels of C4-C5 and C6-C7. There is moderate to marked severity intervertebral disc space narrowing at C4-C5 with marked severity intervertebral disc space narrowing at C6-C7. Bilateral moderate severity multilevel facet joint hypertrophy is noted. Upper chest: Negative. Other: None. IMPRESSION: 1. No acute cervical spine fracture. 2. Multilevel degenerative changes, most prominent at the levels of C4-C5 and C6-C7. Electronically Signed   By: Suzen Dials M.D.   On: 10/04/2024 15:38   CT Head Wo  Contrast Result Date: 10/04/2024 CLINICAL DATA:  Status post fall. EXAM: CT HEAD WITHOUT CONTRAST TECHNIQUE: Contiguous axial images were obtained from the base of the skull through the vertex without intravenous contrast. RADIATION DOSE REDUCTION: This exam was performed according to the departmental dose-optimization program which includes automated exposure control, adjustment of the mA and/or kV according to patient size and/or use of iterative reconstruction technique. COMPARISON:  Apr 24, 2024 FINDINGS: Brain: There is generalized cerebral atrophy with widening of the extra-axial spaces and ventricular dilatation. There are areas of decreased attenuation within the white matter tracts of the supratentorial brain, consistent with microvascular disease changes. Vascular: No hyperdense vessel or unexpected calcification. Skull: Normal. Negative for fracture or focal lesion. Sinuses/Orbits: No acute finding. Other: None. IMPRESSION: 1. No acute intracranial abnormality. 2. Generalized cerebral atrophy and microvascular disease changes of the supratentorial brain. Electronically Signed   By: Suzen Dials M.D.   On: 10/04/2024 15:33   DG Chest Portable 1 View Result Date: 10/04/2024 CLINICAL DATA:  Status post fall. EXAM: PORTABLE CHEST 1 VIEW COMPARISON:  December 15, 2023 FINDINGS: The heart size and mediastinal contours are within normal limits. Low lung volumes are noted. Both lungs are clear. Radiopaque surgical clips are seen overlying the right upper quadrant. Multilevel degenerative changes are present throughout the thoracic spine. IMPRESSION: Low lung volumes without active cardiopulmonary disease. Electronically Signed   By: Suzen Dials M.D.   On: 10/04/2024 15:31   DG Pelvis Portable Result Date: 10/04/2024 CLINICAL DATA:  Fall. EXAM: DG PORTABLE PELVIS COMPARISON:  12/15/2023. FINDINGS: Pelvis is intact with normal and symmetric sacroiliac joints. No acute fracture or dislocation. No  aggressive osseous lesion. Visualized sacral arcuate lines are unremarkable. Unremarkable symphysis pubis. There are mild degenerative changes of bilateral hip joints without significant joint space narrowing. Osteophytosis of the superior acetabulum. No radiopaque foreign bodies. IMPRESSION: No  acute osseous abnormality of the pelvis. Electronically Signed   By: Ree Molt M.D.   On: 10/04/2024 15:16     .Critical Care  Performed by: Ruthe Cornet, DO Authorized by: Ruthe Cornet, DO   Critical care provider statement:    Critical care time (minutes):  35   Critical care was necessary to treat or prevent imminent or life-threatening deterioration of the following conditions: atrial fibrillation with rvr.   Critical care was time spent personally by me on the following activities:  Blood draw for specimens, development of treatment plan with patient or surrogate, evaluation of patient's response to treatment, discussions with primary provider, examination of patient, obtaining history from patient or surrogate, ordering and review of laboratory studies, ordering and performing treatments and interventions, ordering and review of radiographic studies, pulse oximetry, re-evaluation of patient's condition and review of old charts   Care discussed with: admitting provider      Medications Ordered in the ED  sodium chloride  0.9 % bolus 1,000 mL (0 mLs Intravenous Stopped 10/04/24 1521)  diltiazem (CARDIZEM) 1 mg/mL load via infusion 10 mg (10 mg Intravenous Bolus from Bag 10/04/24 1528)    And  diltiazem (CARDIZEM) 125 mg in dextrose  5% 125 mL (1 mg/mL) infusion (5 mg/hr Intravenous New Bag/Given 10/04/24 1527)  insulin  aspart (novoLOG ) injection 0-9 Units (has no administration in time range)  insulin  aspart (novoLOG ) injection 0-5 Units (has no administration in time range)  acetaminophen  (TYLENOL ) tablet 650 mg (has no administration in time range)    Or  acetaminophen  (TYLENOL )  suppository 650 mg (has no administration in time range)  polyethylene glycol (MIRALAX / GLYCOLAX) packet 17 g (has no administration in time range)  bisacodyl (DULCOLAX) EC tablet 5 mg (has no administration in time range)  albuterol  (PROVENTIL ) (2.5 MG/3ML) 0.083% nebulizer solution 2.5 mg (has no administration in time range)  hydrALAZINE (APRESOLINE) injection 10 mg (has no administration in time range)  0.9 %  sodium chloride  infusion ( Intravenous New Bag/Given 10/04/24 1902)  HYDROmorphone (DILAUDID) injection 0.5 mg (has no administration in time range)  HYDROcodone -acetaminophen  (NORCO) 10-325 MG per tablet 1 tablet (has no administration in time range)  aspirin  EC tablet 81 mg (has no administration in time range)  flecainide  (TAMBOCOR ) tablet 75 mg (has no administration in time range)  zolpidem  (AMBIEN ) tablet 5 mg (has no administration in time range)  colesevelam [facility]) tablet 625-1,875 mg (has no administration in time range)  Warfarin - Pharmacist Dosing Inpatient (has no administration in time range)  potassium chloride  SA (KLOR-CON  M) CR tablet 40 mEq (40 mEq Oral Given 10/04/24 1859)  warfarin (COUMADIN ) tablet 5 mg (5 mg Oral Given 10/04/24 2023)                                    Medical Decision Making Amount and/or Complexity of Data Reviewed Labs: ordered.  Risk Prescription drug management. Decision regarding hospitalization.   Peggy Chen is here with fall failure to thrive.  She fell 2 days ago tripped using her walker.  She lives by herself.  She has chronic bad knees but was too weak to get up on her own.  She was not able to get to her phone.  She spent most of the time crawling around on the floor.  Eventually a friend who she talks to every day was able to get a wellness check after her house.  She has a history of A-fib on Coumadin .  History of back pain hypertension diabetes.  Ultimately she is having some left hip pain but she looks clinically  pretty dry I think she is decompensated strength wise.  She is in A-fib with RVR.  Will give her a fluid bolus start her on IV diltiazem bolus and infusion and check basic labs looking for electrolyte abnormality dehydration rhabdomyolysis infectious process.  Is not complaining of any headache or midline spinal pain.  Will get a CT of the head and neck will get chest x-ray pelvic x-ray.  Anticipate admission given clinical and physical decompensation here over the last few days.  Would not be safe to send her home in this poor condition.  Per my review interpretation of labs white count 17 creatinine is up to 1.7.  CK is 410.  INR is at goal at 2.3.  Head and neck CT are unremarkable.  Chest x-ray with no evidence of pneumonia.  Heart rate is improving with IV fluids IV diltiazem.  Overall we will admit her for further hydration PT OT.  Patient is too unsafe to discharge back to home given her decompensation.  Urinalysis left consistent with infection.  Will give antibiotics.  No concern for sepsis at this time.  This chart was dictated using voice recognition software.  Despite best efforts to proofread,  errors can occur which can change the documentation meaning.      Final diagnoses:  Fall, initial encounter  Failure to thrive in adult  Atrial fibrillation with RVR (HCC)  Acute cystitis without hematuria    ED Discharge Orders     None          Ruthe Cornet, DO 10/04/24 1708    Ruthe Cornet, DO 10/04/24 2104

## 2024-10-04 NOTE — Progress Notes (Signed)
 PHARMACY - ANTICOAGULATION CONSULT NOTE  Pharmacy Consult for warfarin Indication: atrial fibrillation  Allergies  Allergen Reactions   Penicillins Hives        Patient Measurements: Height: 5' 4 (162.6 cm) Weight: 74.4 kg (164 lb) IBW/kg (Calculated) : 54.7 HEPARIN DW (KG): 70.2  Vital Signs: Temp: 97.7 F (36.5 C) (10/29 1733) Temp Source: Oral (10/29 1733) BP: 132/88 (10/29 1730) Pulse Rate: 91 (10/29 1730)  Labs: Recent Labs    10/04/24 1436  HGB 15.4*  HCT 47.3*  PLT 310  APTT 45*  LABPROT 26.4*  INR 2.3*  CREATININE 1.69*  CKTOTAL 410*    Estimated Creatinine Clearance: 28.9 mL/min (A) (by C-G formula based on SCr of 1.69 mg/dL (H)).   Medical History: Past Medical History:  Diagnosis Date   Anxiety    Arthritis    Asthma    Bronchitis   Atherosclerosis 11/25/2017   noted on CT   Back pain    Depression    Diabetes mellitus    Diverticulosis 11/25/2017   mild, noted on CT   Fatty liver 11/25/2017   noted on CT   First degree AV block    History of kidney stones    Hydronephrosis 11/25/2017   mild left noted on CT   Hyperlipidemia    Hypertension    PAF (paroxysmal atrial fibrillation) (HCC)    Renal cyst 11/2017   Left, noted on US    Right renal artery stenosis    Sinus bradycardia    Skin cancer    questionable skin cancer on face years ago   UTI (urinary tract infection)    Medications:  Scheduled:   [START ON 10/05/2024] aspirin  EC  81 mg Oral Daily   flecainide   75 mg Oral Q12H   insulin  aspart  0-5 Units Subcutaneous QHS   [START ON 10/05/2024] insulin  aspart  0-9 Units Subcutaneous TID WC   zolpidem   5 mg Oral QHS   Infusions:   sodium chloride  100 mL/hr at 10/04/24 1902   diltiazem (CARDIZEM) infusion 5 mg/hr (10/04/24 1527)   sodium chloride  Stopped (10/04/24 1521)   Assessment: Patient is a 29 YOF presenting to the ED in setting of a fall on 10/27 and unable to get up since. Patient with history of atrial  fibrillation anticoagulated PTA with warfarin. Pharmacy has been consulted to dose warfarin for afib.   Initial INR 2.3. Patient's home regimen is warfarin 5mg  tablets; take 1 tablet Mondays and Wednesdays and take 1/2 tablet all other days as confirmed by patient. Patient reports has not taken a dose of warfarin yet today. Hgb and plt WNL.   Of note, patient reports she is taking warfarin instead of a DOAC for afib solely due to cost/insurance reasons, but states her insurance has recently changed and should now cover Eliquis. She would like to transition to Eliquis if insurance would cover it.   Goal of Therapy:  INR 2-3 Monitor platelets by anticoagulation protocol: Yes   Plan:  -Give warfarin 5 mg po x1 tonight.  -Monitor CBC, INR daily.  -Can submit price check for Eliquis tomorrow AM; defer to primary team about possibility of switching anticoagulation while admitted.  Maurilio Patten, PharmD PGY1 Pharmacy Resident [facility] 10/04/2024 7:12 PM

## 2024-10-04 NOTE — ED Provider Triage Note (Signed)
 Emergency Medicine Provider Triage Evaluation Note  Peggy Chen , a 73 y.o. female  was evaluated in triage.  Pt complains of fall, fell on Monday evening, has not been able to get off the ground until today when a friend sent out for a wellness check.  She reports that she has been scooting around, she denies any severe pain other than her chronic back pain, and some head pain, reporting that she does not member hitting her head during her initial fall but did hit her head while scoot around on the floor..  Review of Systems  Positive: Fall, down on ground for days Negative:   Physical Exam  Ht 5' 4 (1.626 m)   Wt 74.4 kg   BMI 28.15 kg/m  Gen:   Awake, no distress   Resp:  Normal effort  MSK:   Moves extremities without difficulty --no obvious signs of trauma bilateral upper or lower extremities. Other:  Some redness around tailbone, no ulcers, no other significant areas of redness, excoriation, or lacerations  Medical Decision Making  Medically screening exam initiated at 2:34 PM.  Appropriate orders placed.  Peggy Chen was informed that the remainder of the evaluation will be completed by another provider, this initial triage assessment does not replace that evaluation, and the importance of remaining in the ED until their evaluation is complete.  Workup initiated in triage    Peggy Sherlean DEL, PA-C 10/04/24 1440

## 2024-10-04 NOTE — ED Notes (Signed)
 PA at bedside doing an exam. Pt complains only of chronic back pain and a little head pain. No abrasions. No signs of obvious trauma per PA. Patient denies LOC.

## 2024-10-04 NOTE — H&P (Signed)
 [facility] History and Physical  Peggy Chen FMW:[id number] DOB: [birth year] DOA: 10/04/2024 PCP: Avva, Ravisankar, MD  Presented from: home Chief Complaint: fall  History of Present Illness: Peggy Chen is a 73 y.o. female with PMH significant for DM2, HTN, HLD, paroxysmal A-fib on Coumadin , sinus bradycardia, first-degree AV block, arthritis, impaired mobility, diverticulosis, anxiety, depression Lives at home.  Has difficulty with ambulation.  At baseline, uses a walker.  Patient actually had a fall on the evening of Nov 06, 2024 10/27. It sounds like her walker hit something while she was going to the bathroom.  She fell to the ground, did not hit her head, did not pass out, was too weak to get up on her own.  She spent most of the time crawling around the floor.  Not having heard from her for 2 days, a friend of her called welfare check.  EMS found on the floor, complaining of left hip pain and brought to the ED.  In the ED, patient was afebrile, Mildly tachycardic, blood pressure in 140s, breathing on room air EKG with A-fib with RVR at 140 bpm, QTc 562 ms Initial labs with WC count 17.2, hemoglobin 15.4, INR 2.3, potassium 3.3, BUN/creatinine 38/1.69, CK 410 Chest x-ray unremarkable for acute findings X-ray pelvis unremarkable CT head did not show any acute intracranial abnormality, showed generalized cerebral atrophy and chronic microvascular ischemic changes CT cervical spine did not show any acute fracture, showed multilevel degenerative changes most prominent at level C4-C5 and C6-C7  In the ED, she was started on IV fluid. For A-fib with RVR, she was given IV diltiazem bolus and started on drip. Hospitalist service was consulted for further workup and management.  At the time of my evaluation, patient is alert, awake, oriented x 3.  Able to answer questions. No family at bedside. History reviewed and detailed as above. Lives alone.  Husband died in 76, daughter  died in 2017-11-06.  Has 2 cats. On the monitor, A-fib with RVR between 110 and 120 bpm  Review of Systems:  All systems were reviewed and were negative unless otherwise mentioned in the HPI   Past medical history: Past Medical History:  Diagnosis Date   Anxiety    Arthritis    Asthma    Bronchitis   Atherosclerosis 11/25/2017   noted on CT   Back pain    Depression    Diabetes mellitus    Diverticulosis 11/25/2017   mild, noted on CT   Fatty liver 11/25/2017   noted on CT   First degree AV block    History of kidney stones    Hydronephrosis 11/25/2017   mild left noted on CT   Hyperlipidemia    Hypertension    PAF (paroxysmal atrial fibrillation) (HCC)    Renal cyst 11/2017   Left, noted on US    Right renal artery stenosis    Sinus bradycardia    Skin cancer    questionable skin cancer on face years ago   UTI (urinary tract infection)     Past surgical history: Past Surgical History:  Procedure Laterality Date   BREAST CYST REMOVAL     RIGHT BREAST   CARDIOVERSION N/A 04/17/2014   Procedure: CARDIOVERSION;  Surgeon: Aleene JINNY Passe, MD;  Location: [facility] ENDOSCOPY;  Service: Cardiovascular;  Laterality: N/A;   CARDIOVERSION  05/2014   attempted DC failed   COLON SURGERY     May 2000   COLONOSCOPY     CYSTOSCOPY/URETEROSCOPY/HOLMIUM LASER/STENT PLACEMENT Left 11/26/2017  Procedure: CYSTOSCOPY/STENT PLACEMENT;  Surgeon: Renda Glance, MD;  Location: [facility] ORS;  Service: Urology;  Laterality: Left;   CYSTOSCOPY/URETEROSCOPY/HOLMIUM LASER/STENT PLACEMENT Left 12/23/2017   Procedure: CYSTOSCOPY/ LEFT URETEROSCOPY;  Surgeon: Renda Glance, MD;  Location: [facility] ORS;  Service: Urology;  Laterality: Left;   TONSILLECTOMY     US  ECHOCARDIOGRAPHY  12/30/2009   EF 55-60%    Social History:  reports that she quit smoking about 38 years ago. Her smoking use included cigarettes. She has never used smokeless tobacco. She reports current alcohol use. She reports that she does not use  drugs.  Allergies:  Allergies  Allergen Reactions   Penicillins Hives    Has patient had a PCN reaction causing immediate rash, facial/tongue/throat swelling, SOB or lightheadedness with hypotension: Hives Has patient had a PCN reaction causing severe rash involving mucus membranes or skin necrosis: Unk Has patient had a PCN reaction that required hospitalization: No Has patient had a PCN reaction occurring within the last 10 years: No If all of the above answers are NO, then may proceed with Cephalosporin use.    Penicillins   Family history:  Family History  Problem Relation Age of Onset   COPD Mother    Heart attack Father      Physical Exam: Vitals:   10/04/24 1531 10/04/24 1533 10/04/24 1730 10/04/24 1733  BP:  (!) 163/110 132/88   Pulse:  63 91   Resp:  (!) 23 (!) 28   Temp: 98.4 F (36.9 C) 97.8 F (36.6 C)  97.7 F (36.5 C)  TempSrc: Oral Oral  Oral  SpO2:  96% 97%   Weight:      Height:       Wt Readings from Last 3 Encounters:  10/04/24 74.4 kg  04/24/24 77.1 kg  03/07/24 76.4 kg   Body mass index is 28.15 kg/m.  General exam: Pleasant, elderly Caucasian female.  Not in distress Skin: No rashes, lesions or ulcers. HEENT: Atraumatic, normocephalic, no obvious bleeding Lungs: Clear to auscultation bilaterally,  CVS: Telemetry with irregular heart rhythm, between 110 and 120 bpm.  S1, S2, no murmur GI/Abd: Soft, nontender, nondistended, bowel sound present,   CNS: Alert, awake, oriented x 3 Psychiatry: Sad affect Extremities: No pedal edema, no calf tenderness,     ----------------------------------------------------------------------------------------------------------------------------------------- ----------------------------------------------------------------------------------------------------------------------------------------- -----------------------------------------------------------------------------------------------------------------------------------------  Assessment/Plan: Principal Problem:   Rhabdomyolysis  AKI on CKD 3B Rhabdomyolysis Was on the floor for 2 days, unable to get up CK elevated over 400.   Baseline creatinine less than 1.4.  Presented with creatinine elevated to 1.69 Clinically looks dehydrated. Start normal saline at 100 mL/h Continue to monitor labs Recent Labs  Lab 10/04/24 1436  CKTOTAL 410*   Recent Labs    10/30/23 1913 10/30/23 1941 10/31/23 0247 11/01/23 0233 11/02/23 0255 12/15/23 2030 04/24/24 1900 10/04/24 1436  BUN 44* 43* 42* 36* 29* 21 33* 38*  CREATININE 2.10* 2.10* 2.04* 1.54* 1.52* 1.44* 1.36* 1.69*  CO2 27  --  24 24 25 25 22  21*   Fall Impaired mobility It seems the fall was mechanical in nature.   Imagings negative for fracture Obtain PT eval  Leukocytosis Likely secondary to dehydration and stress.  No evidence of infection.  Continue to monitor Recent Labs  Lab 10/04/24 1436  WBC 17.2*   A-fib with RVR  paroxysmal A-fib H/o first-degree AV block PTA meds-Flecainide  75 mg twice daily Has not been on it for last 2 days at least after the fall. In the ED, on RVR up  to 120s.  On Cardizem drip currently running at 10 mg/per hour. Resume flecainide  Chronically anticoagulated with Coumadin  INR therapeutic between 2 and 3 Recent Labs  Lab 10/04/24 1436  INR 2.3*    Prolonged Qtc Initial EKG with QTc prolonged at 462 ms Monitor electrolytes Repeat EKG tomorrow  Hypokalemia Potassium low at 3.3.  Ordered replacement Recent Labs  Lab 10/04/24 1436  K 3.3*    Type 2 diabetes mellitus A1c 6.7 in 2019.  Repeat A1c PTA meds-linagliptin, metformin.  Keep them on Hold Start SSI/Accu-Cheks Lab Results  Component Value Date   HGBA1C 6.1 (H) 10/04/2024   Recent Labs  Lab 10/04/24 1441  GLUCAP 147*   HTN PTA meds- amlodipine  5 mg daily Keep on hold while on Cardizem drip  HLD Whelchol  Diverticulosis Chronic anemia H&H hemoglobin last shows low.  Hemoglobin this year has been in normal range.  Hemoconcentrated currently.  Continue to monitor. Recent Labs    11/01/23 0905 11/02/23 0255 12/15/23 2030 04/24/24 1900 10/04/24 1436  HGB 7.0* 8.4* 13.7 13.3 15.4*  MCV  --  93.0 88.9 92.0 91.1   Anxiety/depression Nightly Ambien   Chronic arthritic pain PTA meds- Norco PRN Resume it.  Mobility: PT eval ordered PT Orders:   PT Follow up Rec:    Goals of care:   Code Status: Full Code.  Confirmed with patient   DVT prophylaxis:  enoxaparin (LOVENOX) injection 40 mg Start: 10/04/24 1745   Antimicrobials: None Fluid: Start NS at 100 mL/h Consultants: None Family Communication: None at bedside  Status: Observation Level of care:  Telemetry   Patient is from: Home Anticipated d/c to: Pending clinical course, may need SNF  Diet:  Diet Order             Diet regular Room service appropriate? Yes; Fluid consistency: Thin  Diet effective now                    ------------------------------------------------------------------------------------- Severity of Illness: The appropriate patient status for this patient is OBSERVATION. Observation status is judged to be reasonable and necessary in order to provide the required intensity of service to ensure the patient's safety. The patient's presenting symptoms, physical exam findings, and initial radiographic and laboratory data in the context of their medical condition is felt to place them at decreased risk for further clinical deterioration. Furthermore, it is  anticipated that the patient will be medically stable for discharge from the hospital within 2 midnights of admission.  -------------------------------------------------------------------------------------  Home Meds: Prior to Admission medications   Medication Sig Start Date End Date Taking? Authorizing Provider  amLODipine  (NORVASC ) 5 MG tablet Take 5 mg by mouth daily. 08/11/16   [provider]  aspirin  81 MG tablet Take 81 mg by mouth daily.  01/05/11   [provider]  colesevelam (WELCHOL) 625 MG tablet Take 1-3 tablets by mouth as needed (for colon). 03/28/20   [provider]  cyanocobalamin 1000 MCG tablet Take 1,000 mcg by mouth daily.     [provider]  DULoxetine  (CYMBALTA ) 60 MG capsule Take 60 mg by mouth daily.    [provider]  flecainide  (TAMBOCOR ) 150 MG tablet TAKE 1/2 TABLET (75 MG TOTAL) BY MOUTH TWICE A DAY 03/22/24   Nahser, Aleene PARAS, MD  HYDROcodone -acetaminophen  (NORCO) 10-325 MG tablet Take 1 tablet by mouth in the morning, at noon, in the evening, and at bedtime.    [provider]  JENTADUETO 2.04-999 MG TABS Take 1 tablet  by mouth 2 (two) times daily. 09/02/19   [provider]  magnesium  30 MG tablet Take 1 tablet (30 mg total) by mouth daily. Patient not taking: Reported on 03/07/2024 12/16/23   Horton, Roxie HERO, DO  metoprolol  succinate (TOPROL -XL) 25 MG 24 hr tablet Take 1 tablet (25 mg total) by mouth daily. Patient not taking: Reported on 03/07/2024 11/02/23 12/15/23  Leotis Bogus, MD  potassium chloride  SA (KLOR-CON  M) 20 MEQ tablet Take 2 tablets (40 mEq total) by mouth daily. Patient not taking: Reported on 03/07/2024 12/16/23   Horton, Kristie M, DO  warfarin (COUMADIN ) 5 MG tablet Take 0.5 tablets (2.5 mg total) by mouth daily at 4 PM. Take 1/2 tablet (2.5 mg total) by mouth daily at 4 PM or as directed by office Patient taking differently: Take 2.5-5 mg by mouth daily at 4 PM. Take 5 mg by mouth on  Mondays and Wednesdays, then take 1/2 tablet (2.5 mg total) by mouth rest of days per patient 11/02/23   Leotis Bogus, MD  zolpidem  (AMBIEN ) 5 MG tablet Take 5 mg by mouth at bedtime. 05/27/20   [provider]    Labs on Admission:   CBC: Recent Labs  Lab 10/04/24 1436  WBC 17.2*  HGB 15.4*  HCT 47.3*  MCV 91.1  PLT 310    Basic Metabolic Panel: Recent Labs  Lab 10/04/24 1436  NA 140  K 3.3*  CL 101  CO2 21*  GLUCOSE 158*  BUN 38*  CREATININE 1.69*  CALCIUM 9.7    Liver Function Tests: Recent Labs  Lab 10/04/24 1436  AST 31  ALT 17  ALKPHOS 65  BILITOT 1.4*  PROT 7.5  ALBUMIN 3.6   No results for input(s): LIPASE, AMYLASE in the last 168 hours. No results for input(s): AMMONIA in the last 168 hours.  Cardiac Enzymes: Recent Labs  Lab 10/04/24 1436  CKTOTAL 410*    BNP (last 3 results) No results for input(s): BNP in the last 8760 hours.  ProBNP (last 3 results) No results for input(s): PROBNP in the last 8760 hours.  CBG: Recent Labs  Lab 10/04/24 1441  GLUCAP 147*    Lipase  No results found for: LIPASE   Urinalysis    Component Value Date/Time   COLORURINE YELLOW 10/04/2024 1753   APPEARANCEUR CLOUDY (A) 10/04/2024 1753   LABSPEC 1.019 10/04/2024 1753   PHURINE 5.0 10/04/2024 1753   GLUCOSEU NEGATIVE 10/04/2024 1753   HGBUR MODERATE (A) 10/04/2024 1753   BILIRUBINUR NEGATIVE 10/04/2024 1753   KETONESUR 5 (A) 10/04/2024 1753   PROTEINUR 100 (A) 10/04/2024 1753   NITRITE POSITIVE (A) 10/04/2024 1753   LEUKOCYTESUR MODERATE (A) 10/04/2024 1753     Drugs of Abuse  No results found for: LABOPIA, COCAINSCRNUR, LABBENZ, AMPHETMU, THCU, LABBARB    Radiological Exams on Admission: CT Cervical Spine Wo Contrast Result Date: 10/04/2024 CLINICAL DATA:  Status post fall. EXAM: CT CERVICAL SPINE WITHOUT CONTRAST TECHNIQUE: Multidetector CT imaging of the cervical spine was performed without intravenous  contrast. Multiplanar CT image reconstructions were also generated. RADIATION DOSE REDUCTION: This exam was performed according to the departmental dose-optimization program which includes automated exposure control, adjustment of the mA and/or kV according to patient size and/or use of iterative reconstruction technique. COMPARISON:  Apr 24, 2024 FINDINGS: Alignment: There is straightening of the normal cervical spine lordosis. Stable, chronic approximately 1 mm to 2 mm retrolisthesis of the C4 vertebral body is noted on C5 with 1 mm to 2  mm anterolisthesis of C5 on C6. Skull base and vertebrae: No acute fracture. No primary bone lesion or focal pathologic process. Soft tissues and spinal canal: No prevertebral fluid or swelling. No visible canal hematoma. Disc levels: There is mild anterior osteophyte formation at the level of C3-C4 and C5-C6, with mild to moderate severity endplate sclerosis, anterior osteophyte formation and posterior bony spurring at the levels of C4-C5 and C6-C7. There is moderate to marked severity intervertebral disc space narrowing at C4-C5 with marked severity intervertebral disc space narrowing at C6-C7. Bilateral moderate severity multilevel facet joint hypertrophy is noted. Upper chest: Negative. Other: None. IMPRESSION: 1. No acute cervical spine fracture. 2. Multilevel degenerative changes, most prominent at the levels of C4-C5 and C6-C7. Electronically Signed   By: Suzen Dials M.D.   On: 10/04/2024 15:38   CT Head Wo Contrast Result Date: 10/04/2024 CLINICAL DATA:  Status post fall. EXAM: CT HEAD WITHOUT CONTRAST TECHNIQUE: Contiguous axial images were obtained from the base of the skull through the vertex without intravenous contrast. RADIATION DOSE REDUCTION: This exam was performed according to the departmental dose-optimization program which includes automated exposure control, adjustment of the mA and/or kV according to patient size and/or use of iterative  reconstruction technique. COMPARISON:  Apr 24, 2024 FINDINGS: Brain: There is generalized cerebral atrophy with widening of the extra-axial spaces and ventricular dilatation. There are areas of decreased attenuation within the white matter tracts of the supratentorial brain, consistent with microvascular disease changes. Vascular: No hyperdense vessel or unexpected calcification. Skull: Normal. Negative for fracture or focal lesion. Sinuses/Orbits: No acute finding. Other: None. IMPRESSION: 1. No acute intracranial abnormality. 2. Generalized cerebral atrophy and microvascular disease changes of the supratentorial brain. Electronically Signed   By: Suzen Dials M.D.   On: 10/04/2024 15:33   DG Chest Portable 1 View Result Date: 10/04/2024 CLINICAL DATA:  Status post fall. EXAM: PORTABLE CHEST 1 VIEW COMPARISON:  December 15, 2023 FINDINGS: The heart size and mediastinal contours are within normal limits. Low lung volumes are noted. Both lungs are clear. Radiopaque surgical clips are seen overlying the right upper quadrant. Multilevel degenerative changes are present throughout the thoracic spine. IMPRESSION: Low lung volumes without active cardiopulmonary disease. Electronically Signed   By: Suzen Dials M.D.   On: 10/04/2024 15:31   DG Pelvis Portable Result Date: 10/04/2024 CLINICAL DATA:  Fall. EXAM: DG PORTABLE PELVIS COMPARISON:  12/15/2023. FINDINGS: Pelvis is intact with normal and symmetric sacroiliac joints. No acute fracture or dislocation. No aggressive osseous lesion. Visualized sacral arcuate lines are unremarkable. Unremarkable symphysis pubis. There are mild degenerative changes of bilateral hip joints without significant joint space narrowing. Osteophytosis of the superior acetabulum. No radiopaque foreign bodies. IMPRESSION: No acute osseous abnormality of the pelvis. Electronically Signed   By: Ree Molt M.D.   On: 10/04/2024 15:16     Signed, Chapman Rota, MD [facility] 10/04/2024

## 2024-10-04 NOTE — ED Triage Notes (Signed)
 Pt was BIB [facility] from home with complaints of a fall. EMS states that patient fell on Monday evening and has been on the floor until today when a friend hadnt heard from her and called for her to be checked on. She did hit her head and she is on coumadin . Pt does use mobility aids around the house.  160/84 48*52 irregular PMX AFIB 16 RR 96% RA

## 2024-10-05 ENCOUNTER — Other Ambulatory Visit (HOSPITAL_COMMUNITY): Payer: Self-pay

## 2024-10-05 ENCOUNTER — Telehealth (HOSPITAL_COMMUNITY): Payer: Self-pay

## 2024-10-05 DIAGNOSIS — T796XXA Traumatic ischemia of muscle, initial encounter: Secondary | ICD-10-CM | POA: Diagnosis not present

## 2024-10-05 LAB — CBC
HCT: 39.6 % (ref 36.0–46.0)
Hemoglobin: 12.9 g/dL (ref 12.0–15.0)
MCH: 29.8 pg (ref 26.0–34.0)
MCHC: 32.6 g/dL (ref 30.0–36.0)
MCV: 91.5 fL (ref 80.0–100.0)
Platelets: 292 K/uL (ref 150–400)
RBC: 4.33 MIL/uL (ref 3.87–5.11)
RDW: 13.1 % (ref 11.5–15.5)
WBC: 16.5 K/uL — ABNORMAL HIGH (ref 4.0–10.5)
nRBC: 0 % (ref 0.0–0.2)

## 2024-10-05 LAB — PHOSPHORUS: Phosphorus: 3.1 mg/dL (ref 2.5–4.6)

## 2024-10-05 LAB — CK: Total CK: 274 U/L — ABNORMAL HIGH (ref 38–234)

## 2024-10-05 LAB — GLUCOSE, CAPILLARY
Glucose-Capillary: 296 mg/dL — ABNORMAL HIGH (ref 70–99)
Glucose-Capillary: 145 mg/dL — ABNORMAL HIGH (ref 70–99)
Glucose-Capillary: 174 mg/dL — ABNORMAL HIGH (ref 70–99)
Glucose-Capillary: 178 mg/dL — ABNORMAL HIGH (ref 70–99)

## 2024-10-05 LAB — PROTIME-INR
INR: 2.4 — ABNORMAL HIGH (ref 0.8–1.2)
Prothrombin Time: 27.5 s — ABNORMAL HIGH (ref 11.4–15.2)

## 2024-10-05 LAB — BASIC METABOLIC PANEL WITH GFR
Anion gap: 16 — ABNORMAL HIGH (ref 5–15)
BUN: 37 mg/dL — ABNORMAL HIGH (ref 8–23)
CO2: 22 mmol/L (ref 22–32)
Calcium: 9 mg/dL (ref 8.9–10.3)
Chloride: 101 mmol/L (ref 98–111)
Creatinine, Ser: 1.43 mg/dL — ABNORMAL HIGH (ref 0.44–1.00)
GFR, Estimated: 38 mL/min — ABNORMAL LOW (ref >60)
Glucose, Bld: 117 mg/dL — ABNORMAL HIGH (ref 70–99)
Potassium: 3.3 mmol/L — ABNORMAL LOW (ref 3.5–5.1)
Sodium: 139 mmol/L (ref 135–145)

## 2024-10-05 LAB — MAGNESIUM: Magnesium: 1.4 mg/dL — ABNORMAL LOW (ref 1.7–2.4)

## 2024-10-05 MED ORDER — INFLUENZA VAC SPLIT HIGH-DOSE 0.5 ML IM SUSY
0.5000 mL | PREFILLED_SYRINGE | INTRAMUSCULAR | Status: AC
  Administered 2024-10-10: 0.5 mL via INTRAMUSCULAR
  Filled 2024-10-05 (×2): qty 0.5

## 2024-10-05 MED ORDER — SODIUM CHLORIDE 0.9 % IV SOLN
1.0000 g | INTRAVENOUS | Status: DC
  Administered 2024-10-05 – 2024-10-07 (×3): 1 g via INTRAVENOUS
  Filled 2024-10-05 (×3): qty 10

## 2024-10-05 MED ORDER — WARFARIN SODIUM 2.5 MG PO TABS
2.5000 mg | ORAL_TABLET | Freq: Once | ORAL | Status: AC
  Administered 2024-10-05: 2.5 mg via ORAL
  Filled 2024-10-05: qty 1

## 2024-10-05 MED ORDER — MAGNESIUM SULFATE 2 GM/50ML IV SOLN
2.0000 g | Freq: Once | INTRAVENOUS | Status: AC
  Administered 2024-10-05: 2 g via INTRAVENOUS
  Filled 2024-10-05: qty 50

## 2024-10-05 MED ORDER — POTASSIUM CHLORIDE CRYS ER 20 MEQ PO TBCR
40.0000 meq | EXTENDED_RELEASE_TABLET | Freq: Once | ORAL | Status: AC
  Administered 2024-10-05: 40 meq via ORAL
  Filled 2024-10-05: qty 2

## 2024-10-05 NOTE — Telephone Encounter (Signed)
 Pharmacy Patient Advocate Encounter  Insurance verification completed.    The patient is insured through [facility]. Patient has Medicare and is not eligible for a copay card, but may be able to apply for patient assistance or Medicare RX Payment Plan (Patient Must reach out to their plan, if eligible for payment plan), if available.    Ran test claim for Eliquis 5mg  and the current 30 day co-pay is $171.12.   This test claim was processed through [facility]- copay amounts may vary at other pharmacies due to pharmacy/plan contracts, or as the patient moves through the different stages of their insurance plan.

## 2024-10-05 NOTE — Progress Notes (Signed)
 PHARMACY - ANTICOAGULATION CONSULT NOTE  Pharmacy Consult for warfarin Indication: atrial fibrillation  Allergies  Allergen Reactions   Penicillins Hives        Patient Measurements: Height: 5' 4 (162.6 cm) Weight: 74.4 kg (164 lb) IBW/kg (Calculated) : 54.7 HEPARIN DW (KG): 70.2  Vital Signs: Temp: 98.3 F (36.8 C) (10/30 1034) Temp Source: Oral (10/30 1034) BP: 134/54 (10/30 1034) Pulse Rate: 94 (10/30 1034)  Labs: Recent Labs    10/04/24 1436 10/05/24 0549  HGB 15.4*  --   HCT 47.3*  --   PLT 310  --   APTT 45*  --   LABPROT 26.4* 27.5*  INR 2.3* 2.4*  CREATININE 1.69* 1.43*  CKTOTAL 410* 274*    Estimated Creatinine Clearance: 34.1 mL/min (A) (by C-G formula based on SCr of 1.43 mg/dL (H)).   Medical History: Past Medical History:  Diagnosis Date   Anxiety    Arthritis    Asthma    Bronchitis   Atherosclerosis 11/25/2017   noted on CT   Back pain    Depression    Diabetes mellitus    Diverticulosis 11/25/2017   mild, noted on CT   Fatty liver 11/25/2017   noted on CT   First degree AV block    History of kidney stones    Hydronephrosis 11/25/2017   mild left noted on CT   Hyperlipidemia    Hypertension    PAF (paroxysmal atrial fibrillation) (HCC)    Renal cyst 11/2017   Left, noted on US    Right renal artery stenosis    Sinus bradycardia    Skin cancer    questionable skin cancer on face years ago   UTI (urinary tract infection)    Medications:  Scheduled:   aspirin  EC  81 mg Oral Daily   flecainide   75 mg Oral Q12H   insulin  aspart  0-5 Units Subcutaneous QHS   insulin  aspart  0-9 Units Subcutaneous TID WC   potassium chloride   40 mEq Oral Once   Warfarin - Pharmacist Dosing Inpatient   Does not apply q1600   zolpidem   5 mg Oral QHS   Infusions:   sodium chloride  100 mL/hr at 10/05/24 0629   diltiazem (CARDIZEM) infusion 10 mg/hr (10/04/24 2202)   magnesium  sulfate bolus IVPB     sodium chloride  Stopped (10/04/24 1521)    Assessment: Patient is a 46 YOF presenting to the ED in setting of a fall on 10/27 and unable to get up since. Patient with history of atrial fibrillation anticoagulated PTA with warfarin. Pharmacy has been consulted to dose warfarin for afib.   Initial INR 2.3. Patient's home regimen is warfarin 5mg  tablets; take 1 tablet Mondays and Wednesdays and take 1/2 tablet all other days as confirmed by patient. Patient reports has not taken a dose of warfarin yet today. Hgb and plt WNL.   Of note, patient reports she is taking warfarin instead of a DOAC for afib solely due to cost/insurance reasons, but states her insurance has recently changed and should now cover Eliquis. She would like to transition to Eliquis if insurance would cover it.   Goal of Therapy:  INR 2-3 Monitor platelets by anticoagulation protocol: Yes   Plan:  -Give warfarin 2.5 mg po x1 tonight.  -Monitor CBC, INR daily.  Eliquis copay is $171.12   Benedetta Heath BS, PharmD, BCPS Clinical Pharmacist 10/05/2024 10:40 AM  Contact: [phone] after 3 PM

## 2024-10-05 NOTE — Evaluation (Signed)
 Physical Therapy Evaluation Patient Details Name: Peggy Chen MRN: [id number] DOB: [birth year] Today's Date: 10/05/2024  History of Present Illness  73 y.o. female presents to [facility] 10/04/24 after falling with pt on the ground for 2 days, unable to get up. Pt with rhabdomyolysis, UTI, and AKI. PMHx: DM2, HTN, HLD, paroxysmal A-fib on Coumadin , sinus bradycardia, first-degree AV block, arthritis, impaired mobility, diverticulosis, anxiety, depression   Clinical Impression  PTA pt ambulated short distances with use of rollator. Pt presents with pain in B LE's, decreased activity tolerance, difficulty motor planning/sequencing, and impaired balance/gait. Pt required ModA for bed mobility and CGA when seated on EOB. Attempted to stand x2 with use of RW and MaxA, however, pt unable to maintain upright posture due to an increase in leg pain. Transferred to/from [facility] with MaxA and Stedy. Increased time and effort to stand with assist needed to lower. Pt has intermittent assist available upon d/c home. Recommending <3hrs post acute rehab to improve mobility and prevent future falls. Acute PT to follow.         If plan is discharge home, recommend the following: A lot of help with walking and/or transfers;A lot of help with bathing/dressing/bathroom;Assistance with cooking/housework;Assist for transportation;Help with stairs or ramp for entrance   Can travel by private vehicle   No    Equipment Recommendations Rolling walker (2 wheels);BSC/3in1;Wheelchair (measurements PT);Wheelchair cushion (measurements PT)     Functional Status Assessment Patient has had a recent decline in their functional status and demonstrates the ability to make significant improvements in function in a reasonable and predictable amount of time.     Precautions / Restrictions Precautions Precautions: Fall Recall of Precautions/Restrictions: Intact Restrictions Weight Bearing Restrictions Per Provider Order: No       Mobility  Bed Mobility Overal bed mobility: Needs Assistance Bed Mobility: Supine to Sit    Supine to sit: Mod assist, Used rails    General bed mobility comments: ModA to shift hips forward and raise trunk.    Transfers Overall transfer level: Needs assistance Equipment used: Rolling walker (2 wheels), Ambulation equipment used Transfers: Sit to/from Stand, Bed to chair/wheelchair/BSC Sit to Stand: Max assist, Via lift equipment    General transfer comment: MaxA from EOB with RW with pt unable to reach upright position due to pain in legs. Transfer to [facility] and recliner with MaxA and Stedy. Increased time and effort throughout. Assist needed to guide hips posteriorly as pt was unable to lower w/o assist Transfer via Lift Equipment: Stedy  Ambulation/Gait  General Gait Details: unable this date    Balance Overall balance assessment: Needs assistance Sitting-balance support: No upper extremity supported, Feet supported Sitting balance-Leahy Scale: Fair     Standing balance support: Bilateral upper extremity supported, During functional activity, Reliant on assistive device for balance Standing balance-Leahy Scale: Poor Standing balance comment: reliant on UE and external support       Pertinent Vitals/Pain Pain Assessment Pain Assessment: Faces Faces Pain Scale: Hurts whole lot Pain Location: B LE Pain Descriptors / Indicators: Cramping, Discomfort Pain Intervention(s): Monitored during session, Limited activity within patient's tolerance, Repositioned    Home Living Family/patient expects to be discharged to:: Private residence Living Arrangements: Alone Available Help at Discharge: Friend(s);Available PRN/intermittently;Neighbor Type of Home: House Home Access: Stairs to enter Entrance Stairs-Rails: None Entrance Stairs-Number of Steps: 1   Home Layout: One level Home Equipment: Grab bars - tub/shower;Grab bars - toilet;Rollator (4 wheels);Cane - single  point;Hand held shower head;Shower seat Additional Comments: no  local family; has a cat    Prior Function Prior Level of Function : Independent/Modified Independent;Driving;History of Falls (last six months)    Mobility Comments: ModI with rollator. Estimated 5-6 falls due to losing balance. Unable to stand after last fall ADLs Comments: Largely Independent to Mod I but sometimes limited by back pain. Pt reports she has a paid housekeeper who comes once a month. Pt drives.     Extremity/Trunk Assessment   Upper Extremity Assessment Upper Extremity Assessment: Defer to OT evaluation    Lower Extremity Assessment Lower Extremity Assessment: Generalized weakness       Communication   Communication Communication: No apparent difficulties    Cognition Arousal: Alert Behavior During Therapy: WFL for tasks assessed/performed   PT - Cognitive impairments: No family/caregiver present to determine baseline, Problem solving, Sequencing    PT - Cognition Comments: Increased difficulty motor planning and sequening. Step by step cueing with increaed time to process Following commands: Impaired Following commands impaired: Follows one step commands with increased time, Follows multi-step commands inconsistently     Cueing Cueing Techniques: Verbal cues      PT Assessment Patient needs continued PT services  PT Problem List Decreased strength;Decreased activity tolerance;Decreased balance;Decreased mobility;Decreased knowledge of use of DME;Decreased safety awareness;Decreased cognition       PT Treatment Interventions DME instruction;Gait training;Functional mobility training;Therapeutic activities;Balance training;Therapeutic exercise;Neuromuscular re-education;Patient/family education    PT Goals (Current goals can be found in the Care Plan section)  Acute Rehab PT Goals Patient Stated Goal: to get stronger PT Goal Formulation: With patient Time For Goal Achievement:  10/19/24 Potential to Achieve Goals: Good    Frequency Min 2X/week        AM-PAC PT 6 Clicks Mobility  Outcome Measure Help needed turning from your back to your side while in a flat bed without using bedrails?: A Lot Help needed moving from lying on your back to sitting on the side of a flat bed without using bedrails?: A Lot Help needed moving to and from a bed to a chair (including a wheelchair)?: Total Help needed standing up from a chair using your arms (e.g., wheelchair or bedside chair)?: A Lot Help needed to walk in hospital room?: Total Help needed climbing 3-5 steps with a railing? : Total 6 Click Score: 9    End of Session Equipment Utilized During Treatment: Gait belt Activity Tolerance: Patient tolerated treatment well Patient left: in chair;with call bell/phone within reach;with chair alarm set Nurse Communication: Mobility status;Need for lift equipment PT Visit Diagnosis: Unsteadiness on feet (R26.81);Other abnormalities of gait and mobility (R26.89);Muscle weakness (generalized) (M62.81);History of falling (Z91.81)    Time: 1451-1536 PT Time Calculation (min) (ACUTE ONLY): 45 min   Charges:   PT Evaluation $PT Eval Low Complexity: 1 Low PT Treatments $Therapeutic Activity: 23-37 mins PT General Charges $$ ACUTE PT VISIT: 1 Visit        Kate ORN, PT, DPT Secure Chat Preferred  Rehab Office [phone]   Kate BRAVO Wendolyn 10/05/2024, 3:53 PM

## 2024-10-05 NOTE — Progress Notes (Signed)
 Patient arrived to the floor from the ED via stretcher at approximately 1014 in stable condition.

## 2024-10-05 NOTE — Progress Notes (Signed)
 PROGRESS NOTE  Peggy Chen  DOB: 1951/05/20  PCP: Avva, Ravisankar, MD FMW:[id number]  DOA: 10/04/2024  LOS: 0 days  Hospital Day: 2  Subjective: Patient was seen and examined this morning. Pleasant elderly Caucasian female.  Not in distress. Chart reviewed It seems Cardizem drip was stopped yesterday at 10 PM Afebrile, heart rate in 80s, blood pressure in normal range, breathing on room air Labs from this morning with potassium 3.3, creatinine better at 1.43, CK better at 274, INR 2.4  Brief narrative: Peggy Chen is a 73 y.o. female with PMH significant for DM2, HTN, HLD, paroxysmal A-fib on Coumadin , sinus bradycardia, first-degree AV block, arthritis, impaired mobility, diverticulosis, anxiety, depression Lives at home.  Has difficulty with ambulation.  At baseline, uses a walker.  Patient actually had a fall on the evening of Monday 10/27. It sounds like her walker hit something while she was going to the bathroom.  She fell to the ground, did not hit her head, did not pass out, was too weak to get up on her own.  She spent most of the time crawling around the floor.  Not having heard from her for 2 days, a friend of her called welfare check.  EMS found on the floor, complaining of left hip pain and brought to the ED.  In the ED, patient was afebrile, Mildly tachycardic, blood pressure in 140s, breathing on room air EKG with A-fib with RVR at 140 bpm, QTc 562 ms Initial labs with WC count 17.2, hemoglobin 15.4, INR 2.3, potassium 3.3, BUN/creatinine 38/1.69, CK 410 Chest x-ray unremarkable for acute findings X-ray pelvis unremarkable CT head did not show any acute intracranial abnormality, showed generalized cerebral atrophy and chronic microvascular ischemic changes CT cervical spine did not show any acute fracture, showed multilevel degenerative changes most prominent at level C4-C5 and C6-C7 Urinalysis showed cloudy yellow urine with moderate hemoglobin, moderate  leukocytes, positive nitrite and many bacteria  In the ED, she was started on IV Rocephin , IV fluid. For A-fib with RVR, she was given IV diltiazem bolus and started on drip. Hospitalist service was consulted for further workup and management.  Assessment/Plan: UTI Likely cause of weakness and fall  Urinalysis showed cloudy yellow urine with moderate hemoglobin, moderate leukocytes, positive nitrite and many bacteria Urine culture sent Currently on IV Rocephin  Recent Labs  Lab 10/04/24 1436 10/05/24 1040  WBC 17.2* 16.5*   AKI on CKD 3B Rhabdomyolysis Was on the floor for 2 days, unable to get up Looked dehydrated on presentation. Initial CK was elevated to over 400.   Baseline creatinine less than 1.4.  Presented with creatinine elevated to 1.69 With IV hydration, CK and creatinine are improving. Continue gentle hydration Recent Labs  Lab 10/04/24 1436 10/05/24 0549  CKTOTAL 410* 274*   Recent Labs    10/30/23 1913 10/30/23 1941 10/31/23 0247 11/01/23 0233 11/02/23 0255 12/15/23 2030 04/24/24 1900 10/04/24 1436 10/05/24 0549  BUN 44* 43* 42* 36* 29* 21 33* 38* 37*  CREATININE 2.10* 2.10* 2.04* 1.54* 1.52* 1.44* 1.36* 1.69* 1.43*  CO2 27  --  24 24 25 25 22  21* 22   Fall Impaired mobility It seems the fall was mechanical in nature.  Likely confounded by weakness from UTI. Imagings negative for fracture Obtain PT eval  Leukocytosis Likely secondary to dehydration and stress.  No evidence of infection.  Continue to monitor Recent Labs  Lab 10/04/24 1436 10/05/24 1040  WBC 17.2* 16.5*   A-fib with RVR  paroxysmal A-fib H/o first-degree AV block PTA meds-Flecainide  75 mg twice daily but missed it for 2 days at least after the fall. In the ED, on RVR up to 120s.  Started on on Cardizem drip.  Flecainide  was resumed. It seems Cardizem drip was stopped yesterday at 10 PM. Chronically anticoagulated with Coumadin .  Patient states she was on Eliquis in the  past but had to switch to Coumadin  few years ago because of the cost.  Her Eliquis co-pay is still high at $171.  Hence patient is not yet willing to switch to Eliquis. INR therapeutic between 2 and 3 Recent Labs  Lab 10/04/24 1436 10/05/24 0549  INR 2.3* 2.4*    Prolonged Qtc Initial EKG with QTc prolonged at 462 ms Monitor electrolytes Ordered to repeat EKG today  Hypokalemia/hypomagnesemia Labs from this morning with low potassium 3.3 and low magnesium  1.4.  Replacement ordered. Recent Labs  Lab 10/04/24 1436 10/05/24 0549  K 3.3* 3.3*  MG  --  1.4*  PHOS  --  3.1   Type 2 diabetes mellitus A1c 6.1 on 09/25/2024  PTA meds-linagliptin, metformin.  Currently on hold  Continue SSI/Accu-Cheks Recent Labs  Lab 10/04/24 1441 10/04/24 2219 10/05/24 1243  GLUCAP 147* 173* 296*   HTN PTA meds- amlodipine  5 mg daily Keep on hold while on Cardizem drip  HLD Continue Whelchol  Diverticulosis Chronic anemia H&H in normal range. Recent Labs    11/02/23 0255 12/15/23 2030 04/24/24 1900 10/04/24 1436 10/05/24 1040  HGB 8.4* 13.7 13.3 15.4* 12.9  MCV 93.0 88.9 92.0 91.1 91.5   Anxiety/depression Nightly Ambien   Chronic arthritic pain PTA meds- Norco PRN Resume it.  Mobility: PT eval ordered PT Orders:   PT Follow up Rec:    Goals of care:   Code Status: Full Code.  Confirmed with patient   DVT prophylaxis:   warfarin (COUMADIN ) tablet 2.5 mg   Antimicrobials: None Fluid: Reduce NS to 50 mL/h Consultants: None Family Communication: None at bedside  Status: Observation Level of care:  Telemetry   Patient is from: Home Anticipated d/c to: Pending clinical course, may need SNF  Diet:  Diet Order             Diet regular Room service appropriate? Yes; Fluid consistency: Thin  Diet effective now                    Scheduled Meds:  aspirin  EC  81 mg Oral Daily   flecainide   75 mg Oral Q12H   insulin  aspart  0-5 Units Subcutaneous QHS    insulin  aspart  0-9 Units Subcutaneous TID WC   warfarin  2.5 mg Oral ONCE-1600   Warfarin - Pharmacist Dosing Inpatient   Does not apply q1600   zolpidem   5 mg Oral QHS    PRN meds: acetaminophen  **OR** acetaminophen , albuterol , bisacodyl, colesevelam, hydrALAZINE, HYDROcodone -acetaminophen , HYDROmorphone (DILAUDID) injection, polyethylene glycol   Infusions:   sodium chloride  100 mL/hr at 10/05/24 0629   diltiazem (CARDIZEM) infusion 10 mg/hr (10/04/24 2202)   sodium chloride  Stopped (10/04/24 1521)    Antimicrobials: Anti-infectives (From admission, onward)    Start     Dose/Rate Route Frequency Ordered Stop   10/04/24 2115  cefTRIAXone  (ROCEPHIN ) 2 g in sodium chloride  0.9 % 100 mL IVPB  Status:  Discontinued        2 g 200 mL/hr over 30 Minutes Intravenous  Once 10/04/24 2106 10/04/24 2108   10/04/24 2115  cefTRIAXone  (ROCEPHIN ) 1 g  in sodium chloride  0.9 % 100 mL IVPB        1 g 200 mL/hr over 30 Minutes Intravenous  Once 10/04/24 2108 10/04/24 2359       Objective: Vitals:   10/05/24 0945 10/05/24 1034  BP: 96/78 (!) 134/54  Pulse: 100 94  Resp: 17 18  Temp:  98.3 F (36.8 C)  SpO2: 100% 98%    Intake/Output Summary (Last 24 hours) at 10/05/2024 1419 Last data filed at 10/04/2024 2202 Gross per 24 hour  Intake 64.75 ml  Output --  Net 64.75 ml   Filed Weights   10/04/24 1429  Weight: 74.4 kg   Weight change:  Body mass index is 28.15 kg/m.   Physical Exam: General exam: Pleasant, elderly Caucasian female.  Not in distress Skin: No rashes, lesions or ulcers. HEENT: Atraumatic, normocephalic, no obvious bleeding Lungs: Clear to auscultation bilaterally,  CVS: Rate controlled irregular rhythm, no murmur GI/Abd: Soft, nontender, nondistended, bowel sound present,   CNS: Alert, awake, oriented x 3. Psychiatry: Mood appropriate. Extremities: No pedal edema, no calf tenderness,   Data Review: I have personally reviewed the laboratory data and studies  available.  F/u labs ordered Unresulted Labs (From admission, onward)     Start     Ordered   10/04/24 2107  Urine Culture  Once,   R       Question:  Indication  Answer:  Dysuria   10/04/24 2106   Unscheduled  Basic metabolic panel with GFR  Tomorrow morning,   R        10/05/24 1419   Unscheduled  CBC with Differential/Platelet  Tomorrow morning,   R        10/05/24 1419            Signed, Chapman Rota, MD [facility] 10/05/2024

## 2024-10-06 DIAGNOSIS — T796XXA Traumatic ischemia of muscle, initial encounter: Secondary | ICD-10-CM | POA: Diagnosis not present

## 2024-10-06 LAB — BASIC METABOLIC PANEL WITH GFR
Anion gap: 12 (ref 5–15)
BUN: 25 mg/dL — ABNORMAL HIGH (ref 8–23)
CO2: 22 mmol/L (ref 22–32)
Calcium: 8.1 mg/dL — ABNORMAL LOW (ref 8.9–10.3)
Chloride: 100 mmol/L (ref 98–111)
Creatinine, Ser: 1.32 mg/dL — ABNORMAL HIGH (ref 0.44–1.00)
GFR, Estimated: 42 mL/min — ABNORMAL LOW (ref >60)
Glucose, Bld: 164 mg/dL — ABNORMAL HIGH (ref 70–99)
Potassium: 3.3 mmol/L — ABNORMAL LOW (ref 3.5–5.1)
Sodium: 134 mmol/L — ABNORMAL LOW (ref 135–145)

## 2024-10-06 LAB — CBC WITH DIFFERENTIAL/PLATELET
Abs Immature Granulocytes: 0.05 K/uL (ref 0.00–0.07)
Basophils Absolute: 0.1 K/uL (ref 0.0–0.1)
Basophils Relative: 1 %
Eosinophils Absolute: 0.1 K/uL (ref 0.0–0.5)
Eosinophils Relative: 1 %
HCT: 36.9 % (ref 36.0–46.0)
Hemoglobin: 12.3 g/dL (ref 12.0–15.0)
Immature Granulocytes: 0 %
Lymphocytes Relative: 12 %
Lymphs Abs: 1.4 K/uL (ref 0.7–4.0)
MCH: 30.4 pg (ref 26.0–34.0)
MCHC: 33.3 g/dL (ref 30.0–36.0)
MCV: 91.3 fL (ref 80.0–100.0)
Monocytes Absolute: 0.8 K/uL (ref 0.1–1.0)
Monocytes Relative: 7 %
Neutro Abs: 9.4 K/uL — ABNORMAL HIGH (ref 1.7–7.7)
Neutrophils Relative %: 79 %
Platelets: 263 K/uL (ref 150–400)
RBC: 4.04 MIL/uL (ref 3.87–5.11)
RDW: 13 % (ref 11.5–15.5)
WBC: 11.9 K/uL — ABNORMAL HIGH (ref 4.0–10.5)
nRBC: 0 % (ref 0.0–0.2)

## 2024-10-06 LAB — GLUCOSE, CAPILLARY
Glucose-Capillary: 214 mg/dL — ABNORMAL HIGH (ref 70–99)
Glucose-Capillary: 124 mg/dL — ABNORMAL HIGH (ref 70–99)
Glucose-Capillary: 186 mg/dL — ABNORMAL HIGH (ref 70–99)
Glucose-Capillary: 211 mg/dL — ABNORMAL HIGH (ref 70–99)

## 2024-10-06 LAB — PROTIME-INR
INR: 2.5 — ABNORMAL HIGH (ref 0.8–1.2)
Prothrombin Time: 28.2 s — ABNORMAL HIGH (ref 11.4–15.2)

## 2024-10-06 LAB — CK: Total CK: 105 U/L (ref 38–234)

## 2024-10-06 MED ORDER — POTASSIUM CHLORIDE CRYS ER 20 MEQ PO TBCR
40.0000 meq | EXTENDED_RELEASE_TABLET | Freq: Once | ORAL | Status: AC
  Administered 2024-10-06: 40 meq via ORAL
  Filled 2024-10-06: qty 2

## 2024-10-06 MED ORDER — DULOXETINE HCL 60 MG PO CPEP
60.0000 mg | ORAL_CAPSULE | Freq: Every day | ORAL | Status: DC
  Administered 2024-10-06 – 2024-10-10 (×5): 60 mg via ORAL
  Filled 2024-10-06 (×5): qty 1

## 2024-10-06 MED ORDER — DILTIAZEM HCL 25 MG/5ML IV SOLN
15.0000 mg | Freq: Once | INTRAVENOUS | Status: AC
  Administered 2024-10-06: 15 mg via INTRAVENOUS
  Filled 2024-10-06: qty 5

## 2024-10-06 MED ORDER — PNEUMOCOCCAL 20-VAL CONJ VACC 0.5 ML IM SUSY: 0.5000 mL | PREFILLED_SYRINGE | INTRAMUSCULAR | Status: DC

## 2024-10-06 MED ORDER — PNEUMOCOCCAL 20-VAL CONJ VACC 0.5 ML IM SUSY
0.5000 mL | PREFILLED_SYRINGE | INTRAMUSCULAR | Status: DC
Start: 2024-10-07 — End: 2024-10-10
  Filled 2024-10-06: qty 0.5

## 2024-10-06 MED ORDER — WARFARIN SODIUM 2.5 MG PO TABS
2.5000 mg | ORAL_TABLET | Freq: Once | ORAL | Status: AC
  Administered 2024-10-06: 2.5 mg via ORAL
  Filled 2024-10-06: qty 1

## 2024-10-06 MED ORDER — FLECAINIDE ACETATE 50 MG PO TABS
100.0000 mg | ORAL_TABLET | Freq: Two times a day (BID) | ORAL | Status: DC
Start: 2024-10-06 — End: 2024-10-08
  Administered 2024-10-06 – 2024-10-07 (×3): 100 mg via ORAL
  Filled 2024-10-06: qty 2
  Filled 2024-10-06 (×3): qty 1
  Filled 2024-10-06: qty 2

## 2024-10-06 NOTE — Progress Notes (Signed)
 Patient is incontinent. States that Peggy Chen wears diapers at home. Has been found wet twice tonight and did not understand to call out. Peggy Chen is alert and oriented to self, and place but has to be reoriented to time. History of afib and HR has jumped up and down tonight. Difficult to get an accurate b/p as Peggy Chen has difficulty staying still.

## 2024-10-06 NOTE — Plan of Care (Signed)
  Problem: Coping: Goal: Ability to adjust to condition or change in health will improve Outcome: Progressing   Problem: Nutrition: Goal: Adequate nutrition will be maintained Outcome: Progressing   Problem: Skin Integrity: Goal: Risk for impaired skin integrity will decrease Outcome: Not Progressing   Problem: Education: Goal: Knowledge of General Education information will improve Description: Including pain rating scale, medication(s)/side effects and non-pharmacologic comfort measures Outcome: Not Progressing

## 2024-10-06 NOTE — Evaluation (Signed)
 Occupational Therapy Evaluation Patient Details Name: Peggy Chen MRN: [id number] DOB: 1951/11/14 Today's Date: 10/06/2024   History of Present Illness   73 y.o. female presents to [facility] 10/04/24 after falling with pt on the ground for 2 days, unable to get up. Pt with rhabdomyolysis, UTI, and AKI. PMHx: DM2, HTN, HLD, paroxysmal A-fib on Coumadin , sinus bradycardia, first-degree AV block, arthritis, impaired mobility, diverticulosis, anxiety, depression     Clinical Impressions Pt was walking with a rollator and endorses many falls. She was modified independent in self care and driving. She had paid assistance for housekeeping. Pt presents with impaired cognition, generalized weakness and LE pain. She requires moderate assistance for bed mobility and was unable to stand this visit due to pain, fatigue and weakness. Pt needs min to total assist for ADLs. Patient will benefit from continued inpatient follow up therapy, <3 hours/day.     If plan is discharge home, recommend the following:   Two people to help with walking and/or transfers;A lot of help with bathing/dressing/bathroom;Assistance with cooking/housework;Assistance with feeding;Direct supervision/assist for medications management;Direct supervision/assist for financial management;Assist for transportation;Help with stairs or ramp for entrance     Functional Status Assessment   Patient has had a recent decline in their functional status and demonstrates the ability to make significant improvements in function in a reasonable and predictable amount of time.     Equipment Recommendations   Other (comment) (defer to next level of care)     Recommendations for Other Services         Precautions/Restrictions   Precautions Precautions: Fall Recall of Precautions/Restrictions: Impaired Restrictions Weight Bearing Restrictions Per Provider Order: No     Mobility Bed Mobility Overal bed mobility: Needs  Assistance Bed Mobility: Rolling, Sidelying to Sit, Sit to Sidelying Rolling: Min assist Sidelying to sit: Mod assist     Sit to sidelying: Mod assist General bed mobility comments: assist to raise trunk and for LEs back into bed    Transfers                   General transfer comment: unable to stand due to B UE pain and weakness      Balance Overall balance assessment: Needs assistance   Sitting balance-Leahy Scale: Fair                                     ADL either performed or assessed with clinical judgement   ADL Overall ADL's : Needs assistance/impaired Eating/Feeding: Set up;Bed level   Grooming: Wash/dry hands;Wash/dry face;Oral care;Sitting;Minimal assistance Grooming Details (indicate cue type and reason): increased time, poor focus Upper Body Bathing: Moderate assistance;Sitting   Lower Body Bathing: Total assistance;Bed level   Upper Body Dressing : Moderate assistance;Sitting   Lower Body Dressing: Total assistance;Bed level       Toileting- Clothing Manipulation and Hygiene: Total assistance;Bed level               Vision Baseline Vision/History: 1 Wears glasses Ability to See in Adequate Light: 0 Adequate Patient Visual Report: No change from baseline Additional Comments: wears readers     Perception         Praxis         Pertinent Vitals/Pain Pain Assessment Pain Assessment: Faces Faces Pain Scale: Hurts little more Pain Location: B LEs Pain Descriptors / Indicators: Discomfort, Sore Pain Intervention(s): Monitored during session, Repositioned  Extremity/Trunk Assessment Upper Extremity Assessment Upper Extremity Assessment: Generalized weakness;RUE deficits/detail   Lower Extremity Assessment Lower Extremity Assessment: Defer to PT evaluation   Cervical / Trunk Assessment Cervical / Trunk Assessment: Other exceptions (chronic back pain)   Communication Communication Communication: No apparent  difficulties   Cognition Arousal: Alert Behavior During Therapy: WFL for tasks assessed/performed Cognition: Cognition impaired   Orientation impairments: Situation, Time Awareness: Intellectual awareness impaired, Online awareness impaired Memory impairment (select all impairments): Short-term memory, Working civil service fast streamer, Engineer, structural memory Attention impairment (select first level of impairment): Sustained attention Executive functioning impairment (select all impairments): Initiation, Sequencing, Reasoning, Problem solving OT - Cognition Comments: pt with soiled gown from breakfast and in urine, did not call for assistance, needed assist to use tv remote, tangential                 Following commands: Impaired Following commands impaired: Follows one step commands with increased time     Cueing  General Comments   Cueing Techniques: Verbal cues;Gestural cues      Exercises     Shoulder Instructions      Home Living Family/patient expects to be discharged to:: Private residence Living Arrangements: Alone Available Help at Discharge: Friend(s);Available PRN/intermittently;Neighbor Type of Home: House Home Access: Stairs to enter [employer] of Steps: 1 Entrance Stairs-Rails: None Home Layout: One level     Bathroom Shower/Tub: Producer, Television/film/video: Handicapped height     Home Equipment: Grab bars - tub/shower;Grab bars - toilet;Rollator (4 wheels);Cane - single point;Hand held shower head;Shower seat          Prior Functioning/Environment Prior Level of Function : Independent/Modified Independent;Driving;History of Falls (last six months)             Mobility Comments: ModI with rollator. Endorses many falls ADLs Comments: Largely Independent to Mod I but sometimes limited by back pain. Pt reports she has a paid housekeeper who comes once a month. Pt drives.    OT Problem List: Decreased strength;Impaired balance (sitting  and/or standing);Decreased activity tolerance;Pain;Decreased cognition;Decreased safety awareness   OT Treatment/Interventions: Self-care/ADL training;DME and/or AE instruction;Therapeutic activities;Patient/family education;Balance training;Cognitive remediation/compensation      OT Goals(Current goals can be found in the care plan section)   Acute Rehab OT Goals OT Goal Formulation: With patient Time For Goal Achievement: 10/20/24 Potential to Achieve Goals: Fair   OT Frequency:  Min 2X/week    Co-evaluation              AM-PAC OT 6 Clicks Daily Activity     Outcome Measure Help from another person eating meals?: A Little Help from another person taking care of personal grooming?: A Little Help from another person toileting, which includes using toliet, bedpan, or urinal?: Total Help from another person bathing (including washing, rinsing, drying)?: A Lot Help from another person to put on and taking off regular upper body clothing?: A Lot Help from another person to put on and taking off regular lower body clothing?: Total 6 Click Score: 12   End of Session    Activity Tolerance: Patient limited by pain;Patient limited by fatigue Patient left: in bed;with call bell/phone within reach;with bed alarm set  OT Visit Diagnosis: Muscle weakness (generalized) (M62.81);Other symptoms and signs involving cognitive function;Pain                Time: 8991-8950 OT Time Calculation (min): 41 min Charges:  OT General Charges $OT Visit: 1 Visit OT Evaluation $OT Eval Moderate  Complexity: 1 Mod OT Treatments $Self Care/Home Management : 23-37 mins Mliss HERO, OTR/L Acute Rehabilitation Services Office: [phone]  Kennth Mliss Helling 10/06/2024, 11:02 AM

## 2024-10-06 NOTE — TOC Initial Note (Signed)
 Transition of Care [facility]) - Initial/Assessment Note    Patient Details  Name: Peggy Chen MRN: [id number] Date of Birth: Jun 22, 1951  Transition [facility]) CM/SW Contact:    Jeoffrey LITTIE Moose, [place] Phone Number: 10/06/2024, 2:58 PM  Clinical Narrative:                 Pt admitted from home due to fall. CSW completed SNF workup with pt at bedside. Pt is agreeable to SNF and had a friend visiting with her during SNF workup. Pt stated she would like for her friend to help her with the SNF decision. Pt friend name is Chyrl Shade [phone]). CSW completed Fl2 and sent out SNF referrals, CSW will continue to follow.  Expected Discharge Plan: Skilled Nursing Facility Barriers to Discharge: Continued Medical Work up, English As A Second Language Teacher, SNF Pending bed offer   Patient Goals and CMS Choice Patient states their goals for this hospitalization and ongoing recovery are:: SNF          Expected Discharge Plan and Services       Living arrangements for the past 2 months: Skilled Nursing Facility                                      Prior Living Arrangements/Services Living arrangements for the past 2 months: Skilled Nursing Facility Lives with:: Self Patient language and need for interpreter reviewed:: Yes Do you feel safe going back to the place where you live?: Yes      Need for Family Participation in Patient Care: No (Comment)     Criminal Activity/Legal Involvement Pertinent to Current Situation/Hospitalization: No - Comment as needed  Activities of Daily Living   ADL Screening (condition at time of admission) Independently performs ADLs?: Yes (appropriate for developmental age) Is the patient deaf or have difficulty hearing?: No Does the patient have difficulty seeing, even when wearing glasses/contacts?: No Does the patient have difficulty concentrating, remembering, or making decisions?: No  Permission Sought/Granted Permission sought to share  information with : Magazine Features Editor, Other (comment)    Share Information with NAME: Chyrl Shade  Permission granted to share info w AGENCY: SNFs  Permission granted to share info w Relationship: Friend  Permission granted to share info w Contact Information: [phone]  Emotional Assessment Appearance:: Appears stated age Attitude/Demeanor/Rapport: Engaged Affect (typically observed): Pleasant Orientation: : Oriented to Self, Oriented to Place, Oriented to  Time, Oriented to Situation Alcohol / Substance Use: Not Applicable Psych Involvement: No (comment)  Admission diagnosis:  Rhabdomyolysis [M62.82] Failure to thrive in adult [R62.7] Acute cystitis without hematuria [N30.00] Atrial fibrillation with RVR (HCC) [I48.91] Fall, initial encounter [W19.XXXA] Patient Active Problem List   Diagnosis Date Noted   Rhabdomyolysis 10/04/2024   Acute kidney injury superimposed on chronic kidney disease 10/30/2023   Supratherapeutic INR 10/30/2023   Normocytic anemia 10/30/2023   Leukocytosis 10/30/2023   Fall at home, initial encounter 10/30/2023   Hypokalemia 11/25/2017   Falls 11/25/2017   Type 2 diabetes mellitus (HCC) 11/25/2017   Chronic anticoagulation 11/25/2017   Sepsis secondary to UTI (HCC) 11/24/2017   Hyperlipidemia 06/17/2015   Hypertension associated with diabetes (HCC) 09/28/2011   Paroxysmal atrial fibrillation (HCC) 06/29/2011   PCP:  Avva, Ravisankar, MD Pharmacy:   [id number] [place], [place] - [address] AT [facility] [address] [place] [address] Phone: [phone] Fax: [phone]  [facility] [address], [place] - [address] [place] [address] Phone: [phone] Fax: [phone]  [id number] [place], [place] - [address] AT [facility] OF [facility] & [address] [place] [address] Phone: [phone] Fax:  [phone]  [facility] [address] [place] [address] Phone: [phone] Fax: [phone]     Social Drivers of Health (SDOH) Social History: SDOH Screenings   Food Insecurity: No Food Insecurity (10/05/2024)  Housing: Low Risk  (10/05/2024)  Transportation Needs: No Transportation Needs (10/05/2024)  Utilities: Not At Risk (10/05/2024)  Social Connections: Moderately Integrated (10/05/2024)  Tobacco Use: Medium Risk (10/04/2024)   SDOH Interventions:     Readmission Risk Interventions     No data to display

## 2024-10-06 NOTE — Progress Notes (Signed)
     Patient Name: Peggy Chen           DOB: 10/07/1951  MRN: [id number]      Admission Date: 10/04/2024  Attending Provider: Arlice Reichert, MD  Primary Diagnosis: Rhabdomyolysis   Level of care: Telemetry   OVERNIGHT EVENT   A-fib with RVR -- HR 130-140s.   paroxysmal A-fib PTA meds-Flecainide  75 mg twice daily  No discomfort or acute changes reported by nursing staff. Prior vitals stable. Patient was previously on Cardizem gtt for rate control.  I have added order for Cardizem push.   Shamiracle Gorden, DNP, ACNPC- AG [facility] [place]

## 2024-10-06 NOTE — Progress Notes (Signed)
 Patient was given cardizem push 15mg  per order  HR now 102 BP 138/69

## 2024-10-06 NOTE — Progress Notes (Signed)
 Pt had high HR of 150's to 160's, pt remains asymptomatic. Dr Arlice was notified and ordered to increase the dose of Tambocor .

## 2024-10-06 NOTE — Progress Notes (Signed)
 PHARMACY - ANTICOAGULATION CONSULT NOTE  Pharmacy Consult for warfarin Indication: atrial fibrillation  Allergies  Allergen Reactions   Penicillins Hives        Patient Measurements: Height: 5' 4 (162.6 cm) Weight: 74.4 kg (164 lb) IBW/kg (Calculated) : 54.7 HEPARIN DW (KG): 70.2  Vital Signs: Temp: 98.2 F (36.8 C) (10/31 0703) Temp Source: Oral (10/31 0703) BP: 138/69 (10/31 0703) Pulse Rate: 102 (10/31 0703)  Labs: Recent Labs    10/04/24 1436 10/05/24 0549 10/05/24 1040 10/06/24 0405 10/06/24 0815  HGB 15.4*  --  12.9 12.3  --   HCT 47.3*  --  39.6 36.9  --   PLT 310  --  292 263  --   APTT 45*  --   --   --   --   LABPROT 26.4* 27.5*  --   --  28.2*  INR 2.3* 2.4*  --   --  2.5*  CREATININE 1.69* 1.43*  --  1.32*  --   CKTOTAL 410* 274*  --  105  --     Estimated Creatinine Clearance: 37 mL/min (A) (by C-G formula based on SCr of 1.32 mg/dL (H)).   Medical History: Past Medical History:  Diagnosis Date   Anxiety    Arthritis    Asthma    Bronchitis   Atherosclerosis 11/25/2017   noted on CT   Back pain    Depression    Diabetes mellitus    Diverticulosis 11/25/2017   mild, noted on CT   Fatty liver 11/25/2017   noted on CT   First degree AV block    History of kidney stones    Hydronephrosis 11/25/2017   mild left noted on CT   Hyperlipidemia    Hypertension    PAF (paroxysmal atrial fibrillation) (HCC)    Renal cyst 11/2017   Left, noted on US    Right renal artery stenosis    Sinus bradycardia    Skin cancer    questionable skin cancer on face years ago   UTI (urinary tract infection)    Medications:  Scheduled:   aspirin  EC  81 mg Oral Daily   flecainide   75 mg Oral Q12H   Influenza vac split trivalent PF  0.5 mL Intramuscular Tomorrow-1000   insulin  aspart  0-5 Units Subcutaneous QHS   insulin  aspart  0-9 Units Subcutaneous TID WC   Warfarin - Pharmacist Dosing Inpatient   Does not apply q1600   zolpidem   5 mg Oral QHS    Infusions:   cefTRIAXone  (ROCEPHIN )  IV 1 g (10/05/24 2200)   diltiazem (CARDIZEM) infusion 10 mg/hr (10/04/24 2202)   sodium chloride  Stopped (10/04/24 1521)   Assessment: Patient is a 46 YOF presenting to the ED in setting of a fall on 10/27 and unable to get up since. Patient with history of atrial fibrillation anticoagulated PTA with warfarin. Pharmacy has been consulted to dose warfarin for afib.   Initial INR 2.3. Patient's home regimen is warfarin 5mg  tablets; take 1 tablet Mondays and Wednesdays and take 1/2 tablet all other days as confirmed by patient. Patient reports has not taken a dose of warfarin yet today. Hgb and plt WNL.   Of note, patient reports she is taking warfarin instead of a DOAC for afib solely due to cost/insurance reasons, but states her insurance has recently changed and should now cover Eliquis. She would like to transition to Eliquis if insurance would cover it.   Goal of Therapy:  INR 2-3 Monitor  platelets by anticoagulation protocol: Yes   Plan:  -Give warfarin 2.5 mg po x1 tonight.  -Monitor CBC, INR daily.  Eliquis copay is $171.12- continue warfarin   Kimani Hovis BS, PharmD, BCPS Clinical Pharmacist 10/06/2024 7:40 AM  Contact: [phone] after 3 PM

## 2024-10-06 NOTE — Progress Notes (Signed)
 PROGRESS NOTE  Peggy Chen  DOB: 1951-01-20  PCP: Avva, Ravisankar, MD FMW:[id number]  DOA: 10/04/2024  LOS: 0 days  Hospital Day: 3  Subjective: Patient was seen and examined this morning. Propped up in bed.  Not in distress. Afebrile, blood pressure in 130s Labs from this morning with WBC count 11.9, sodium 134, potassium 3.3, BUN/creatinine 25/1.32 Event from last night noted.  Was off Cardizem drip in the afternoon.  Later in the night, she had RVR up to 130s and 140s.  1 dose of Cardizem 50 mg IV push was given with improvement in heart rate to 102 This morning heart rate ranging in 90s to low 100s.    Pleasant elderly Caucasian female.  Not in distress. Chart reviewed It seems Cardizem drip was stopped yesterday at 10 PM Afebrile, heart rate in 80s, blood pressure in normal range, breathing on room air Labs from this morning with potassium 3.3, creatinine better at 1.43, CK better at 274, INR 2.4  Brief narrative: Peggy Chen is a 73 y.o. female with PMH significant for DM2, HTN, HLD, paroxysmal A-fib on Coumadin , sinus bradycardia, first-degree AV block, arthritis, impaired mobility, diverticulosis, anxiety, depression Lives at home.  Has difficulty with ambulation.  At baseline, uses a walker.  Patient actually had a fall on the evening of Monday 10/27. It sounds like her walker hit something while she was going to the bathroom.  She fell to the ground, did not hit her head, did not pass out, was too weak to get up on her own.  She spent most of the time crawling around the floor.  Not having heard from her for 2 days, a friend of her called welfare check.  EMS found on the floor, complaining of left hip pain and brought to the ED.  In the ED, patient was afebrile, Mildly tachycardic, blood pressure in 140s, breathing on room air EKG with A-fib with RVR at 140 bpm, QTc 562 ms Initial labs with WC count 17.2, hemoglobin 15.4, INR 2.3, potassium 3.3, BUN/creatinine  38/1.69, CK 410 Chest x-ray unremarkable for acute findings X-ray pelvis unremarkable CT head did not show any acute intracranial abnormality, showed generalized cerebral atrophy and chronic microvascular ischemic changes CT cervical spine did not show any acute fracture, showed multilevel degenerative changes most prominent at level C4-C5 and C6-C7 Urinalysis showed cloudy yellow urine with moderate hemoglobin, moderate leukocytes, positive nitrite and many bacteria  In the ED, she was started on IV Rocephin , IV fluid. For A-fib with RVR, she was given IV diltiazem bolus and started on drip. Hospitalist service was consulted for further workup and management.  Assessment/Plan: UTI Likely cause of weakness and fall  Urinalysis showed cloudy yellow urine with moderate hemoglobin, moderate leukocytes, positive nitrite and many bacteria Urine culture sent Currently on IV Rocephin  WBC count improving Recent Labs  Lab 10/04/24 1436 10/05/24 1040 10/06/24 0405  WBC 17.2* 16.5* 11.9*   AKI on CKD 3B Rhabdomyolysis Was on the floor for 2 days, unable to get up Looked dehydrated on presentation. Initial CK was elevated to over 400.   Baseline creatinine less than 1.4.  Presented with creatinine elevated to 1.69 With IV hydration, CK and creatinine are improving. Stop IV fluid today Recent Labs  Lab 10/04/24 1436 10/05/24 0549 10/06/24 0405  CKTOTAL 410* 274* 105   Recent Labs    10/30/23 1913 10/30/23 1941 10/31/23 0247 11/01/23 0233 11/02/23 0255 12/15/23 2030 04/24/24 1900 10/04/24 1436 10/05/24 0549 10/06/24 0405  BUN 44* 43* 42* 36* 29* 21 33* 38* 37* 25*  CREATININE 2.10* 2.10* 2.04* 1.54* 1.52* 1.44* 1.36* 1.69* 1.43* 1.32*  CO2 27  --  24 24 25 25 22  21* 22 22   A-fib with RVR  paroxysmal A-fib H/o first-degree AV block PTA meds-Flecainide  75 mg twice daily but missed it for 2 days at least after the fall. In the ED, on RVR up to 120s.  Started on on Cardizem  drip.  Flecainide  was resumed. Cardizem was eventually stopped within 24 hours. Noted patient required 1 dose of Cardizem push last night again. This morning, her heart rate is ranging in 90s and low 100s, in A-fib Chronically anticoagulated with Coumadin .  Patient states she was on Eliquis in the past but had to switch to Coumadin  few years ago because of the cost.  Her Eliquis co-pay is still high at $171.  Hence patient is not yet willing to switch to Eliquis. INR therapeutic between 2 and 3.  Pharmacy consulted Recent Labs  Lab 10/04/24 1436 10/05/24 0549 10/06/24 0815  INR 2.3* 2.4* 2.5*    Prolonged Qtc Initial EKG with QTc prolonged at 462 ms Monitor electrolytes Repeat EKG 10/30 showed QTc improved to 439 ms  Hypokalemia/hypomagnesemia Potassium level was low again at 3.3 this morning.  Replacement ordered Recent Labs  Lab 10/04/24 1436 10/05/24 0549 10/06/24 0405  K 3.3* 3.3* 3.3*  MG  --  1.4*  --   PHOS  --  3.1  --    Type 2 diabetes mellitus A1c 6.1 on 09/25/2024  PTA meds-none. Currently on SSI/Accu-Cheks.  Blood sugar level running elevated this morning.  Continue to monitor.  If remains elevated, will start metformin or Amaryl Recent Labs  Lab 10/05/24 1641 10/05/24 2144 10/05/24 2207 10/06/24 0922 10/06/24 1218  GLUCAP 145* 178* 174* 214* 124*   HTN PTA meds- amlodipine  5 mg daily Keep on hold while on Cardizem drip  HLD Continue Whelchol  Diverticulosis Chronic anemia H&H in normal range. Recent Labs    12/15/23 2030 04/24/24 1900 10/04/24 1436 10/05/24 1040 10/06/24 0405  HGB 13.7 13.3 15.4* 12.9 12.3  MCV 88.9 92.0 91.1 91.5 91.3   Anxiety/depression Resume Cymbalta .  Continue nightly Ambien   Chronic arthritic pain Continue Norco PRN as before  Fall Impaired mobility It seems the fall was mechanical in nature.  Likely confounded by weakness from UTI. Imagings negative for fracture PT eval obtained.  SNF  recommended  Mobility:  PT Orders:   PT Follow up Rec: Skilled Nursing-Short Term Rehab (<3 Hours/Day)10/05/2024 1452   Goals of care:   Code Status: Full Code.  Confirmed with patient   DVT prophylaxis:   warfarin (COUMADIN ) tablet 2.5 mg   Antimicrobials: None Fluid: Stop IV fluid Consultants: None Family Communication: None at bedside  Status: Observation Level of care:  Telemetry   Patient is from: Home Anticipated d/c to: Pending clinical course, may need SNF  Diet:  Diet Order             Diet regular Room service appropriate? Yes; Fluid consistency: Thin  Diet effective now                    Scheduled Meds:  aspirin  EC  81 mg Oral Daily   DULoxetine   60 mg Oral Daily   flecainide   75 mg Oral Q12H   Influenza vac split trivalent PF  0.5 mL Intramuscular Tomorrow-1000   insulin  aspart  0-5 Units Subcutaneous QHS  insulin  aspart  0-9 Units Subcutaneous TID WC   [START ON 10/07/2024] pneumococcal 20-valent conjugate vaccine  0.5 mL Intramuscular Tomorrow-1000   warfarin  2.5 mg Oral ONCE-1600   Warfarin - Pharmacist Dosing Inpatient   Does not apply q1600   zolpidem   5 mg Oral QHS    PRN meds: acetaminophen  **OR** acetaminophen , albuterol , bisacodyl, colesevelam, hydrALAZINE, HYDROcodone -acetaminophen , HYDROmorphone (DILAUDID) injection, polyethylene glycol   Infusions:   cefTRIAXone  (ROCEPHIN )  IV 1 g (10/05/24 2200)    Antimicrobials: Anti-infectives (From admission, onward)    Start     Dose/Rate Route Frequency Ordered Stop   10/05/24 2200  cefTRIAXone  (ROCEPHIN ) 1 g in sodium chloride  0.9 % 100 mL IVPB        1 g 200 mL/hr over 30 Minutes Intravenous Every 24 hours 10/05/24 1513     10/04/24 2115  cefTRIAXone  (ROCEPHIN ) 2 g in sodium chloride  0.9 % 100 mL IVPB  Status:  Discontinued        2 g 200 mL/hr over 30 Minutes Intravenous  Once 10/04/24 2106 10/04/24 2108   10/04/24 2115  cefTRIAXone  (ROCEPHIN ) 1 g in sodium chloride  0.9 % 100 mL  IVPB        1 g 200 mL/hr over 30 Minutes Intravenous  Once 10/04/24 2108 10/04/24 2359       Objective: Vitals:   10/06/24 0703 10/06/24 1016  BP: 138/69 118/69  Pulse: (!) 102 (!) 103  Resp: 18 17  Temp: 98.2 F (36.8 C) (!) 97.1 F (36.2 C)  SpO2:  95%    Intake/Output Summary (Last 24 hours) at 10/06/2024 1315 Last data filed at 10/06/2024 1028 Gross per 24 hour  Intake 792.73 ml  Output --  Net 792.73 ml   Filed Weights   10/04/24 1429  Weight: 74.4 kg   Weight change:  Body mass index is 28.15 kg/m.   Physical Exam: General exam: Pleasant, elderly Caucasian female.  Not in distress. Skin: No rashes, lesions or ulcers. HEENT: Atraumatic, normocephalic, no obvious bleeding Lungs: Clear to auscultation bilaterally CVS: Rate controlled irregular rhythm, no murmur GI/Abd: Soft, nontender, nondistended, bowel sound present,   CNS: Alert, awake, oriented x 3. Psychiatry: Mood appropriate. Extremities: No pedal edema, no calf tenderness,   Data Review: I have personally reviewed the laboratory data and studies available.  F/u labs ordered Unresulted Labs (From admission, onward)     Start     Ordered   10/07/24 0500  Protime-INR  Daily,   R      10/06/24 0741            Signed, Chapman Rota, MD [facility] 10/06/2024

## 2024-10-06 NOTE — NC FL2 (Signed)
 [place]  MEDICAID FL2 LEVEL OF CARE FORM     IDENTIFICATION  Patient Name: Peggy Chen Birthdate: 11/08/1951 Sex: female Admission Date (Current Location): 10/04/2024  [facility] and [place] Number:  Producer, Television/film/video and Address:  [facility], [address] [place], [place] [address]      Provider Number: [id number]  Attending Physician Name and Address:  Arlice Reichert, MD  Relative Name and Phone Number:       Current Level of Care: Hospital Recommended Level of Care: Skilled Nursing Facility Prior Approval Number:    Date Approved/Denied:   PASRR Number: [id number]  Discharge Plan: SNF    Current Diagnoses: Patient Active Problem List   Diagnosis Date Noted   Rhabdomyolysis 10/04/2024   Acute kidney injury superimposed on chronic kidney disease 10/30/2023   Supratherapeutic INR 10/30/2023   Normocytic anemia 10/30/2023   Leukocytosis 10/30/2023   Fall at home, initial encounter 10/30/2023   Hypokalemia 11/25/2017   Falls 11/25/2017   Type 2 diabetes mellitus (HCC) 11/25/2017   Chronic anticoagulation 11/25/2017   Sepsis secondary to UTI (HCC) 11/24/2017   Hyperlipidemia 06/17/2015   Hypertension associated with diabetes (HCC) 09/28/2011   Paroxysmal atrial fibrillation (HCC) 06/29/2011    Orientation RESPIRATION BLADDER Height & Weight     Situation, Self, Time, Place  Normal Continent Weight: 164 lb (74.4 kg) Height:  5' 4 (162.6 cm)  BEHAVIORAL SYMPTOMS/MOOD NEUROLOGICAL BOWEL NUTRITION STATUS      Continent Diet (See DC Summary)  AMBULATORY STATUS COMMUNICATION OF NEEDS Skin   Extensive Assist Verbally Normal                       Personal Care Assistance Level of Assistance  Bathing, Feeding, Dressing Bathing Assistance: Maximum assistance Feeding assistance: Independent Dressing Assistance: Maximum assistance     Functional Limitations Info  Sight, Hearing, Speech Sight Info: Adequate Hearing Info:  Adequate Speech Info: Adequate    SPECIAL CARE FACTORS FREQUENCY  PT (By licensed PT), OT (By licensed OT)     PT Frequency: 5x/week OT Frequency: 5x/week            Contractures Contractures Info: Not present    Additional Factors Info  Code Status, Allergies Code Status Info: Full Allergies Info: Penicillins           Current Medications (10/06/2024):  This is the current hospital active medication list Current Facility-Administered Medications  Medication Dose Route Frequency Provider Last Rate Last Admin   acetaminophen  (TYLENOL ) tablet 650 mg  650 mg Oral Q6H PRN Dahal, Reichert, MD       Or   acetaminophen  (TYLENOL ) suppository 650 mg  650 mg Rectal Q6H PRN Dahal, Binaya, MD       albuterol  (PROVENTIL ) (2.5 MG/3ML) 0.083% nebulizer solution 2.5 mg  2.5 mg Nebulization Q6H PRN Dahal, Reichert, MD       aspirin  EC tablet 81 mg  81 mg Oral Daily Dahal, Reichert, MD   81 mg at 10/06/24 0915   bisacodyl (DULCOLAX) EC tablet 5 mg  5 mg Oral Daily PRN Dahal, Reichert, MD       cefTRIAXone  (ROCEPHIN ) 1 g in sodium chloride  0.9 % 100 mL IVPB  1 g Intravenous Q24H Dahal, Binaya, MD 200 mL/hr at 10/05/24 2200 1 g at 10/05/24 2200   colesevelam [facility]) tablet 625-1,875 mg  625-1,875 mg Oral PRN Dahal, Reichert, MD       DULoxetine  (CYMBALTA ) DR capsule 60 mg  60 mg Oral Daily Dahal, Binaya, MD   60 mg at 10/06/24 1222   flecainide  (TAMBOCOR ) tablet 75 mg  75 mg Oral Q12H Dahal, Binaya, MD   75 mg at 10/06/24 0915   hydrALAZINE (APRESOLINE) injection 10 mg  10 mg Intravenous Q6H PRN Dahal, Chapman, MD       HYDROcodone -acetaminophen  (NORCO) 10-325 MG per tablet 1 tablet  1 tablet Oral Q6H PRN Dahal, Binaya, MD   1 tablet at 10/06/24 0620   HYDROmorphone (DILAUDID) injection 0.5 mg  0.5 mg Intravenous Q4H PRN Dahal, Chapman, MD   0.5 mg at 10/05/24 0541   Influenza vac split trivalent PF (FLUZONE HIGH-DOSE) injection 0.5 mL  0.5 mL Intramuscular Tomorrow-1000 Dahal, Chapman, MD       insulin   aspart (novoLOG ) injection 0-5 Units  0-5 Units Subcutaneous QHS Dahal, Binaya, MD       insulin  aspart (novoLOG ) injection 0-9 Units  0-9 Units Subcutaneous TID WC Dahal, Binaya, MD   1 Units at 10/06/24 1222   [START ON 10/07/2024] pneumococcal 20-valent conjugate vaccine (PREVNAR 20) injection 0.5 mL  0.5 mL Intramuscular Tomorrow-1000 Dahal, Binaya, MD       polyethylene glycol (MIRALAX / GLYCOLAX) packet 17 g  17 g Oral Daily PRN Dahal, Binaya, MD       warfarin (COUMADIN ) tablet 2.5 mg  2.5 mg Oral ONCE-1600 Reome, Earle J, RPH       Warfarin - Pharmacist Dosing Inpatient   Does not apply [id number] Dahal, Chapman, MD       zolpidem  (AMBIEN ) tablet 5 mg  5 mg Oral QHS Dahal, Binaya, MD   5 mg at 10/05/24 2152     Discharge Medications: Please see discharge summary for a list of discharge medications.  Relevant Imaging Results:  Relevant Lab Results:   Additional Information SSN: [id number]  Jeoffrey LITTIE Moose, LCSWA

## 2024-10-07 ENCOUNTER — Observation Stay (HOSPITAL_COMMUNITY)

## 2024-10-07 DIAGNOSIS — T796XXA Traumatic ischemia of muscle, initial encounter: Secondary | ICD-10-CM | POA: Diagnosis not present

## 2024-10-07 LAB — BASIC METABOLIC PANEL WITH GFR
Anion gap: 11 (ref 5–15)
BUN: 24 mg/dL — ABNORMAL HIGH (ref 8–23)
CO2: 24 mmol/L (ref 22–32)
Calcium: 8.3 mg/dL — ABNORMAL LOW (ref 8.9–10.3)
Chloride: 100 mmol/L (ref 98–111)
Creatinine, Ser: 1.37 mg/dL — ABNORMAL HIGH (ref 0.44–1.00)
GFR, Estimated: 41 mL/min — ABNORMAL LOW (ref >60)
Glucose, Bld: 164 mg/dL — ABNORMAL HIGH (ref 70–99)
Potassium: 3.6 mmol/L (ref 3.5–5.1)
Sodium: 135 mmol/L (ref 135–145)

## 2024-10-07 LAB — URINE CULTURE: Culture: >=100000 — AB

## 2024-10-07 LAB — PROTIME-INR
INR: 2.3 — ABNORMAL HIGH (ref 0.8–1.2)
Prothrombin Time: 26.4 s — ABNORMAL HIGH (ref 11.4–15.2)

## 2024-10-07 LAB — GLUCOSE, CAPILLARY
Glucose-Capillary: 164 mg/dL — ABNORMAL HIGH (ref 70–99)
Glucose-Capillary: 277 mg/dL — ABNORMAL HIGH (ref 70–99)
Glucose-Capillary: 162 mg/dL — ABNORMAL HIGH (ref 70–99)
Glucose-Capillary: 182 mg/dL — ABNORMAL HIGH (ref 70–99)
Glucose-Capillary: 201 mg/dL — ABNORMAL HIGH (ref 70–99)

## 2024-10-07 MED ORDER — WARFARIN SODIUM 2.5 MG PO TABS
2.5000 mg | ORAL_TABLET | Freq: Once | ORAL | Status: AC
  Administered 2024-10-07: 2.5 mg via ORAL
  Filled 2024-10-07: qty 1

## 2024-10-07 MED ORDER — COLCHICINE 0.6 MG PO TABS
0.6000 mg | ORAL_TABLET | Freq: Once | ORAL | Status: AC
  Administered 2024-10-07: 0.6 mg via ORAL
  Filled 2024-10-07: qty 1

## 2024-10-07 MED ORDER — COLCHICINE 0.6 MG PO TABS
1.2000 mg | ORAL_TABLET | Freq: Once | ORAL | Status: AC
  Administered 2024-10-07: 1.2 mg via ORAL
  Filled 2024-10-07: qty 2

## 2024-10-07 MED ORDER — LOSARTAN POTASSIUM 25 MG PO TABS
25.0000 mg | ORAL_TABLET | Freq: Every day | ORAL | Status: DC
  Administered 2024-10-07: 25 mg via ORAL
  Filled 2024-10-07 (×2): qty 1

## 2024-10-07 MED ORDER — GLIPIZIDE 5 MG PO TABS
2.5000 mg | ORAL_TABLET | Freq: Every day | ORAL | Status: DC
  Administered 2024-10-08 – 2024-10-10 (×3): 2.5 mg via ORAL
  Filled 2024-10-07 (×4): qty 0.5

## 2024-10-07 MED ORDER — DILTIAZEM HCL-DEXTROSE 125-5 MG/125ML-% IV SOLN (PREMIX)
5.0000 mg/h | INTRAVENOUS | Status: DC
  Administered 2024-10-07: 5 mg/h via INTRAVENOUS
  Administered 2024-10-08: 10 mg/h via INTRAVENOUS
  Filled 2024-10-07 (×3): qty 125

## 2024-10-07 MED ORDER — ALLOPURINOL 100 MG PO TABS
50.0000 mg | ORAL_TABLET | Freq: Every day | ORAL | Status: DC
  Administered 2024-10-07 – 2024-10-10 (×4): 50 mg via ORAL
  Filled 2024-10-07 (×4): qty 1

## 2024-10-07 MED ORDER — COLCHICINE 0.6 MG PO TABS: 1.2000 mg | ORAL_TABLET | Freq: Once | ORAL | Status: DC

## 2024-10-07 NOTE — Progress Notes (Signed)
 Called pts emergency contact Rob Bair @ [phone] to notify of pts transfer to [id number].

## 2024-10-07 NOTE — Progress Notes (Signed)
 PROGRESS NOTE  Peggy Chen  DOB: [birth year]  PCP: Avva, Ravisankar, MD FMW:[id number]  DOA: 10/04/2024  LOS: 0 days  Hospital Day: 4  Subjective: Patient was seen and examined this morning. Lying down in bed.  Not in distress.  Brother at bedside. Heart rate in low 100s, blood pressure most less than 140s Labs this morning with creatinine at 1.37, INR 2.3  Brief narrative: Peggy Chen is a 73 y.o. female with PMH significant for DM2, HTN, HLD, paroxysmal A-fib on Coumadin , sinus bradycardia, first-degree AV block, arthritis, impaired mobility, diverticulosis, anxiety, depression Lives at home.  Has difficulty with ambulation.  At baseline, uses a walker.  Patient actually had a fall on the evening of Monday 10/27. It sounds like her walker hit something while she was going to the bathroom.  She fell to the ground, did not hit her head, did not pass out, was too weak to get up on her own.  She spent most of the time crawling around the floor.  Not having heard from her for 2 days, a friend of her called welfare check.  EMS found on the floor, complaining of left hip pain and brought to the ED.  In the ED, patient was afebrile, Mildly tachycardic, blood pressure in 140s, breathing on room air EKG with A-fib with RVR at 140 bpm, QTc 562 ms Initial labs with WC count 17.2, hemoglobin 15.4, INR 2.3, potassium 3.3, BUN/creatinine 38/1.69, CK 410 Chest x-ray unremarkable for acute findings X-ray pelvis unremarkable CT head did not show any acute intracranial abnormality, showed generalized cerebral atrophy and chronic microvascular ischemic changes CT cervical spine did not show any acute fracture, showed multilevel degenerative changes most prominent at level C4-C5 and C6-C7 Urinalysis showed cloudy yellow urine with moderate hemoglobin, moderate leukocytes, positive nitrite and many bacteria  In the ED, she was started on IV Rocephin , IV fluid. For A-fib with RVR, she was given  IV diltiazem bolus and started on drip. Hospitalist service was consulted for further workup and management.  Assessment/Plan: Klebsiella pneumoniae UTI Likely cause of weakness and fall  Urinalysis showed cloudy yellow urine with moderate hemoglobin, moderate leukocytes, positive nitrite and many bacteria Urine culture grew more than 100,000 CFU per mL of pansensitive Klebsiella pneumoniae Currently improving on IV Rocephin  WBC count improving Recent Labs  Lab 10/04/24 1436 10/05/24 1040 10/06/24 0405  WBC 17.2* 16.5* 11.9*   AKI on CKD 3B Rhabdomyolysis Was on the floor for 2 days, unable to get up Looked dehydrated on presentation. Initial CK was elevated to over 400.   Baseline creatinine less than 1.4.  Presented with creatinine elevated to 1.69 With IV hydration, CK and creatinine improved. Recent Labs  Lab 10/04/24 1436 10/05/24 0549 10/06/24 0405  CKTOTAL 410* 274* 105   Recent Labs    10/30/23 1913 10/30/23 1941 10/31/23 0247 11/01/23 0233 11/02/23 0255 12/15/23 2030 04/24/24 1900 10/04/24 1436 10/05/24 0549 10/06/24 0405 10/07/24 0534  BUN 44* 43* 42* 36* 29* 21 33* 38* 37* 25* 24*  CREATININE 2.10* 2.10* 2.04* 1.54* 1.52* 1.44* 1.36* 1.69* 1.43* 1.32* 1.37*  CO2 27  --  24 24 25 25 22  21* 22 22 24    A-fib with RVR  paroxysmal A-fib H/o first-degree AV block PTA meds-Flecainide  75 mg twice daily but missed it for 2 days at least after the fall. This hospitalization, patient has been consistently in A-fib and intermittently in RVR.   Has been off and on Cardizem drip. 10/31, increased dose  of flecainide  from 75 mg twice daily to 100 mg twice daily. Remains in A-fib but heart rate in low 100s.  Continue to monitor. Regarding anticoagulation, patient has been chronically anticoagulated with Coumadin .  Patient states she was on Eliquis in the past but had to switch to Coumadin  few years ago because of the cost.  Her Eliquis co-pay is still high at $171.   Hence patient is not yet willing to switch to Eliquis. INR therapeutic between 2 and 3.  Pharmacy consulted Recent Labs  Lab 10/04/24 1436 10/05/24 0549 10/06/24 0815 10/07/24 0534  INR 2.3* 2.4* 2.5* 2.3*    Prolonged Qtc Initial EKG with QTc prolonged at 462 ms Monitor electrolytes Repeat EKG 10/30 showed QTc improved to 439 ms  Hypokalemia/hypomagnesemia Potassium level improved with replacement. Recent Labs  Lab 10/04/24 1436 10/05/24 0549 10/06/24 0405 10/07/24 0534  K 3.3* 3.3* 3.3* 3.6  MG  --  1.4*  --   --   PHOS  --  3.1  --   --    Type 2 diabetes mellitus A1c 6.1 on 09/25/2024  PTA meds-none. Currently on SSI/Accu-Cheks.  Blood sugar level runs elevated.  I will add glipizide 2.5 mg daily. Recent Labs  Lab 10/06/24 1218 10/06/24 1714 10/06/24 1959 10/07/24 0810 10/07/24 1203  GLUCAP 124* 186* 211* 164* 277*   HTN Not on antihypertensives.  Blood pressure in 160s this morning. Add losartan 25 mg daily  HLD Continue Whelchol  Diverticulosis Chronic anemia H&H in normal range. Recent Labs    12/15/23 2030 04/24/24 1900 10/04/24 1436 10/05/24 1040 10/06/24 0405  HGB 13.7 13.3 15.4* 12.9 12.3  MCV 88.9 92.0 91.1 91.5 91.3   Anxiety/depression Continue Cymbalta , nightly Ambien   Chronic arthritic pain Continue Norco PRN as before  Fall Impaired mobility It seems the fall was mechanical in nature.  Likely confounded by weakness from UTI. Imagings negative for fracture PT eval obtained.  SNF recommended  Mobility:  PT Orders:   PT Follow up Rec: Skilled Nursing-Short Term Rehab (<3 Hours/Day)10/05/2024 1452   Goals of care:   Code Status: Full Code.  Confirmed with patient   DVT prophylaxis:   warfarin (COUMADIN ) tablet 2.5 mg   Antimicrobials: None Fluid: Stop IV fluid Consultants: None Family Communication: Brother at bedside  Status: Observation Level of care:  Telemetry   Patient is from: Home Anticipated d/c to:  Pending clinical course, may need SNF  Diet:  Diet Order             Diet regular Room service appropriate? Yes; Fluid consistency: Thin  Diet effective now                    Scheduled Meds:  aspirin  EC  81 mg Oral Daily   DULoxetine   60 mg Oral Daily   flecainide   100 mg Oral Q12H   [START ON 10/08/2024] glipiZIDE  2.5 mg Oral QAC breakfast   Influenza vac split trivalent PF  0.5 mL Intramuscular Tomorrow-1000   insulin  aspart  0-5 Units Subcutaneous QHS   insulin  aspart  0-9 Units Subcutaneous TID WC   losartan  25 mg Oral Daily   pneumococcal 20-valent conjugate vaccine  0.5 mL Intramuscular Tomorrow-1000   warfarin  2.5 mg Oral ONCE-1600   Warfarin - Pharmacist Dosing Inpatient   Does not apply q1600   zolpidem   5 mg Oral QHS    PRN meds: acetaminophen  **OR** acetaminophen , albuterol , bisacodyl, colesevelam, hydrALAZINE, HYDROcodone -acetaminophen , HYDROmorphone (DILAUDID) injection, polyethylene glycol  Infusions:   cefTRIAXone  (ROCEPHIN )  IV 1 g (10/06/24 2114)    Antimicrobials: Anti-infectives (From admission, onward)    Start     Dose/Rate Route Frequency Ordered Stop   10/05/24 2200  cefTRIAXone  (ROCEPHIN ) 1 g in sodium chloride  0.9 % 100 mL IVPB        1 g 200 mL/hr over 30 Minutes Intravenous Every 24 hours 10/05/24 1513     10/04/24 2115  cefTRIAXone  (ROCEPHIN ) 2 g in sodium chloride  0.9 % 100 mL IVPB  Status:  Discontinued        2 g 200 mL/hr over 30 Minutes Intravenous  Once 10/04/24 2106 10/04/24 2108   10/04/24 2115  cefTRIAXone  (ROCEPHIN ) 1 g in sodium chloride  0.9 % 100 mL IVPB        1 g 200 mL/hr over 30 Minutes Intravenous  Once 10/04/24 2108 10/04/24 2359       Objective: Vitals:   10/07/24 0625 10/07/24 1005  BP: (!) 163/96 (!) 150/77  Pulse: (!) 109   Resp: 18 16  Temp: 97.7 F (36.5 C) (!) 97.5 F (36.4 C)  SpO2: 99% 100%    Intake/Output Summary (Last 24 hours) at 10/07/2024 1206 Last data filed at 10/07/2024 1000 Gross  per 24 hour  Intake 480 ml  Output 650 ml  Net -170 ml   Filed Weights   10/04/24 1429  Weight: 74.4 kg   Weight change:  Body mass index is 28.15 kg/m.   Physical Exam: General exam: Pleasant, elderly Caucasian female.  Not in distress. Skin: No rashes, lesions or ulcers. HEENT: Atraumatic, normocephalic, no obvious bleeding Lungs: Clear to auscultation bilaterally CVS: Rate controlled irregular rhythm, no murmur GI/Abd: Soft, nontender, nondistended, bowel sound present,   CNS: Alert, awake, oriented x 3. Psychiatry: Mood appropriate. Extremities: No pedal edema, no calf tenderness,   Data Review: I have personally reviewed the laboratory data and studies available.  F/u labs ordered Unresulted Labs (From admission, onward)     Start     Ordered   10/07/24 0500  Protime-INR  Daily,   R      10/06/24 0741            Signed, Chapman Rota, MD [facility] 10/07/2024

## 2024-10-07 NOTE — Progress Notes (Signed)
 PHARMACY - ANTICOAGULATION CONSULT NOTE  Pharmacy Consult for warfarin Indication: atrial fibrillation  Allergies  Allergen Reactions   Penicillins Hives        Patient Measurements: Height: 5' 4 (162.6 cm) Weight: 74.4 kg (164 lb) IBW/kg (Calculated) : 54.7 HEPARIN DW (KG): 70.2  Vital Signs: Temp: 97.7 F (36.5 C) (11/01 0625) Temp Source: Oral (10/31 1958) BP: 163/96 (11/01 0625) Pulse Rate: 109 (11/01 0625)  Labs: Recent Labs    10/04/24 1436 10/05/24 0549 10/05/24 1040 10/06/24 0405 10/06/24 0815 10/07/24 0534  HGB 15.4*  --  12.9 12.3  --   --   HCT 47.3*  --  39.6 36.9  --   --   PLT 310  --  292 263  --   --   APTT 45*  --   --   --   --   --   LABPROT 26.4* 27.5*  --   --  28.2* 26.4*  INR 2.3* 2.4*  --   --  2.5* 2.3*  CREATININE 1.69* 1.43*  --  1.32*  --  1.37*  CKTOTAL 410* 274*  --  105  --   --     Estimated Creatinine Clearance: 35.6 mL/min (A) (by C-G formula based on SCr of 1.37 mg/dL (H)).   Medical History: Past Medical History:  Diagnosis Date   Anxiety    Arthritis    Asthma    Bronchitis   Atherosclerosis 11/25/2017   noted on CT   Back pain    Depression    Diabetes mellitus    Diverticulosis 11/25/2017   mild, noted on CT   Fatty liver 11/25/2017   noted on CT   First degree AV block    History of kidney stones    Hydronephrosis 11/25/2017   mild left noted on CT   Hyperlipidemia    Hypertension    PAF (paroxysmal atrial fibrillation) (HCC)    Renal cyst 11/2017   Left, noted on US    Right renal artery stenosis    Sinus bradycardia    Skin cancer    questionable skin cancer on face years ago   UTI (urinary tract infection)    Medications:  Scheduled:   aspirin  EC  81 mg Oral Daily   DULoxetine   60 mg Oral Daily   flecainide   100 mg Oral Q12H   Influenza vac split trivalent PF  0.5 mL Intramuscular Tomorrow-1000   insulin  aspart  0-5 Units Subcutaneous QHS   insulin  aspart  0-9 Units Subcutaneous TID WC    pneumococcal 20-valent conjugate vaccine  0.5 mL Intramuscular Tomorrow-1000   Warfarin - Pharmacist Dosing Inpatient   Does not apply q1600   zolpidem   5 mg Oral QHS   Infusions:   cefTRIAXone  (ROCEPHIN )  IV 1 g (10/06/24 2114)   Assessment: Patient is a 33 YOF presenting to the ED in setting of a fall on 10/27 and unable to get up since. Patient with history of atrial fibrillation anticoagulated PTA with warfarin. Pharmacy has been consulted to dose warfarin for afib.   Initial INR 2.3. Patient's home regimen is warfarin 5mg  tablets; take 1 tablet Mondays and Wednesdays and take 1/2 tablet all other days as confirmed by patient. Patient reports has not taken a dose of warfarin yet today. Hgb and plt WNL.   Of note, patient reports she is taking warfarin instead of a DOAC for afib solely due to cost/insurance reasons, but states her insurance has recently changed and should now  cover Eliquis. She would like to transition to Eliquis if insurance would cover it.   INR 2.3 - continue warfarin dosing  Goal of Therapy:  INR 2-3 Monitor platelets by anticoagulation protocol: Yes   Plan:  -Give warfarin 2.5 mg po x1 tonight.  -Monitor CBC, INR daily.  Eliquis copay is $171.12- continue warfarin  Donny Alert, PharmD, [facility] Clinical Pharmacist Please see AMION for all Pharmacists' Contact Phone Numbers 10/07/2024, 7:14 AM

## 2024-10-07 NOTE — Progress Notes (Signed)
 Pts O2 was incorrectly documented at 78%. Rececked pts O2 is 98% on RA.

## 2024-10-07 NOTE — Plan of Care (Signed)
  Problem: Nutritional: Goal: Maintenance of adequate nutrition will improve Outcome: Progressing   Problem: Clinical Measurements: Goal: Will remain free from infection Outcome: Progressing   Problem: Safety: Goal: Ability to remain free from injury will improve Outcome: Progressing   Problem: Skin Integrity: Goal: Risk for impaired skin integrity will decrease Outcome: Progressing

## 2024-10-07 NOTE — Progress Notes (Signed)
 Discontinued do not place purewick order from 10/04/2024. Due to active order placed on 10/06/2024 to place external catheter.

## 2024-10-07 NOTE — Progress Notes (Signed)
 CCMD called pt's HR in 150-160s notified Dr. Chapman Rota. MD requests for HR recheck in 30 mins and update.   10/07/24 1518  Vitals  Temp 98 F (36.7 C)  Temp Source Oral  BP (!) 136/90  MAP (mmHg) 101  BP Location Right Arm  BP Method Automatic  Patient Position (if appropriate) Lying  Pulse Rate Source Dinamap  MEWS COLOR  MEWS Score Color Green  MEWS Score  MEWS Temp 0  MEWS Systolic 0  MEWS Pulse 1  MEWS RR 0  MEWS LOC 0  MEWS Score 1

## 2024-10-07 NOTE — Progress Notes (Signed)
 MEWS Progress Note  Patient Details Name: BECKA LAGASSE MRN: [id number] DOB: [birth year] Today's Date: 10/07/2024   MEWS Flowsheet Documentation:  Assess: MEWS Score Temp: 98 F (36.7 C) BP: 129/83 MAP (mmHg): 95 Pulse Rate: (!) 122 ECG Heart Rate: (!) 112 Resp: 17 Level of Consciousness: Alert SpO2: 97 % O2 Device: Room Air Patient Activity (if Appropriate): In bed Assess: MEWS Score MEWS Temp: 0 MEWS Systolic: 0 MEWS Pulse: 2 MEWS RR: 0 MEWS LOC: 0 MEWS Score: 2 MEWS Score Color: Yellow Assess: SIRS CRITERIA SIRS Temperature : 0 SIRS Respirations : 0 SIRS Pulse: 1 SIRS WBC: 0 SIRS Score Sum : 1 SIRS Temperature : 0 SIRS Pulse: 1 SIRS Respirations : 0 SIRS WBC: 0 SIRS Score Sum : 1 Assess: if the MEWS score is Yellow or Red Were vital signs accurate and taken at a resting state?: Yes Does the patient meet 2 or more of the SIRS criteria?: No MEWS guidelines implemented : Yes, yellow Treat MEWS Interventions: Considered administering scheduled or prn medications/treatments as ordered Take Vital Signs Increase Vital Sign Frequency : Yellow: Q2hr x1, continue Q4hrs until patient remains green for 12hrs Escalate MEWS: Escalate: Yellow: Discuss with charge nurse and consider notifying provider and/or RRT Notify: Charge Nurse/RN Name of Charge Nurse/RN Notified: Corean Suzy Suzy Corean KATHEE 10/07/2024, 6:58 PM

## 2024-10-07 NOTE — TOC Progression Note (Signed)
 Transition of Care [facility]) - Progression Note    Patient Details  Name: Peggy Chen MRN: [id number] Date of Birth: 03-Aug-1951  [facility] CM/SW Contact  Bridget Cordella Simmonds, LCSW Phone Number: 10/07/2024, 8:38 AM  Clinical Narrative:   Referral sent out in hub for SNF.    Expected Discharge Plan: Skilled Nursing Facility Barriers to Discharge: Continued Medical Work up, English As A Second Language Teacher, SNF Pending bed offer               Expected Discharge Plan and Services       Living arrangements for the past 2 months: Skilled Nursing Facility                                       Social Drivers of Health (SDOH) Interventions SDOH Screenings   Food Insecurity: No Food Insecurity (10/05/2024)  Housing: Low Risk  (10/05/2024)  Transportation Needs: No Transportation Needs (10/05/2024)  Utilities: Not At Risk (10/05/2024)  Social Connections: Moderately Integrated (10/05/2024)  Tobacco Use: Medium Risk (10/04/2024)    Readmission Risk Interventions     No data to display

## 2024-10-07 NOTE — Progress Notes (Signed)
 Loss PIV access unable to give IV pain med. Patient currently in pain and rates pain in lower back 10/10 gave PO  pain medication instead until patient has new PIV placed. IV consult to be placed.

## 2024-10-07 NOTE — Progress Notes (Signed)
 Physical Therapy Treatment Patient Details Name: Peggy Chen MRN: [id number] DOB: [birth year] Today's Date: 10/07/2024   History of Present Illness 73 y.o. female [facility]04[facility] after falling with pt on the ground for 2 days, unable to get up. Pt with rhabdomyolysis, UTI, and AKI. PMHx: DM2, HTN, HLD, paroxysmal A-fib on Coumadin , sinus bradycardia, first-degree AV block, arthritis, impaired mobility, diverticulosis, anxiety, depression    PT Comments  Pt received in supine, A&O to self and does not appear oriented fully to location/situation/time; pt agreeable to therapy session with encouragement with NT present also to assist with hygiene and for safety during session. Pt needing up to +2 maxA to perform bed mobility and lateral seated scooting toward HOB. Posterior imbalance while seated throughout session, pt tolerated sitting EOB ~8 mins with CGA to modA needed to maintain balance. Pt c/o increased L foot and knee pain, pt with some L knee edema and redness over L foot plantar surface, RN/MD notified, pt also tachy to 148 bpm with transfer to/from EOB. NT arrived at beginning of session and reports plan for transfer to another unit, so after NT/PTA assist with hygiene and linen change, pt returned to supine with heels floated for comfort. Patient will benefit from continued inpatient follow up therapy, <3 hours/day.   If plan is discharge home, recommend the following: A lot of help with bathing/dressing/bathroom;Assistance with cooking/housework;Assist for transportation;Help with stairs or ramp for entrance;Two people to help with walking and/or transfers;Supervision due to cognitive status;Direct supervision/assist for medications management;Direct supervision/assist for financial management   Can travel by private vehicle     No  Equipment Recommendations  Rolling walker (2 wheels);BSC/3in1;Wheelchair (measurements PT);Wheelchair cushion (measurements PT)    Recommendations  for Other Services       Precautions / Restrictions Precautions Precautions: Fall Recall of Precautions/Restrictions: Impaired Precaution/Restrictions Comments: watch HR; decreased orientation on 11/1 Restrictions Weight Bearing Restrictions Per Provider Order: No     Mobility  Bed Mobility Overal bed mobility: Needs Assistance Bed Mobility: Rolling, Sidelying to Sit, Sit to Supine Rolling: Mod assist, Used rails Sidelying to sit: Max assist, +2 for safety/equipment, Used rails   Sit to supine: Max assist, Used rails   General bed mobility comments: increased time/effort to perform; maxA from flat bed to sit on R EOB and return to supine. +2 maxA for lateral seated scooting x3 reps toward HOB, posterior imbalance    Transfers Overall transfer level: Needs assistance                 General transfer comment: defer, pt very tachy (to 148 bpm) sitting EOB and unsafe to attempt standing, posterior lean while performing seated lateral scooting toward HOB. c/o LLE pain, RN/MD notified    Ambulation/Gait               General Gait Details: unable this date   Stairs             Wheelchair Mobility     Tilt Bed    Modified Rankin (Stroke Patients Only)       Balance Overall balance assessment: Needs assistance Sitting-balance support: No upper extremity supported, Feet supported Sitting balance-Leahy Scale: Poor Sitting balance - Comments: near-constant posterior lean with static and dynamic seated tasks       Standing balance comment: defer, LLE pain and tachycardia/confusion limiting her  Communication Communication Communication: No apparent difficulties  Cognition Arousal: Alert Behavior During Therapy: Flat affect   PT - Cognitive impairments: No family/caregiver present to determine baseline, Problem solving, Sequencing, Orientation, Awareness, Memory, Attention, Initiation, Safety/Judgement    Orientation impairments: Place, Time, Situation                   PT - Cognition Comments: Increased difficulty motor planning and sequening. Step by step cueing with increased time to process. Pt internally distracted due to c/o L knee pain and reporting also some L foot pain (redness/swelling observed on L foot), MD/RN notified as she was not specific about where her legs were hurting on prior therapy sessions. Following commands: Impaired Following commands impaired: Follows one step commands with increased time, Follows multi-step commands inconsistently    Cueing Cueing Techniques: Verbal cues  Exercises Other Exercises Other Exercises: seated BLE A/AAROM: LAQ x5 reps ea    General Comments General comments (skin integrity, edema, etc.): HR 130's bpm sitting and resting EOB/supine and to 148 bpm during transfer to/from supine/EOB. Pt heels floated once back in supine, NT in room assisting pt with peri area hygiene and placing new purewick; gown changed while pt sitting EOB and pt assisted to clean under bosoms while PTA/NT in room and changing her gown/bed pad.      Pertinent Vitals/Pain Pain Assessment Pain Assessment: Faces Faces Pain Scale: Hurts whole lot Pain Location: L knee and foot, back but does not c/o back pain to palpation. MD/RN notified of pt c/o L knee pain as it was not localized to this area in PT/OT evaluations. Pain Descriptors / Indicators: Cramping, Discomfort, Grimacing, Guarding Pain Intervention(s): Limited activity within patient's tolerance, Monitored during session, Repositioned    Home Living                          Prior Function            PT Goals (current goals can now be found in the care plan section) Acute Rehab PT Goals Patient Stated Goal: to get stronger PT Goal Formulation: With patient Time For Goal Achievement: 10/19/24 Progress towards PT goals: Not progressing toward goals - comment (MD/RN notified of new c/o  LLE pain and tachycardia)    Frequency    Min 2X/week      PT Plan      Co-evaluation              AM-PAC PT 6 Clicks Mobility   Outcome Measure  Help needed turning from your back to your side while in a flat bed without using bedrails?: A Lot Help needed moving from lying on your back to sitting on the side of a flat bed without using bedrails?: A Lot Help needed moving to and from a bed to a chair (including a wheelchair)?: Total Help needed standing up from a chair using your arms (e.g., wheelchair or bedside chair)?: Total Help needed to walk in hospital room?: Total Help needed climbing 3-5 steps with a railing? : Total 6 Click Score: 8    End of Session Equipment Utilized During Treatment: Other (comment) (defer standing 2/2 tachy so did not use gait belt) Activity Tolerance: Patient limited by pain;Treatment limited secondary to medical complications (Comment);Other (comment) (tachycardia/confusion limiting activity tolerance) Patient left: with call bell/phone within reach;in bed;with bed alarm set;with nursing/sitter in room;Other (comment) (NT assisting her with bath in bed) Nurse Communication: Mobility status;Need for lift equipment;Other (comment) (  c/o LLE pain; pt tachy and decreased orientation today) PT Visit Diagnosis: Unsteadiness on feet (R26.81);Other abnormalities of gait and mobility (R26.89);Muscle weakness (generalized) (M62.81);History of falling (Z91.81)     Time: 8390-8370 PT Time Calculation (min) (ACUTE ONLY): 20 min  Charges:    $Therapeutic Activity: 8-22 mins PT General Charges $$ ACUTE PT VISIT: 1 Visit                     Candida Vetter P., PTA Acute Rehabilitation Services Secure Chat Preferred 9a-5:30pm Office: [phone]    Connell HERO [facility] 10/07/2024, 4:49 PM

## 2024-10-07 NOTE — Plan of Care (Signed)
  Problem: Coping: Goal: Ability to adjust to condition or change in health will improve Outcome: Progressing   Problem: Nutritional: Goal: Maintenance of adequate nutrition will improve Outcome: Progressing   Problem: Education: Goal: Knowledge of General Education information will improve Description: Including pain rating scale, medication(s)/side effects and non-pharmacologic comfort measures Outcome: Progressing   Problem: Activity: Goal: Risk for activity intolerance will decrease Outcome: Progressing   Problem: Nutrition: Goal: Adequate nutrition will be maintained Outcome: Progressing

## 2024-10-07 NOTE — TOC Progression Note (Signed)
 Transition of Care [facility]) - Progression Note    Patient Details  Name: Peggy Chen MRN: [id number] Date of Birth: 1951-05-09  Transition of Care [facility] CM/SW Contact  Bridget Cordella Simmonds, LCSW Phone Number: 10/07/2024, 1:41 PM  Clinical Narrative:   Bed offers provided to pt on medicare choice document.  She will review.     Expected Discharge Plan: Skilled Nursing Facility Barriers to Discharge: Continued Medical Work up, English As A [employer] Language Teacher, SNF Pending bed offer               Expected Discharge Plan and Services       Living arrangements for the past 2 months: Skilled Nursing Facility                                       Social Drivers of Health (SDOH) Interventions SDOH Screenings   Food Insecurity: No Food Insecurity (10/05/2024)  Housing: Low Risk  (10/05/2024)  Transportation Needs: No Transportation Needs (10/05/2024)  Utilities: Not At Risk (10/05/2024)  Social Connections: Moderately Integrated (10/05/2024)  Tobacco Use: Medium Risk (10/04/2024)    Readmission Risk Interventions     No data to display

## 2024-10-08 ENCOUNTER — Observation Stay (HOSPITAL_COMMUNITY)

## 2024-10-08 DIAGNOSIS — B961 Klebsiella pneumoniae [K. pneumoniae] as the cause of diseases classified elsewhere: Secondary | ICD-10-CM | POA: Diagnosis present

## 2024-10-08 DIAGNOSIS — Z23 Encounter for immunization: Secondary | ICD-10-CM | POA: Diagnosis present

## 2024-10-08 DIAGNOSIS — I4891 Unspecified atrial fibrillation: Secondary | ICD-10-CM | POA: Diagnosis not present

## 2024-10-08 DIAGNOSIS — M1712 Unilateral primary osteoarthritis, left knee: Secondary | ICD-10-CM | POA: Diagnosis present

## 2024-10-08 DIAGNOSIS — R9431 Abnormal electrocardiogram [ECG] [EKG]: Secondary | ICD-10-CM

## 2024-10-08 DIAGNOSIS — D631 Anemia in chronic kidney disease: Secondary | ICD-10-CM | POA: Diagnosis present

## 2024-10-08 DIAGNOSIS — Z794 Long term (current) use of insulin: Secondary | ICD-10-CM | POA: Diagnosis not present

## 2024-10-08 DIAGNOSIS — F419 Anxiety disorder, unspecified: Secondary | ICD-10-CM | POA: Diagnosis present

## 2024-10-08 DIAGNOSIS — N179 Acute kidney failure, unspecified: Secondary | ICD-10-CM

## 2024-10-08 DIAGNOSIS — T796XXA Traumatic ischemia of muscle, initial encounter: Secondary | ICD-10-CM | POA: Diagnosis not present

## 2024-10-08 DIAGNOSIS — N3000 Acute cystitis without hematuria: Secondary | ICD-10-CM | POA: Diagnosis present

## 2024-10-08 DIAGNOSIS — Z7409 Other reduced mobility: Secondary | ICD-10-CM | POA: Diagnosis present

## 2024-10-08 DIAGNOSIS — N390 Urinary tract infection, site not specified: Secondary | ICD-10-CM

## 2024-10-08 DIAGNOSIS — M6282 Rhabdomyolysis: Secondary | ICD-10-CM | POA: Diagnosis present

## 2024-10-08 DIAGNOSIS — R627 Adult failure to thrive: Secondary | ICD-10-CM | POA: Diagnosis present

## 2024-10-08 DIAGNOSIS — E785 Hyperlipidemia, unspecified: Secondary | ICD-10-CM | POA: Diagnosis present

## 2024-10-08 DIAGNOSIS — I129 Hypertensive chronic kidney disease with stage 1 through stage 4 chronic kidney disease, or unspecified chronic kidney disease: Secondary | ICD-10-CM | POA: Diagnosis present

## 2024-10-08 DIAGNOSIS — E1165 Type 2 diabetes mellitus with hyperglycemia: Secondary | ICD-10-CM | POA: Diagnosis present

## 2024-10-08 DIAGNOSIS — Z7901 Long term (current) use of anticoagulants: Secondary | ICD-10-CM | POA: Diagnosis not present

## 2024-10-08 DIAGNOSIS — I4819 Other persistent atrial fibrillation: Secondary | ICD-10-CM | POA: Diagnosis present

## 2024-10-08 DIAGNOSIS — K5730 Diverticulosis of large intestine without perforation or abscess without bleeding: Secondary | ICD-10-CM | POA: Diagnosis present

## 2024-10-08 DIAGNOSIS — J45909 Unspecified asthma, uncomplicated: Secondary | ICD-10-CM | POA: Diagnosis present

## 2024-10-08 DIAGNOSIS — E1122 Type 2 diabetes mellitus with diabetic chronic kidney disease: Secondary | ICD-10-CM | POA: Diagnosis present

## 2024-10-08 DIAGNOSIS — F32A Depression, unspecified: Secondary | ICD-10-CM | POA: Diagnosis present

## 2024-10-08 DIAGNOSIS — E860 Dehydration: Secondary | ICD-10-CM | POA: Diagnosis present

## 2024-10-08 DIAGNOSIS — I340 Nonrheumatic mitral (valve) insufficiency: Secondary | ICD-10-CM | POA: Diagnosis present

## 2024-10-08 DIAGNOSIS — N1832 Chronic kidney disease, stage 3b: Secondary | ICD-10-CM | POA: Diagnosis present

## 2024-10-08 DIAGNOSIS — I10 Essential (primary) hypertension: Secondary | ICD-10-CM | POA: Diagnosis not present

## 2024-10-08 DIAGNOSIS — I440 Atrioventricular block, first degree: Secondary | ICD-10-CM | POA: Diagnosis present

## 2024-10-08 LAB — ECHOCARDIOGRAM COMPLETE
AR max vel: 1.64 cm2
AV Peak grad: 10.1 mmHg
Ao pk vel: 1.59 m/s
Area-P 1/2: 4.8 cm2
Height: 64 in
S' Lateral: 3.3 cm
Weight: 2624 [oz_av]

## 2024-10-08 LAB — GLUCOSE, CAPILLARY
Glucose-Capillary: 126 mg/dL — ABNORMAL HIGH (ref 70–99)
Glucose-Capillary: 184 mg/dL — ABNORMAL HIGH (ref 70–99)
Glucose-Capillary: 105 mg/dL — ABNORMAL HIGH (ref 70–99)
Glucose-Capillary: 167 mg/dL — ABNORMAL HIGH (ref 70–99)

## 2024-10-08 LAB — MAGNESIUM: Magnesium: 1.5 mg/dL — ABNORMAL LOW (ref 1.7–2.4)

## 2024-10-08 LAB — PROTIME-INR
INR: 2.3 — ABNORMAL HIGH (ref 0.8–1.2)
Prothrombin Time: 26.2 s — ABNORMAL HIGH (ref 11.4–15.2)

## 2024-10-08 MED ORDER — WARFARIN SODIUM 2.5 MG PO TABS
2.5000 mg | ORAL_TABLET | Freq: Once | ORAL | Status: AC
  Administered 2024-10-08: 2.5 mg via ORAL
  Filled 2024-10-08: qty 1

## 2024-10-08 MED ORDER — CEFADROXIL 500 MG PO CAPS
500.0000 mg | ORAL_CAPSULE | Freq: Two times a day (BID) | ORAL | Status: AC
  Administered 2024-10-08 – 2024-10-09 (×4): 500 mg via ORAL
  Filled 2024-10-08 (×4): qty 1

## 2024-10-08 MED ORDER — MAGNESIUM SULFATE 4 GM/100ML IV SOLN
4.0000 g | Freq: Once | INTRAVENOUS | Status: AC
  Administered 2024-10-08: 4 g via INTRAVENOUS
  Filled 2024-10-08: qty 100

## 2024-10-08 MED ORDER — LOSARTAN POTASSIUM 25 MG PO TABS
12.5000 mg | ORAL_TABLET | Freq: Every day | ORAL | Status: DC
  Administered 2024-10-09 – 2024-10-10 (×2): 12.5 mg via ORAL
  Filled 2024-10-08 (×2): qty 1

## 2024-10-08 MED ORDER — DILTIAZEM HCL 60 MG PO TABS
60.0000 mg | ORAL_TABLET | Freq: Four times a day (QID) | ORAL | Status: DC
  Administered 2024-10-08 (×4): 60 mg via ORAL
  Filled 2024-10-08 (×3): qty 1

## 2024-10-08 NOTE — Progress Notes (Signed)
 PROGRESS NOTE  Peggy Chen  DOB: Mar 26, 1951  PCP: Avva, Ravisankar, MD FMW:[id number]  DOA: 10/04/2024  LOS: 0 days  Hospital Day: 5  Subjective: Patient was seen and examined this morning.  Lying down on bed.  Not in distress. Brother on the phone. Yesterday afternoon, patient went to RVR again with a heart rate in 150s to 160s and was started on Cardizem drip. Remains on Cardizem drip this morning with heart rate in 80s but still A-fib. On PT eval yesterday, patient complained of left foot, left knee pain.  X-rays were obtained  Brief narrative: Peggy Chen is a 73 y.o. female with PMH significant for DM2, HTN, HLD, paroxysmal A-fib on Coumadin , sinus bradycardia, first-degree AV block, arthritis, impaired mobility, diverticulosis, anxiety, depression Lives at home.  Has difficulty with ambulation.  At baseline, uses a walker.  Patient had a fall on the evening of Monday 10/27. It sounds like her walker hit something while she was going to the bathroom.  She fell to the ground, did not hit her head, did not pass out, was too weak to get up on her own.  She spent most of the time crawling around the floor.  Not having heard from her for 2 days, a friend of her called welfare check.  EMS found on the floor, complaining of left hip pain and brought to the ED.  Workup in ED did not show any fracture, showed AKI, rhabdomyolysis, UTI Was also in A-fib with RVR and started on Cardizem drip Admitted to [facility]  Assessment/Plan: Klebsiella pneumoniae UTI Likely cause of weakness and fall  Urinalysis showed cloudy yellow urine with moderate hemoglobin, moderate leukocytes, positive nitrite and many bacteria Urine culture grew more than 100,000 CFU per mL of pansensitive Klebsiella pneumoniae Currently improving on IV Rocephin  WBC count improving Okay to switch to cefadroxil oral to complete 7-day course. Recent Labs  Lab 10/04/24 1436 10/05/24 1040 10/06/24 0405  WBC 17.2* 16.5*  11.9*   AKI on CKD 3B Rhabdomyolysis Was on the floor for 2 days, unable to get up Looked dehydrated on presentation. Initial CK was elevated to over 400.   Baseline creatinine less than 1.4.  Presented with creatinine elevated to 1.69 With IV hydration, CK and creatinine improved. Recent Labs    10/30/23 1913 10/30/23 1941 10/31/23 0247 11/01/23 0233 11/02/23 0255 12/15/23 2030 04/24/24 1900 10/04/24 1436 10/05/24 0549 10/06/24 0405 10/07/24 0534  BUN 44* 43* 42* 36* 29* 21 33* 38* 37* 25* 24*  CREATININE 2.10* 2.10* 2.04* 1.54* 1.52* 1.44* 1.36* 1.69* 1.43* 1.32* 1.37*  CO2 27  --  24 24 25 25 22  21* 22 22 24    A-fib with RVR  paroxysmal A-fib H/o first-degree AV block PTA meds-Flecainide  75 mg twice daily but missed it for 2 days at least after the fall. This hospitalization, patient has been consistently in A-fib and intermittently in RVR.   Has been off and on Cardizem drip. 10/31, increased dose of flecainide  from 75 mg twice daily to 100 mg twice daily. 11/1, had to be restarted on Cardizem drip. Overnight remains on Cardizem drip this morning with heart rate in 80s but still A-fib. Regarding anticoagulation, patient has been chronically anticoagulated with Coumadin .   Cardiology consult obtained.  Noted the plan to switch from flecainide  to oral Cardizem. Patient states she was on Eliquis in the past but had to switch to Coumadin  few years ago because of the cost.  Her Eliquis co-pay is still high at $  171.  Hence patient is not yet willing to switch to Eliquis. INR therapeutic between 2 and 3.  Pharmacy consulted Recent Labs  Lab 10/04/24 1436 10/05/24 0549 10/06/24 0815 10/07/24 0534 10/08/24 0854  INR 2.3* 2.4* 2.5* 2.3* 2.3*    Prolonged Qtc Initial EKG with QTc prolonged at 462 ms Monitor electrolytes Repeat EKG 10/30 showed QTc improved to 439 ms  Hypokalemia/hypomagnesemia Potassium level improved with replacement. Recent Labs  Lab 10/04/24 1436  10/05/24 0549 10/06/24 0405 10/07/24 0534  K 3.3* 3.3* 3.3* 3.6  MG  --  1.4*  --   --   PHOS  --  3.1  --   --    Type 2 diabetes mellitus A1c 6.1 on 09/25/2024  PTA meds-none. Currently on glipizide 2.5 mg daily and SSI/Accu-Cheks.  Blood sugar level slightly better after glipizide was added yesterday.  Continue to monitor Recent Labs  Lab 10/07/24 1203 10/07/24 1541 10/07/24 1739 10/07/24 2047 10/08/24 0841  GLUCAP 277* 162* 182* 201* 126*   HTN Not on antihypertensives.   Has been started on losartan 25 mg daily  HLD Continue Whelchol  Diverticulosis Chronic anemia H&H in normal range. Recent Labs    12/15/23 2030 04/24/24 1900 10/04/24 1436 10/05/24 1040 10/06/24 0405  HGB 13.7 13.3 15.4* 12.9 12.3  MCV 88.9 92.0 91.1 91.5 91.3   Anxiety/depression Continue Cymbalta , nightly Ambien   Left knee pain  Left foot pain Chronic arthritic pain Patient has chronic arthritic pain with mobility impairment, uses walker at home and was on Norco as needed.   11/1, left foot x-ray showed degenerative changes as well as findings suggestive of gout. Given colchicine 1.2 mg followed by 0.6 mg.  Feels much better this morning 11/1, left knee x-ray showed severe tricompartmental osteoarthritis with large suprapatellar joint effusion likely reactive.  On exam, left knee is warm, swollen and tender. Given severity of pain and inability to ambulate, may benefit from arthrocentesis.  Will discuss with orthopedics  Fall Impaired mobility It seems the fall was mechanical in nature.  Likely confounded by weakness from UTI. Imagings negative for fracture PT eval obtained.  SNF recommended  Mobility:  PT Orders:   PT Follow up Rec: Skilled Nursing-Short Term Rehab (<3 Hours/Day)10/07/2024 1600   Goals of care:   Code Status: Full Code.  Confirmed with patient   DVT prophylaxis:   warfarin (COUMADIN ) tablet 2.5 mg   Antimicrobials: None Fluid: Not on IV  fluid Consultants: Cardiology, Family Communication: Brother at bedside  Status: Observation Level of care:  Telemetry   Patient is from: Home Anticipated d/c to: Pending clinical course, needs SNF ultimately  Diet:  Diet Order             Diet regular Room service appropriate? Yes; Fluid consistency: Thin  Diet effective now                    Scheduled Meds:  allopurinol   50 mg Oral Daily   aspirin  EC  81 mg Oral Daily   cefadroxil  500 mg Oral BID   diltiazem  60 mg Oral QID   DULoxetine   60 mg Oral Daily   glipiZIDE  2.5 mg Oral QAC breakfast   Influenza vac split trivalent PF  0.5 mL Intramuscular Tomorrow-1000   insulin  aspart  0-5 Units Subcutaneous QHS   insulin  aspart  0-9 Units Subcutaneous TID WC   [START ON 10/09/2024] losartan  12.5 mg Oral Daily   pneumococcal 20-valent conjugate vaccine  0.5 mL Intramuscular Tomorrow-1000   warfarin  2.5 mg Oral ONCE-1600   Warfarin - Pharmacist Dosing Inpatient   Does not apply q1600   zolpidem   5 mg Oral QHS    PRN meds: acetaminophen  **OR** acetaminophen , albuterol , bisacodyl, colesevelam, hydrALAZINE, HYDROcodone -acetaminophen , HYDROmorphone (DILAUDID) injection, polyethylene glycol   Infusions:     Antimicrobials: Anti-infectives (From admission, onward)    Start     Dose/Rate Route Frequency Ordered Stop   10/08/24 1000  cefadroxil (DURICEF) capsule 500 mg        500 mg Oral 2 times daily 10/08/24 0834 10/10/24 0959   10/05/24 2200  cefTRIAXone  (ROCEPHIN ) 1 g in sodium chloride  0.9 % 100 mL IVPB  Status:  Discontinued        1 g 200 mL/hr over 30 Minutes Intravenous Every 24 hours 10/05/24 1513 10/08/24 0834   10/04/24 2115  cefTRIAXone  (ROCEPHIN ) 2 g in sodium chloride  0.9 % 100 mL IVPB  Status:  Discontinued        2 g 200 mL/hr over 30 Minutes Intravenous  Once 10/04/24 2106 10/04/24 2108   10/04/24 2115  cefTRIAXone  (ROCEPHIN ) 1 g in sodium chloride  0.9 % 100 mL IVPB        1 g 200 mL/hr over 30  Minutes Intravenous  Once 10/04/24 2108 10/04/24 2359       Objective: Vitals:   10/08/24 0900 10/08/24 1103  BP: 97/77 (!) 144/89  Pulse:    Resp: 17   Temp: 98.4 F (36.9 C)   SpO2:      Intake/Output Summary (Last 24 hours) at 10/08/2024 1124 Last data filed at 10/08/2024 0600 Gross per 24 hour  Intake 912.38 ml  Output 2325 ml  Net -1412.62 ml   Filed Weights   10/04/24 1429  Weight: 74.4 kg   Weight change:  Body mass index is 28.15 kg/m.   Physical Exam: General exam: Pleasant, elderly Caucasian female.  Not in distress. Skin: No rashes, lesions or ulcers. HEENT: Atraumatic, normocephalic, no obvious bleeding Lungs: Clear to auscultation bilaterally CVS: Rate controlled irregular rhythm, no murmur GI/Abd: Soft, nontender, nondistended, bowel sound present,   CNS: Alert, awake, oriented x 3. Psychiatry: Mood appropriate. Extremities: No pedal edema, no calf tenderness, left knee warm, swollen and tender  Data Review: I have personally reviewed the laboratory data and studies available.  F/u labs ordered Unresulted Labs (From admission, onward)     Start     Ordered   10/09/24 0500  Basic metabolic panel  Daily,   R     Question:  Specimen collection method  Answer:  Lab=Lab collect   10/08/24 1038   10/08/24 1038  Magnesium   Once,   R       Question:  Specimen collection method  Answer:  Lab=Lab collect   10/08/24 1038   10/07/24 0500  Protime-INR  Daily,   R      10/06/24 0741            Signed, Chapman Rota, MD [facility] 10/08/2024

## 2024-10-08 NOTE — Plan of Care (Signed)

## 2024-10-08 NOTE — Consult Note (Signed)
 CARDIOLOGY CONSULT NOTE    Patient ID: Peggy Chen; [id number]; 05/10/1951   Admit date: 10/04/2024 Date of Consult: 10/08/2024  Primary Care Provider: Janey Santos, MD Primary Cardiologist:  Primary Electrophysiologist:   History of Present Illness:   Peggy Chen is a 73 year old F known to have intermittent A-fib s/p failed DCCV in 2015 and has been in NSR since then with intermittent A-fib with RVR, HTN, DM 2, HLD is currently admitted to hospitalist and further management of Klebsiella pneumonia UTI, AKI and atrial fibrillation with RVR.  She presented s/p mechanical fall.  Asymptomatic now but she gets palpitations when she is in RVR.  Last EKG with NSR was from January 2025 where she was noted to be in NSR with fascicular AV block, PR interval 213 ms.  She was on flecainide  75 mg twice daily prior to arrival. Currently on flecainide  100 mg twice daily and diltiazem drip 10 mg/h.  Telemetry reviewed, in A-fib, HR 70-80s.  Past Medical History:  Diagnosis Date   Anxiety    Arthritis    Asthma    Bronchitis   Atherosclerosis 11/25/2017   noted on CT   Back pain    Depression    Diabetes mellitus    Diverticulosis 11/25/2017   mild, noted on CT   Fatty liver 11/25/2017   noted on CT   First degree AV block    History of kidney stones    Hydronephrosis 11/25/2017   mild left noted on CT   Hyperlipidemia    Hypertension    PAF (paroxysmal atrial fibrillation) (HCC)    Renal cyst 11/2017   Left, noted on US    Right renal artery stenosis    Sinus bradycardia    Skin cancer    questionable skin cancer on face years ago   UTI (urinary tract infection)     Past Surgical History:  Procedure Laterality Date   BREAST CYST REMOVAL     RIGHT BREAST   CARDIOVERSION N/A 04/17/2014   Procedure: CARDIOVERSION;  Surgeon: Peggy JINNY Passe, MD;  Location: [facility] ENDOSCOPY;  Service: Cardiovascular;  Laterality: N/A;   CARDIOVERSION  05/2014   attempted DC failed   COLON  SURGERY     May 2000   COLONOSCOPY     CYSTOSCOPY/URETEROSCOPY/HOLMIUM LASER/STENT PLACEMENT Left 11/26/2017   Procedure: CYSTOSCOPY/STENT PLACEMENT;  Surgeon: Peggy Glance, MD;  Location: [facility] ORS;  Service: Urology;  Laterality: Left;   CYSTOSCOPY/URETEROSCOPY/HOLMIUM LASER/STENT PLACEMENT Left 12/23/2017   Procedure: CYSTOSCOPY/ LEFT URETEROSCOPY;  Surgeon: Peggy Glance, MD;  Location: [facility] ORS;  Service: Urology;  Laterality: Left;   TONSILLECTOMY     US  ECHOCARDIOGRAPHY  12/30/2009   EF 55-60%       Inpatient Medications: Scheduled Meds:  allopurinol   50 mg Oral Daily   aspirin  EC  81 mg Oral Daily   cefadroxil  500 mg Oral BID   DULoxetine   60 mg Oral Daily   flecainide   100 mg Oral Q12H   glipiZIDE  2.5 mg Oral QAC breakfast   Influenza vac split trivalent PF  0.5 mL Intramuscular Tomorrow-1000   insulin  aspart  0-5 Units Subcutaneous QHS   insulin  aspart  0-9 Units Subcutaneous TID WC   losartan  25 mg Oral Daily   pneumococcal 20-valent conjugate vaccine  0.5 mL Intramuscular Tomorrow-1000   Warfarin - Pharmacist Dosing Inpatient   Does not apply q1600   zolpidem   5 mg Oral QHS   Continuous Infusions:  diltiazem (CARDIZEM) infusion 10 mg/hr (  10/08/24 0656)   PRN Meds: acetaminophen  **OR** acetaminophen , albuterol , bisacodyl, colesevelam, hydrALAZINE, HYDROcodone -acetaminophen , HYDROmorphone (DILAUDID) injection, polyethylene glycol  Allergies:    Allergies  Allergen Reactions   Penicillins Hives         Social History:   Social History   Socioeconomic History   Marital status: Widowed    Spouse name: Not on file   Number of children: Not on file   Years of education: Not on file   Highest education level: Not on file  Occupational History   Not on file  Tobacco Use   Smoking status: Former    Current packs/day: 0.00    Types: Cigarettes    Quit date: 06/22/1986    Years since quitting: 38.3   Smokeless tobacco: Never  Vaping Use   Vaping status:  Never Used  Substance and Sexual Activity   Alcohol use: Yes    Comment: occasional   Drug use: No   Sexual activity: Not on file  Other Topics Concern   Not on file  Social History Narrative   Not on file   Social Drivers of Health   Financial Resource Strain: Not on file  Food Insecurity: No Food Insecurity (10/05/2024)   Hunger Vital Sign    Worried About Running Out of Food in the Last Year: Never true    Ran Out of Food in the Last Year: Never true  Transportation Needs: No Transportation Needs (10/05/2024)   PRAPARE - Administrator, Civil Service (Medical): No    Lack of Transportation (Non-Medical): No  Physical Activity: Not on file  Stress: Not on file  Social Connections: Moderately Integrated (10/05/2024)   Social Connection and Isolation Panel    Frequency of Communication with Friends and Family: More than three times a week    Frequency of Social Gatherings with Friends and Family: Twice a week    Attends Religious Services: 1 to 4 times per year    Active Member of [employer] or Organizations: Yes    Attends Banker Meetings: More than 4 times per year    Marital Status: Widowed  Intimate Partner Violence: Not At Risk (10/05/2024)   Humiliation, Afraid, Rape, and Kick questionnaire    Fear of Current or Ex-Partner: No    Emotionally Abused: No    Physically Abused: No    Sexually Abused: No    Family History:    Family History  Problem Relation Age of Onset   COPD Mother    Heart attack Father      ROS:  Please see the history of present illness.  ROS  All other ROS reviewed and negative.     Physical Exam/Data:   Vitals:   10/07/24 2304 10/08/24 0007 10/08/24 0432 10/08/24 0900  BP: 125/78  117/60 97/77  Pulse: 84  79   Resp:  18 18 17   Temp:  98 F (36.7 C) 98 F (36.7 C) 98.4 F (36.9 C)  TempSrc:  Oral Oral Oral  SpO2: 99%  99%   Weight:      Height:        Intake/Output Summary (Last 24 hours) at 10/08/2024  1003 Last data filed at 10/08/2024 0600 Gross per 24 hour  Intake 912.38 ml  Output 2325 ml  Net -1412.62 ml   Filed Weights   10/04/24 1429  Weight: 74.4 kg   Body mass index is 28.15 kg/m.  General:  Well nourished, well developed, in no acute distress HEENT: normal  Lymph: no adenopathy Neck: no JVD Endocrine:  No thryomegaly Vascular: No carotid bruits; FA pulses 2+ bilaterally without bruits  Cardiac:  normal S1, S2; RRR; no murmur  Lungs:  clear to auscultation bilaterally, no wheezing, rhonchi or rales  Abd: soft, nontender, no hepatomegaly  Ext: no edema Musculoskeletal:  No deformities, BUE and BLE strength normal and equal Skin: warm and dry  Neuro:  CNs 2-12 intact, no focal abnormalities noted Psych:  Normal affect    Laboratory Data:  Chemistry Recent Labs  Lab 10/05/24 0549 10/06/24 0405 10/07/24 0534  NA 139 134* 135  K 3.3* 3.3* 3.6  CL 101 100 100  CO2 22 22 24   GLUCOSE 117* 164* 164*  BUN 37* 25* 24*  CREATININE 1.43* 1.32* 1.37*  CALCIUM 9.0 8.1* 8.3*  GFRNONAA 38* 42* 41*  ANIONGAP 16* 12 11    Recent Labs  Lab 10/04/24 1436  PROT 7.5  ALBUMIN 3.6  AST 31  ALT 17  ALKPHOS 65  BILITOT 1.4*   Hematology Recent Labs  Lab 10/04/24 1436 10/05/24 1040 10/06/24 0405  WBC 17.2* 16.5* 11.9*  RBC 5.19* 4.33 4.04  HGB 15.4* 12.9 12.3  HCT 47.3* 39.6 36.9  MCV 91.1 91.5 91.3  MCH 29.7 29.8 30.4  MCHC 32.6 32.6 33.3  RDW 12.7 13.1 13.0  PLT 310 292 263   Cardiac EnzymesNo results for input(s): TROPONINI in the last 168 hours. No results for input(s): TROPIPOC in the last 168 hours.  BNPNo results for input(s): BNP, PROBNP in the last 168 hours.  DDimer No results for input(s): DDIMER in the last 168 hours.  Radiology/Studies:  DG Knee 3 Views Left Result Date: 10/07/2024 CLINICAL DATA:  Left knee pain EXAM: LEFT KNEE - 3 VIEW COMPARISON:  None Available. FINDINGS: Frontal, oblique, and cross-table lateral views of the left  knee are obtained. No fracture, subluxation, or dislocation. There is severe 3 compartmental osteoarthritis with marked joint space narrowing and osteophyte formation in all 3 compartments. Large suprapatellar joint effusion is likely reactive. Remaining soft tissues are unremarkable. IMPRESSION: 1. Severe 3 compartmental osteoarthritis. 2. Large suprapatellar joint effusion, likely reactive. 3. No acute fracture. Electronically Signed   By: Ozell Daring M.D.   On: 10/07/2024 18:56   DG Foot Complete Left Result Date: 10/07/2024 CLINICAL DATA:  Left foot pain EXAM: LEFT FOOT - COMPLETE 3 VIEW COMPARISON:  None Available. FINDINGS: There is no evidence of fracture or dislocation. Diffuse osteopenia. Diffuse joint space narrowing of the foot, most pronounced at the great toe metatarsal phalangeal joint and fourth toe distal interphalangeal joint, where there are erosive changes. Apparent juxta-articular erosion involving the lateral aspect of the great toe proximal phalangeal base. Dorsal and plantar calcaneal spur. Soft tissues are unremarkable. IMPRESSION: Degenerative changes of the foot with findings suggestive of inflammatory arthropathy such as gout. Electronically Signed   By: Limin  Xu M.D.   On: 10/07/2024 18:15   CT Cervical Spine Wo Contrast Result Date: 10/04/2024 CLINICAL DATA:  Status post fall. EXAM: CT CERVICAL SPINE WITHOUT CONTRAST TECHNIQUE: Multidetector CT imaging of the cervical spine was performed without intravenous contrast. Multiplanar CT image reconstructions were also generated. RADIATION DOSE REDUCTION: This exam was performed according to the departmental dose-optimization program which includes automated exposure control, adjustment of the mA and/or kV according to patient size and/or use of iterative reconstruction technique. COMPARISON:  Apr 24, 2024 FINDINGS: Alignment: There is straightening of the normal cervical spine lordosis. Stable, chronic approximately 1 mm to  2 mm  retrolisthesis of the C4 vertebral body is noted on C5 with 1 mm to 2 mm anterolisthesis of C5 on C6. Skull base and vertebrae: No acute fracture. No primary bone lesion or focal pathologic process. Soft tissues and spinal canal: No prevertebral fluid or swelling. No visible canal hematoma. Disc levels: There is mild anterior osteophyte formation at the level of C3-C4 and C5-C6, with mild to moderate severity endplate sclerosis, anterior osteophyte formation and posterior bony spurring at the levels of C4-C5 and C6-C7. There is moderate to marked severity intervertebral disc space narrowing at C4-C5 with marked severity intervertebral disc space narrowing at C6-C7. Bilateral moderate severity multilevel facet joint hypertrophy is noted. Upper chest: Negative. Other: None. IMPRESSION: 1. No acute cervical spine fracture. 2. Multilevel degenerative changes, most prominent at the levels of C4-C5 and C6-C7. Electronically Signed   By: Suzen Dials M.D.   On: 10/04/2024 15:38   CT Head Wo Contrast Result Date: 10/04/2024 CLINICAL DATA:  Status post fall. EXAM: CT HEAD WITHOUT CONTRAST TECHNIQUE: Contiguous axial images were obtained from the base of the skull through the vertex without intravenous contrast. RADIATION DOSE REDUCTION: This exam was performed according to the departmental dose-optimization program which includes automated exposure control, adjustment of the mA and/or kV according to patient size and/or use of iterative reconstruction technique. COMPARISON:  Apr 24, 2024 FINDINGS: Brain: There is generalized cerebral atrophy with widening of the extra-axial spaces and ventricular dilatation. There are areas of decreased attenuation within the white matter tracts of the supratentorial brain, consistent with microvascular disease changes. Vascular: No hyperdense vessel or unexpected calcification. Skull: Normal. Negative for fracture or focal lesion. Sinuses/Orbits: No acute finding. Other: None.  IMPRESSION: 1. No acute intracranial abnormality. 2. Generalized cerebral atrophy and microvascular disease changes of the supratentorial brain. Electronically Signed   By: Suzen Dials M.D.   On: 10/04/2024 15:33   DG Chest Portable 1 View Result Date: 10/04/2024 CLINICAL DATA:  Status post fall. EXAM: PORTABLE CHEST 1 VIEW COMPARISON:  December 15, 2023 FINDINGS: The heart size and mediastinal contours are within normal limits. Low lung volumes are noted. Both lungs are clear. Radiopaque surgical clips are seen overlying the right upper quadrant. Multilevel degenerative changes are present throughout the thoracic spine. IMPRESSION: Low lung volumes without active cardiopulmonary disease. Electronically Signed   By: Suzen Dials M.D.   On: 10/04/2024 15:31   DG Pelvis Portable Result Date: 10/04/2024 CLINICAL DATA:  Fall. EXAM: DG PORTABLE PELVIS COMPARISON:  12/15/2023. FINDINGS: Pelvis is intact with normal and symmetric sacroiliac joints. No acute fracture or dislocation. No aggressive osseous lesion. Visualized sacral arcuate lines are unremarkable. Unremarkable symphysis pubis. There are mild degenerative changes of bilateral hip joints without significant joint space narrowing. Osteophytosis of the superior acetabulum. No radiopaque foreign bodies. IMPRESSION: No acute osseous abnormality of the pelvis. Electronically Signed   By: Ree Molt M.D.   On: 10/04/2024 15:16    Assessment and Plan:   Atrial fibrillation with RVR - Failed DCCV in 2015 and has been on flecainide  since then. Last EKG with NSR was in January 2025.  She was noted to be in first-degree AV block with PR interval 213 msec.  Prior to arrival, she was on flecainide  75 mg twice daily.  Currently on flecainide  100 mg twice daily and diltiazem drip at 10 mg/h for rate control. - Due to prior EKG with NSR and first-degree AV block in January 2025, high risk for arrhythmias on flecainide . -  Discontinue flecainide .   Start p.o. diltiazem 60 mg every 6 hours and wean off diltiazem drip.  Can attempt outpatient DCCV. - Continue Coumadin  with goal INR between 2 and 3.  DOACs cost prohibitive. - Lenient HR control in the setting of infection, goal HR <120 bpm.  Klebsiella pneumonia UTI - On antibiotics, management per primary team.  AKI/AKI on CKD - Management per primary team.  40 minutes spent in reviewing prior medical records, specialist notes, more than 3 labs, discussion and documentation.  For questions or updates, please contact [facility] Please consult [email or web address] for contact info under Cardiology/STEMI.   Signed, Maddax Palinkas Priya Giovany Cosby, MD 10/08/2024 10:03 AM

## 2024-10-08 NOTE — Progress Notes (Signed)
 PHARMACY - ANTICOAGULATION CONSULT NOTE  Pharmacy Consult for warfarin Indication: atrial fibrillation  Allergies  Allergen Reactions   Penicillins Hives        Patient Measurements: Height: 5' 4 (162.6 cm) Weight: 74.4 kg (164 lb) IBW/kg (Calculated) : 54.7 HEPARIN DW (KG): 70.2  Vital Signs: Temp: 98.4 F (36.9 C) (11/02 0900) Temp Source: Oral (11/02 0900) BP: 97/77 (11/02 0900) Pulse Rate: 79 (11/02 0432)  Labs: Recent Labs    10/06/24 0405 10/06/24 0815 10/07/24 0534 10/08/24 0854  HGB 12.3  --   --   --   HCT 36.9  --   --   --   PLT 263  --   --   --   LABPROT  --  28.2* 26.4* 26.2*  INR  --  2.5* 2.3* 2.3*  CREATININE 1.32*  --  1.37*  --   CKTOTAL 105  --   --   --     Estimated Creatinine Clearance: 35.6 mL/min (A) (by C-G formula based on SCr of 1.37 mg/dL (H)).   Medical History: Past Medical History:  Diagnosis Date   Anxiety    Arthritis    Asthma    Bronchitis   Atherosclerosis 11/25/2017   noted on CT   Back pain    Depression    Diabetes mellitus    Diverticulosis 11/25/2017   mild, noted on CT   Fatty liver 11/25/2017   noted on CT   First degree AV block    History of kidney stones    Hydronephrosis 11/25/2017   mild left noted on CT   Hyperlipidemia    Hypertension    PAF (paroxysmal atrial fibrillation) (HCC)    Renal cyst 11/2017   Left, noted on US    Right renal artery stenosis    Sinus bradycardia    Skin cancer    questionable skin cancer on face years ago   UTI (urinary tract infection)    Medications:  Scheduled:   allopurinol   50 mg Oral Daily   aspirin  EC  81 mg Oral Daily   cefadroxil  500 mg Oral BID   diltiazem  60 mg Oral QID   DULoxetine   60 mg Oral Daily   glipiZIDE  2.5 mg Oral QAC breakfast   Influenza vac split trivalent PF  0.5 mL Intramuscular Tomorrow-1000   insulin  aspart  0-5 Units Subcutaneous QHS   insulin  aspart  0-9 Units Subcutaneous TID WC   [START ON 10/09/2024] losartan  12.5 mg  Oral Daily   pneumococcal 20-valent conjugate vaccine  0.5 mL Intramuscular Tomorrow-1000   Warfarin - Pharmacist Dosing Inpatient   Does not apply q1600   zolpidem   5 mg Oral QHS   Infusions:    Assessment: Patient is a 55 YOF presenting to the ED in setting of a fall on 10/27 and unable to get up since. Patient with history of atrial fibrillation anticoagulated PTA with warfarin. Pharmacy has been consulted to dose warfarin for afib.   Initial INR 2.3. Patient's home regimen is warfarin 5mg  tablets; take 1 tablet Mondays and Wednesdays and take 1/2 tablet all other days as confirmed by patient. Patient reports has not taken a dose of warfarin yet today. Hgb and plt WNL.   Of note, patient reports she is taking warfarin instead of a DOAC for afib solely due to cost/insurance reasons, but states her insurance has recently changed and should now cover Eliquis. She would like to transition to Eliquis if insurance would cover  it.   INR 2.3 - continue warfarin dosing  Goal of Therapy:  INR 2-3 Monitor platelets by anticoagulation protocol: Yes   Plan:  -Give warfarin 2.5 mg po x1 tonight.  -Monitor CBC, INR daily.  Eliquis copay is $171.12- continue warfarin  R. Samual Satterfield, PharmD PGY-1 Acute Care Pharmacy Resident [facility] Please refer to [facility] for [facility] numbers 10/08/2024 10:18 AM

## 2024-10-08 NOTE — Progress Notes (Signed)
 Echocardiogram 2D Echocardiogram has been performed.  Peggy Chen 10/08/2024, 10:00 AM

## 2024-10-09 DIAGNOSIS — I4819 Other persistent atrial fibrillation: Secondary | ICD-10-CM

## 2024-10-09 DIAGNOSIS — I10 Essential (primary) hypertension: Secondary | ICD-10-CM | POA: Diagnosis not present

## 2024-10-09 DIAGNOSIS — E785 Hyperlipidemia, unspecified: Secondary | ICD-10-CM | POA: Diagnosis not present

## 2024-10-09 DIAGNOSIS — I340 Nonrheumatic mitral (valve) insufficiency: Secondary | ICD-10-CM | POA: Diagnosis not present

## 2024-10-09 DIAGNOSIS — T796XXA Traumatic ischemia of muscle, initial encounter: Secondary | ICD-10-CM | POA: Diagnosis not present

## 2024-10-09 LAB — BASIC METABOLIC PANEL WITH GFR
Anion gap: 12 (ref 5–15)
Anion gap: 11 (ref 5–15)
BUN: 24 mg/dL — ABNORMAL HIGH (ref 8–23)
BUN: 35 mg/dL — ABNORMAL HIGH (ref 8–23)
CO2: 24 mmol/L (ref 22–32)
CO2: 23 mmol/L (ref 22–32)
Calcium: 8.5 mg/dL — ABNORMAL LOW (ref 8.9–10.3)
Calcium: 8.2 mg/dL — ABNORMAL LOW (ref 8.9–10.3)
Chloride: 101 mmol/L (ref 98–111)
Chloride: 97 mmol/L — ABNORMAL LOW (ref 98–111)
Creatinine, Ser: 1.23 mg/dL — ABNORMAL HIGH (ref 0.44–1.00)
Creatinine, Ser: 1.51 mg/dL — ABNORMAL HIGH (ref 0.44–1.00)
GFR, Estimated: 46 mL/min — ABNORMAL LOW (ref >60)
GFR, Estimated: 36 mL/min — ABNORMAL LOW (ref >60)
Glucose, Bld: 91 mg/dL (ref 70–99)
Glucose, Bld: 397 mg/dL — ABNORMAL HIGH (ref 70–99)
Potassium: 3.3 mmol/L — ABNORMAL LOW (ref 3.5–5.1)
Potassium: 4.6 mmol/L (ref 3.5–5.1)
Sodium: 137 mmol/L (ref 135–145)
Sodium: 131 mmol/L — ABNORMAL LOW (ref 135–145)

## 2024-10-09 LAB — GLUCOSE, CAPILLARY
Glucose-Capillary: 156 mg/dL — ABNORMAL HIGH (ref 70–99)
Glucose-Capillary: 180 mg/dL — ABNORMAL HIGH (ref 70–99)
Glucose-Capillary: 175 mg/dL — ABNORMAL HIGH (ref 70–99)
Glucose-Capillary: 429 mg/dL — ABNORMAL HIGH (ref 70–99)
Glucose-Capillary: 356 mg/dL — ABNORMAL HIGH (ref 70–99)

## 2024-10-09 LAB — PROTIME-INR
INR: 2.4 — ABNORMAL HIGH (ref 0.8–1.2)
Prothrombin Time: 26.9 s — ABNORMAL HIGH (ref 11.4–15.2)

## 2024-10-09 LAB — MAGNESIUM: Magnesium: 2.2 mg/dL (ref 1.7–2.4)

## 2024-10-09 MED ORDER — WARFARIN SODIUM 5 MG PO TABS
5.0000 mg | ORAL_TABLET | Freq: Once | ORAL | Status: AC
  Administered 2024-10-09: 5 mg via ORAL
  Filled 2024-10-09: qty 1

## 2024-10-09 MED ORDER — DILTIAZEM HCL ER COATED BEADS 240 MG PO CP24
240.0000 mg | ORAL_CAPSULE | Freq: Every day | ORAL | Status: DC
  Administered 2024-10-09 – 2024-10-10 (×2): 240 mg via ORAL
  Filled 2024-10-09 (×2): qty 1

## 2024-10-09 MED ORDER — POTASSIUM CHLORIDE CRYS ER 20 MEQ PO TBCR
40.0000 meq | EXTENDED_RELEASE_TABLET | Freq: Two times a day (BID) | ORAL | Status: AC
  Administered 2024-10-09 (×2): 40 meq via ORAL
  Filled 2024-10-09 (×2): qty 2

## 2024-10-09 MED ORDER — METHYLPREDNISOLONE ACETATE 40 MG/ML IJ SUSP
40.0000 mg | Freq: Once | INTRAMUSCULAR | Status: AC
  Administered 2024-10-09: 40 mg via INTRA_ARTICULAR
  Filled 2024-10-09: qty 1

## 2024-10-09 MED ORDER — INSULIN ASPART 100 UNIT/ML IJ SOLN
8.0000 [IU] | Freq: Once | INTRAMUSCULAR | Status: AC
  Administered 2024-10-09: 8 [IU] via SUBCUTANEOUS

## 2024-10-09 MED ORDER — BUPIVACAINE HCL (PF) 0.5 % IJ SOLN
10.0000 mL | Freq: Once | INTRAMUSCULAR | Status: AC
  Administered 2024-10-09: 10 mL
  Filled 2024-10-09: qty 10

## 2024-10-09 NOTE — Progress Notes (Signed)
 Physical Therapy Treatment Patient Details Name: Peggy Chen MRN: [id number] DOB: 24-Feb-1951 Today's Date: 10/09/2024   History of Present Illness 73 y.o. female [facility]04/24 after falling with pt on the ground for 2 days, unable to get up. Pt with rhabdomyolysis, UTI, and AKI. PMHx: DM2, HTN, HLD, paroxysmal A-fib on Coumadin , sinus bradycardia, first-degree AV block, arthritis, impaired mobility, diverticulosis, anxiety, depression    PT Comments  The pt was agreeable to session and able to make good progress with OOB mobility and stability. Pt needing minA to manage sit-stand transfers due to persistent posterior lean with initial stand, but is able to correct with cues and increased time. Pt able to manage x3 standing bouts, progressing to ~18 ft ambulation with minA and use of RW. Will continue to follow acutely and progress as able, recommendations remain appropriate at this time.     If plan is discharge home, recommend the following: A lot of help with bathing/dressing/bathroom;Assistance with cooking/housework;Assist for transportation;Help with stairs or ramp for entrance;Two people to help with walking and/or transfers;Supervision due to cognitive status;Direct supervision/assist for medications management;Direct supervision/assist for financial management   Can travel by private vehicle     No  Equipment Recommendations  Rolling walker (2 wheels);BSC/3in1;Wheelchair (measurements PT);Wheelchair cushion (measurements PT)    Recommendations for Other Services       Precautions / Restrictions Precautions Precautions: Fall Recall of Precautions/Restrictions: Impaired Restrictions Weight Bearing Restrictions Per Provider Order: No     Mobility  Bed Mobility Overal bed mobility: Needs Assistance Bed Mobility: Sit to Supine, Supine to Sit     Supine to sit: Contact guard, Used rails Sit to supine: Supervision   General bed mobility comments: pt completed x3  in session as she impulsively returned to supine to let her back stretch out no assist given, cues to scoot to EOB    Transfers Overall transfer level: Needs assistance Equipment used: Rolling walker (2 wheels), 2 person hand held assist Transfers: Sit to/from Stand Sit to Stand: Mod assist, +2 physical assistance, Min assist           General transfer comment: progressed from modA of 2 without RW to minA with RW, maintains posterior lean until cued for hip extension and posture    Ambulation/Gait Ambulation/Gait assistance: Min assist, +2 safety/equipment Gait Distance (Feet): 5 Feet (+ 18 ft) Assistive device: Rolling walker (2 wheels) Gait Pattern/deviations: Step-through pattern, Decreased stride length, Decreased weight shift to left Gait velocity: decreased     General Gait Details: pt with small steps, limited wt on LLE due to pain in L knee. no overt buckling, HR max 101bpm     Balance Overall balance assessment: Needs assistance Sitting-balance support: No upper extremity supported, Feet supported Sitting balance-Leahy Scale: Good Sitting balance - Comments: CGA with static sitting Postural control: Posterior lean Standing balance support: Bilateral upper extremity supported, During functional activity, Reliant on assistive device for balance Standing balance-Leahy Scale: Poor Standing balance comment: BUE support with pt with strong posterior lean initially, able to correct with cues and assist                            Communication Communication Communication: No apparent difficulties  Cognition Arousal: Alert Behavior During Therapy: Flat affect   PT - Cognitive impairments: Problem solving, Awareness, Safety/Judgement  PT - Cognition Comments: pt demos improved initiation, cues for safety awareness and insight to deficits Following commands: Impaired Following commands impaired: Follows one step commands with  increased time, Follows multi-step commands inconsistently    Cueing Cueing Techniques: Verbal cues  Exercises      General Comments General comments (skin integrity, edema, etc.): VSS on RA      Pertinent Vitals/Pain Pain Assessment Pain Assessment: Faces Faces Pain Scale: Hurts even more Pain Location: L knee and low back Pain Descriptors / Indicators: Cramping, Discomfort, Grimacing, Guarding Pain Intervention(s): Limited activity within patient's tolerance, Monitored during session, Repositioned     PT Goals (current goals can now be found in the care plan section) Acute Rehab PT Goals Patient Stated Goal: to get stronger PT Goal Formulation: With patient Time For Goal Achievement: 10/19/24 Potential to Achieve Goals: Good Progress towards PT goals: Progressing toward goals    Frequency    Min 2X/week       AM-PAC PT 6 Clicks Mobility   Outcome Measure  Help needed turning from your back to your side while in a flat bed without using bedrails?: None Help needed moving from lying on your back to sitting on the side of a flat bed without using bedrails?: None Help needed moving to and from a bed to a chair (including a wheelchair)?: A Little Help needed standing up from a chair using your arms (e.g., wheelchair or bedside chair)?: A Little Help needed to walk in hospital room?: A Lot Help needed climbing 3-5 steps with a railing? : Total 6 Click Score: 17    End of Session Equipment Utilized During Treatment: Gait belt Activity Tolerance: Patient tolerated treatment well Patient left: with call bell/phone within reach;in bed;with bed alarm set;with family/visitor present Nurse Communication: Mobility status (pt request for Voltaren gel for knees) PT Visit Diagnosis: Unsteadiness on feet (R26.81);Other abnormalities of gait and mobility (R26.89);Muscle weakness (generalized) (M62.81);History of falling (Z91.81)     Time: 8555-8484 PT Time Calculation (min)  (ACUTE ONLY): 31 min  Charges:    $Gait Training: 8-22 mins $Therapeutic Exercise: 8-22 mins PT General Charges $$ ACUTE PT VISIT: 1 Visit                     Izetta Call, PT, DPT   Acute Rehabilitation Department Office [phone] Secure Chat Communication Preferred   Izetta JULIANNA Call 10/09/2024, 3:44 PM

## 2024-10-09 NOTE — Progress Notes (Signed)
 PROGRESS NOTE  Peggy Chen  DOB: 1951-03-30  PCP: Avva, Ravisankar, MD FMW:[id number]  DOA: 10/04/2024  LOS: 1 day  Hospital Day: 6  Subjective: Patient was seen and examined this morning. Lying down in bed.  Continues to have left hip pain. Afebrile, heart rate in 70s still in A-fib, blood pressure in the 120s, breathing room air Labs from this morning with potassium low at 3.3, creatinine 1.23, INR 2.4  Brief narrative: Peggy Chen is a 72 y.o. female with PMH significant for DM2, HTN, HLD, paroxysmal A-fib on Coumadin , sinus bradycardia, first-degree AV block, arthritis, impaired mobility, diverticulosis, anxiety, depression Lives at home.  Has difficulty with ambulation.  At baseline, uses a walker.  Patient had a fall on the evening of Monday 10/27. It sounds like her walker hit something while she was going to the bathroom.  She fell to the ground, did not hit her head, did not pass out, was too weak to get up on her own.  She spent most of the time crawling around the floor.  Not having heard from her for 2 days, a friend of her called welfare check.  EMS found on the floor, complaining of left hip pain and brought to the ED.  Workup in ED did not show any fracture, showed AKI, rhabdomyolysis, UTI Was also in A-fib with RVR and started on Cardizem drip Admitted to [facility]  Assessment/Plan: Klebsiella pneumoniae UTI Likely cause of weakness and fall  Urinalysis showed cloudy yellow urine with moderate hemoglobin, moderate leukocytes, positive nitrite and many bacteria Urine culture grew more than 100,000 CFU per mL of pansensitive Klebsiella pneumoniae Initially treated with IV Rocephin . 11/2, switched cefadroxil oral to complete 7-day course. Recent Labs  Lab 10/04/24 1436 10/05/24 1040 10/06/24 0405  WBC 17.2* 16.5* 11.9*   AKI on CKD 3B Rhabdomyolysis Was on the floor for 2 days, unable to get up Looked dehydrated on presentation. Initial CK was elevated to  over 400.   Baseline creatinine less than 1.4.  Presented with creatinine elevated to 1.69 With IV hydration, CK and creatinine improved. Recent Labs    10/31/23 0247 11/01/23 0233 11/02/23 0255 12/15/23 2030 04/24/24 1900 10/04/24 1436 10/05/24 0549 10/06/24 0405 10/07/24 0534 10/09/24 0530  BUN 42* 36* 29* 21 33* 38* 37* 25* 24* 24*  CREATININE 2.04* 1.54* 1.52* 1.44* 1.36* 1.69* 1.43* 1.32* 1.37* 1.23*  CO2 24 24 25 25 22  21* 22 22 24 24    A-fib with RVR  paroxysmal A-fib H/o first-degree AV block PTA meds-Flecainide  75 mg twice daily but missed it for 2 days at least after the fall. This hospitalization, patient has been consistently in A-fib and intermittently in RVR.   Has been off and on Cardizem drip. 11/2, cardiology was consulted.  Blood thinner was stopped.  Switched from Cardizem drip to Cardizem CD 240 mg oral daily Regarding anticoagulation, patient has been chronically anticoagulated with Coumadin .   INR therapeutic between 2 and 3.  Pharmacy following. Patient states she was on Eliquis in the past but had to switch to Coumadin  few years ago because of the cost.  Her Eliquis co-pay is still high at $171.  Hence patient is not yet willing to switch to Eliquis. Recent Labs  Lab 10/05/24 0549 10/06/24 0815 10/07/24 0534 10/08/24 0854 10/09/24 0530  INR 2.4* 2.5* 2.3* 2.3* 2.4*    Prolonged QTc Initial EKG showed QTc prolonged at 562 ms Repeat EKG 10/30 showed QTc improved to 439 ms Monitor electrolytes, target  K>4, Mg>2  Hypokalemia/hypomagnesemia Potassium level low at 3.3 this morning.  Started on replacement. Recent Labs  Lab 10/04/24 1436 10/05/24 0549 10/06/24 0405 10/07/24 0534 10/08/24 1220 10/09/24 0530  K 3.3* 3.3* 3.3* 3.6  --  3.3*  MG  --  1.4*  --   --  1.5* 2.2  PHOS  --  3.1  --   --   --   --    Type 2 diabetes mellitus A1c 6.1 on 09/25/2024  PTA meds-none. Because of persistent elevated blood sugar level, glipizide 2.5 mL daily  was added on 11/1. Continue to monitor Recent Labs  Lab 10/08/24 1224 10/08/24 1642 10/08/24 2148 10/09/24 0758 10/09/24 1136  GLUCAP 184* 105* 167* 156* 180*   HTN Blood pressure better with losartan 12.5 mg daily  HLD Continue Whelchol  Diverticulosis Chronic anemia H&H in normal range. Recent Labs    12/15/23 2030 04/24/24 1900 10/04/24 1436 10/05/24 1040 10/06/24 0405  HGB 13.7 13.3 15.4* 12.9 12.3  MCV 88.9 92.0 91.1 91.5 91.3   Anxiety/depression Continue Cymbalta , nightly Ambien   Left knee pain  Left foot pain Chronic arthritic pain Patient has chronic arthritic pain with mobility impairment, uses walker at home and was on Norco as needed.   11/1, left foot x-ray showed degenerative changes as well as findings suggestive of gout. Given colchicine 1.2 mg followed by 0.6 mg.  Feels much better this morning 11/1, left knee x-ray showed severe tricompartmental osteoarthritis with large suprapatellar joint effusion likely reactive.  On exam, left knee is warm, swollen and tender. Orthopedics was consulted.  Orthopedics aspirated left knee and also injected Depo-Medrol /Marcaine with significant pain relief  Fall Impaired mobility It seems the fall was mechanical in nature.  Likely confounded by weakness from UTI. Imagings negative for fracture PT eval obtained.  SNF recommended  Mobility:  PT Orders:   PT Follow up Rec: Skilled Nursing-Short Term Rehab (<3 Hours/Day)10/07/2024 1600   Goals of care:   Code Status: Full Code.  Confirmed with patient   DVT prophylaxis:   warfarin (COUMADIN ) tablet 5 mg   Antimicrobials: None Fluid: Not on IV fluid Consultants: Cardiology, Family Communication: Family not at bedside  Status: Observation Level of care:  Telemetry   Patient is from: Home Anticipated d/c to: Pending clinical course, needs SNF ultimately after clearance by cardiology  Diet:  Diet Order             Diet regular Room service  appropriate? Yes; Fluid consistency: Thin  Diet effective now                    Scheduled Meds:  allopurinol   50 mg Oral Daily   cefadroxil  500 mg Oral BID   diltiazem  240 mg Oral Daily   DULoxetine   60 mg Oral Daily   glipiZIDE  2.5 mg Oral QAC breakfast   Influenza vac split trivalent PF  0.5 mL Intramuscular Tomorrow-1000   insulin  aspart  0-5 Units Subcutaneous QHS   insulin  aspart  0-9 Units Subcutaneous TID WC   losartan  12.5 mg Oral Daily   pneumococcal 20-valent conjugate vaccine  0.5 mL Intramuscular Tomorrow-1000   potassium chloride   40 mEq Oral BID   warfarin  5 mg Oral ONCE-1600   Warfarin - Pharmacist Dosing Inpatient   Does not apply q1600   zolpidem   5 mg Oral QHS    PRN meds: acetaminophen  **OR** acetaminophen , albuterol , bisacodyl, colesevelam, hydrALAZINE, HYDROcodone -acetaminophen , HYDROmorphone (DILAUDID) injection, polyethylene glycol  Infusions:     Antimicrobials: Anti-infectives (From admission, onward)    Start     Dose/Rate Route Frequency Ordered Stop   10/08/24 1000  cefadroxil (DURICEF) capsule 500 mg        500 mg Oral 2 times daily 10/08/24 0834 10/10/24 0959   10/05/24 2200  cefTRIAXone  (ROCEPHIN ) 1 g in sodium chloride  0.9 % 100 mL IVPB  Status:  Discontinued        1 g 200 mL/hr over 30 Minutes Intravenous Every 24 hours 10/05/24 1513 10/08/24 0834   10/04/24 2115  cefTRIAXone  (ROCEPHIN ) 2 g in sodium chloride  0.9 % 100 mL IVPB  Status:  Discontinued        2 g 200 mL/hr over 30 Minutes Intravenous  Once 10/04/24 2106 10/04/24 2108   10/04/24 2115  cefTRIAXone  (ROCEPHIN ) 1 g in sodium chloride  0.9 % 100 mL IVPB        1 g 200 mL/hr over 30 Minutes Intravenous  Once 10/04/24 2108 10/04/24 2359       Objective: Vitals:   10/09/24 0527 10/09/24 0839  BP: 108/62 129/66  Pulse: 78 90  Resp: 19 16  Temp: 97.9 F (36.6 C) 97.8 F (36.6 C)  SpO2: 100% 97%    Intake/Output Summary (Last 24 hours) at 10/09/2024 1340 Last  data filed at 10/09/2024 0844 Gross per 24 hour  Intake 343.87 ml  Output 1150 ml  Net -806.13 ml   Filed Weights   10/04/24 1429  Weight: 74.4 kg   Weight change:  Body mass index is 28.15 kg/m.   Physical Exam: General exam: Pleasant, elderly Caucasian female.  Not in distress. Skin: No rashes, lesions or ulcers. HEENT: Atraumatic, normocephalic, no obvious bleeding Lungs: Clear to auscultation bilaterally CVS: Rate controlled irregular rhythm, no murmur GI/Abd: Soft, nontender, nondistended, bowel sound present,   CNS: Alert, awake, oriented x 3. Psychiatry: Mood appropriate. Extremities: No pedal edema, no calf tenderness, left knee warm, swollen and tender this morning prior to arthrocentesis  Data Review: I have personally reviewed the laboratory data and studies available.  F/u labs ordered Unresulted Labs (From admission, onward)     Start     Ordered   10/10/24 0500  Lipid panel  Tomorrow morning,   R       Question:  Specimen collection method  Answer:  Lab=Lab collect   10/09/24 0914   10/09/24 1135  Synovial cell count + diff, w/ crystals  Once,   R        10/09/24 1134   10/09/24 0500  Basic metabolic panel  Daily,   R     Question:  Specimen collection method  Answer:  Lab=Lab collect   10/08/24 1038   10/07/24 0500  Protime-INR  Daily,   R      10/06/24 0741            Signed, Chapman Rota, MD [facility] 10/09/2024

## 2024-10-09 NOTE — Procedures (Signed)
 Procedure: Left knee aspiration and injection   Indication: Left knee effusion(s)   Surgeon: Ozell Ned, PA-C   Assist: None   Anesthesia: Topical refrigerant   EBL: None   Complications: None   Findings: After risks/benefits explained patient desires to undergo procedure. Consent obtained and time out performed. The left knee was sterilely prepped and aspirated. 5ml clear yellow fluid obtained. 40mg  depo-medrol  and 6ml 0.5% Marcaine instilled. Pt tolerated the procedure well.       Ozell DOROTHA Ned, PA-C Orthopedic Surgery [phone]

## 2024-10-09 NOTE — TOC Progression Note (Addendum)
 Transition of Care [facility]) - Progression Note    Patient Details  Name: Peggy Chen MRN: [id number] Date of Birth: August 30, 1951  [facility] CM/SW Contact  Isaiah Public, LCSWA Phone Number: 10/09/2024, 1:31 PM  Clinical Narrative:     CSW spoke with patient at bedside. CSW provided patient with SNF bed offers. Patient accepted SNF bed offer with [name]. All questions answered. No further questions reported at this time. CSW confirmed SNF bed with [facility] in Admissions with [name]. CSW following to start insurance authorization for patient. CSW will continue to follow.  Update- CSW requested for Jon CMA to start insurance authorization for patient.  Update- Jon CMA informed CSW that patients insurance authorization is currently pending.  Mayme Barrows PI#[id number].  Expected Discharge Plan: Skilled Nursing Facility Barriers to Discharge: Continued Medical Work up, English [employer] Language [employer], SNF Pending bed offer               Expected Discharge Plan and Services       Living arrangements for the past 2 months: Skilled Nursing Facility                                       Social Drivers of Health (SDOH) Interventions SDOH Screenings   Food Insecurity: No Food Insecurity (10/05/2024)  Housing: Low Risk  (10/05/2024)  Transportation Needs: No Transportation Needs (10/05/2024)  Utilities: Not At Risk (10/05/2024)  Social Connections: Moderately Integrated (10/05/2024)  Tobacco Use: Medium Risk (10/04/2024)    Readmission Risk Interventions     No data to display

## 2024-10-09 NOTE — Consult Note (Signed)
 Reason for Consult:Left knee pain Referring Physician: Chapman Dahal Time called: 1109 Time at bedside: 1125   Peggy Chen is an 73 y.o. female.  HPI: Peggy Chen was admitted 4d ago for weakness and afib w/RVR following a fall. She notes moderately severe left knee pain for the past 2 weeks, especially worse over the weekend. She describes prior e/o knee pain that have come and gone in that same knee and has seen some provider(s) at [facility] for that.  Past Medical History:  Diagnosis Date   Anxiety    Arthritis    Asthma    Bronchitis   Atherosclerosis 11/25/2017   noted on CT   Back pain    Depression    Diabetes mellitus    Diverticulosis 11/25/2017   mild, noted on CT   Fatty liver 11/25/2017   noted on CT   First degree AV block    History of kidney stones    Hydronephrosis 11/25/2017   mild left noted on CT   Hyperlipidemia    Hypertension    PAF (paroxysmal atrial fibrillation) (HCC)    Renal cyst 11/2017   Left, noted on US    Right renal artery stenosis    Sinus bradycardia    Skin cancer    questionable skin cancer on face years ago   UTI (urinary tract infection)     Past Surgical History:  Procedure Laterality Date   BREAST CYST REMOVAL     RIGHT BREAST   CARDIOVERSION N/A 04/17/2014   Procedure: CARDIOVERSION;  Surgeon: Aleene JINNY Passe, MD;  Location: [facility] ENDOSCOPY;  Service: Cardiovascular;  Laterality: N/A;   CARDIOVERSION  05/2014   attempted DC failed   COLON SURGERY     May 2000   COLONOSCOPY     CYSTOSCOPY/URETEROSCOPY/HOLMIUM LASER/STENT PLACEMENT Left 11/26/2017   Procedure: CYSTOSCOPY/STENT PLACEMENT;  Surgeon: Renda Glance, MD;  Location: [facility] ORS;  Service: Urology;  Laterality: Left;   CYSTOSCOPY/URETEROSCOPY/HOLMIUM LASER/STENT PLACEMENT Left 12/23/2017   Procedure: CYSTOSCOPY/ LEFT URETEROSCOPY;  Surgeon: Renda Glance, MD;  Location: [facility] ORS;  Service: Urology;  Laterality: Left;   TONSILLECTOMY     US  ECHOCARDIOGRAPHY  12/30/2009   EF  55-60%    Family History  Problem Relation Age of Onset   COPD Mother    Heart attack Father     Social History:  reports that she quit smoking about 38 years ago. Her smoking use included cigarettes. She has never used smokeless tobacco. She reports current alcohol use. She reports that she does not use drugs.  Allergies:  Allergies  Allergen Reactions   Penicillins Hives         Medications: I have reviewed the patient's current medications.  Results for orders placed or performed during the hospital encounter of 10/04/24 (from the past 48 hours)  Glucose, capillary     Status: Abnormal   Collection Time: 10/07/24  3:41 PM  Result Value Ref Range   Glucose-Capillary 162 (H) 70 - 99 mg/dL    Comment: Glucose reference range applies only to samples taken after fasting for at least 8 hours.   Comment 1 Notify RN   Glucose, capillary     Status: Abnormal   Collection Time: 10/07/24  5:39 PM  Result Value Ref Range   Glucose-Capillary 182 (H) 70 - 99 mg/dL    Comment: Glucose reference range applies only to samples taken after fasting for at least 8 hours.  Glucose, capillary     Status: Abnormal   Collection Time:  10/07/24  8:47 PM  Result Value Ref Range   Glucose-Capillary 201 (H) 70 - 99 mg/dL    Comment: Glucose reference range applies only to samples taken after fasting for at least 8 hours.  Glucose, capillary     Status: Abnormal   Collection Time: 10/08/24  8:41 AM  Result Value Ref Range   Glucose-Capillary 126 (H) 70 - 99 mg/dL    Comment: Glucose reference range applies only to samples taken after fasting for at least 8 hours.  Protime-INR     Status: Abnormal   Collection Time: 10/08/24  8:54 AM  Result Value Ref Range   Prothrombin Time 26.2 (H) 11.4 - 15.2 seconds   INR 2.3 (H) 0.8 - 1.2    Comment: (NOTE) INR goal varies based on device and disease states. Performed at [facility], [address], [place], [place] [address]   Magnesium       Status: Abnormal   Collection Time: 10/08/24 12:20 PM  Result Value Ref Range   Magnesium  1.5 (L) 1.7 - 2.4 mg/dL    Comment: Performed at [facility], [address], [place], [place] [address]  Glucose, capillary     Status: Abnormal   Collection Time: 10/08/24 12:24 PM  Result Value Ref Range   Glucose-Capillary 184 (H) 70 - 99 mg/dL    Comment: Glucose reference range applies only to samples taken after fasting for at least 8 hours.  Glucose, capillary     Status: Abnormal   Collection Time: 10/08/24  4:42 PM  Result Value Ref Range   Glucose-Capillary 105 (H) 70 - 99 mg/dL    Comment: Glucose reference range applies only to samples taken after fasting for at least 8 hours.  Glucose, capillary     Status: Abnormal   Collection Time: 10/08/24  9:48 PM  Result Value Ref Range   Glucose-Capillary 167 (H) 70 - 99 mg/dL    Comment: Glucose reference range applies only to samples taken after fasting for at least 8 hours.  Protime-INR     Status: Abnormal   Collection Time: 10/09/24  5:30 AM  Result Value Ref Range   Prothrombin Time 26.9 (H) 11.4 - 15.2 seconds   INR 2.4 (H) 0.8 - 1.2    Comment: (NOTE) INR goal varies based on device and disease states. Performed at [facility], [address] [place], [place] [address]   Basic metabolic panel     Status: Abnormal   Collection Time: 10/09/24  5:30 AM  Result Value Ref Range   Sodium 137 135 - 145 mmol/L   Potassium 3.3 (L) 3.5 - 5.1 mmol/L   Chloride 101 98 - 111 mmol/L   CO2 24 22 - 32 mmol/L   Glucose, Bld 91 70 - 99 mg/dL    Comment: Glucose reference range applies only to samples taken after fasting for at least 8 hours.   BUN 24 (H) 8 - 23 mg/dL   Creatinine, Ser 8.76 (H) 0.44 - 1.00 mg/dL   Calcium 8.5 (L) 8.9 - 10.3 mg/dL   GFR, Estimated 46 (L) >60 mL/min    Comment: (NOTE) Calculated using the CKD-EPI Creatinine Equation (2021)    Anion gap 12 5 - 15    Comment: Performed at [facility], [address], [place], [place] [address]  Magnesium      Status: None   Collection Time: 10/09/24  5:30 AM  Result Value Ref Range   Magnesium   2.2 1.7 - 2.4 mg/dL    Comment: Performed at [facility], [address] [place], [place] [address]  Glucose, capillary     Status: Abnormal   Collection Time: 10/09/24  7:58 AM  Result Value Ref Range   Glucose-Capillary 156 (H) 70 - 99 mg/dL    Comment: Glucose reference range applies only to samples taken after fasting for at least 8 hours.    ECHOCARDIOGRAM COMPLETE Result Date: 10/08/2024    ECHOCARDIOGRAM REPORT   Patient Name:   Peggy Chen Date of Exam: 10/08/2024 Medical Rec #:  [id number]       Height:       64.0 in Accession #:    [id number]      Weight:       164.0 lb Date of Birth:  [birth year]      BSA:          1.798 m Patient Age:    72 years        BP:           117/60 mmHg Patient Gender: F               HR:           80 bpm. Exam Location:  Inpatient Procedure: 2D Echo, Cardiac Doppler and Color Doppler (Both Spectral and Color            Flow Doppler were utilized during procedure). Indications:    Abnormal ECG 794.31 / R94.31  History:        Patient has prior history of Echocardiogram examinations, most                 recent 04/05/2014. Arrythmias:Atrial Fibrillation; Risk                 Factors:Hypertension, Diabetes and Dyslipidemia.  Sonographer:    Thea Norlander RCS Referring Phys: BINAYA DAHAL IMPRESSIONS  1. Left ventricular ejection fraction, by estimation, is 60 to 65%. The left ventricle has normal function. The left ventricle has no regional wall motion abnormalities. Left ventricular diastolic parameters are indeterminate.  2. Right ventricular systolic function is normal. The right ventricular size is mildly enlarged.  3. Left atrial size was moderately dilated.  4. The mitral valve is normal in structure. Moderate mitral valve regurgitation. No evidence of mitral stenosis.  5. The aortic valve is normal in  structure. Aortic valve regurgitation is not visualized. No aortic stenosis is present.  6. The inferior vena cava is normal in size with greater than 50% respiratory variability, suggesting right atrial pressure of 3 mmHg. FINDINGS  Left Ventricle: Left ventricular ejection fraction, by estimation, is 60 to 65%. The left ventricle has normal function. The left ventricle has no regional wall motion abnormalities. The left ventricular internal cavity size was normal in size. There is  no left ventricular hypertrophy. Left ventricular diastolic parameters are indeterminate. Right Ventricle: The right ventricular size is mildly enlarged. No increase in right ventricular wall thickness. Right ventricular systolic function is normal. Left Atrium: Left atrial size was moderately dilated. Right Atrium: Right atrial size was normal in size. Pericardium: There is no evidence of pericardial effusion. Mitral Valve: The mitral valve is normal in structure. Mild mitral annular calcification. Moderate mitral valve regurgitation. No evidence of mitral valve stenosis. Tricuspid Valve: The tricuspid valve is normal in structure. Tricuspid valve regurgitation is trivial. No evidence of tricuspid stenosis. Aortic Valve: The aortic valve is normal in structure. Aortic valve  regurgitation is not visualized. No aortic stenosis is present. Aortic valve peak gradient measures 10.1 mmHg. Pulmonic Valve: The pulmonic valve was normal in structure. Pulmonic valve regurgitation is not visualized. No evidence of pulmonic stenosis. Aorta: The aortic root is normal in size and structure. Venous: The inferior vena cava is normal in size with greater than 50% respiratory variability, suggesting right atrial pressure of 3 mmHg. IAS/Shunts: No atrial level shunt detected by color flow Doppler.  LEFT VENTRICLE PLAX 2D LVIDd:         4.80 cm   Diastology LVIDs:         3.30 cm   LV e' medial:    10.70 cm/s LV PW:         1.00 cm   LV E/e' medial:  11.4  LV IVS:        1.10 cm   LV e' lateral:   10.60 cm/s LVOT diam:     1.90 cm   LV E/e' lateral: 11.5 LV SV:         45 LV SV Index:   25 LVOT Area:     2.84 cm  RIGHT VENTRICLE             IVC RV S prime:     15.50 cm/s  IVC diam: 2.60 cm TAPSE (M-mode): 2.3 cm LEFT ATRIUM           Index        RIGHT ATRIUM           Index LA diam:      4.40 cm 2.45 cm/m   RA Area:     16.70 cm LA Vol (A2C): 55.0 ml 30.59 ml/m  RA Volume:   41.40 ml  23.02 ml/m LA Vol (A4C): 87.7 ml 48.77 ml/m  AORTIC VALVE AV Area (Vmax): 1.64 cm AV Vmax:        159.00 cm/s AV Peak Grad:   10.1 mmHg LVOT Vmax:      92.22 cm/s LVOT Vmean:     60.600 cm/s LVOT VTI:       0.157 m  AORTA Ao Root diam: 3.30 cm Ao Asc diam:  3.60 cm MITRAL VALVE                TRICUSPID VALVE MV Area (PHT): 4.80 cm     TR Peak grad:   240100.0 mmHg MV Decel Time: 158 msec     TR Vmax:        24500.00 cm/s MV E velocity: 122.00 cm/s MV A velocity: 32.00 cm/s   SHUNTS MV E/A ratio:  3.81         Systemic VTI:  0.16 m                             Systemic Diam: 1.90 cm Oneil Parchment MD Electronically signed by Oneil Parchment MD Signature Date/Time: 10/08/2024/12:43:54 PM    Final    DG Knee 3 Views Left Result Date: 10/07/2024 CLINICAL DATA:  Left knee pain EXAM: LEFT KNEE - 3 VIEW COMPARISON:  None Available. FINDINGS: Frontal, oblique, and cross-table lateral views of the left knee are obtained. No fracture, subluxation, or dislocation. There is severe 3 compartmental osteoarthritis with marked joint space narrowing and osteophyte formation in all 3 compartments. Large suprapatellar joint effusion is likely reactive. Remaining soft tissues are unremarkable. IMPRESSION: 1. Severe 3 compartmental osteoarthritis. 2. Large suprapatellar joint effusion, likely reactive. 3. No  acute fracture. Electronically Signed   By: Ozell Daring M.D.   On: 10/07/2024 18:56   DG Foot Complete Left Result Date: 10/07/2024 CLINICAL DATA:  Left foot pain EXAM: LEFT FOOT - COMPLETE 3  VIEW COMPARISON:  None Available. FINDINGS: There is no evidence of fracture or dislocation. Diffuse osteopenia. Diffuse joint space narrowing of the foot, most pronounced at the great toe metatarsal phalangeal joint and fourth toe distal interphalangeal joint, where there are erosive changes. Apparent juxta-articular erosion involving the lateral aspect of the great toe proximal phalangeal base. Dorsal and plantar calcaneal spur. Soft tissues are unremarkable. IMPRESSION: Degenerative changes of the foot with findings suggestive of inflammatory arthropathy such as gout. Electronically Signed   By: Limin  Xu M.D.   On: 10/07/2024 18:15    Review of Systems  HENT:  Negative for ear discharge, ear pain, hearing loss and tinnitus.   Eyes:  Negative for photophobia and pain.  Respiratory:  Negative for cough and shortness of breath.   Cardiovascular:  Negative for chest pain.  Gastrointestinal:  Negative for abdominal pain, nausea and vomiting.  Genitourinary:  Negative for dysuria, flank pain, frequency and urgency.  Musculoskeletal:  Positive for arthralgias (Left knee). Negative for back pain, myalgias and neck pain.  Neurological:  Negative for dizziness and headaches.  Hematological:  Does not bruise/bleed easily.  Psychiatric/Behavioral:  The patient is not nervous/anxious.    Blood pressure 129/66, pulse 90, temperature 97.8 F (36.6 C), temperature source Oral, resp. rate 16, height 5' 4 (1.626 m), weight 74.4 kg, SpO2 97%. Physical Exam Constitutional:      General: She is not in acute distress.    Appearance: She is well-developed. She is not diaphoretic.  HENT:     Head: Normocephalic and atraumatic.  Eyes:     General: No scleral icterus.       Right eye: No discharge.        Left eye: No discharge.     Conjunctiva/sclera: Conjunctivae normal.  Cardiovascular:     Rate and Rhythm: Normal rate and regular rhythm.  Pulmonary:     Effort: Pulmonary effort is normal. No  respiratory distress.  Musculoskeletal:     Cervical back: Normal range of motion.     Comments: LLE No traumatic wounds, ecchymosis, or rash  MIld knee TTP  Mod knee effusion  Sens DPN, SPN, TN intact  Motor EHL, ext, flex, evers 5/5  DP 2+, PT 2+, No significant edema  Skin:    General: Skin is warm and dry.  Neurological:     Mental Status: She is alert.  Psychiatric:        Mood and Affect: Mood normal.        Behavior: Behavior normal.     Assessment/Plan: Left knee OA -- Plan aspiration/injection later today. F/u with Dr. Ernie upon discharge to discuss TKR.    Ozell DOROTHA Ned, PA-C Orthopedic Surgery [phone] 10/09/2024, 11:34 AM

## 2024-10-09 NOTE — Progress Notes (Addendum)
 [facility] night cross cover note:   I was notified by the patient's RN of the patient's most recent blood sugar of 429, relative to most recent prior value of 175 around 1545 today. Reflex bmp initiated and showed no evidence of anion gap metabolic acidosis.  I subsequently ordered a total of 8 units of sq novolog  x 1 dose now as well as a repeat CBG to be checked in about 1 hour, and requested to be notified if this repeat CBG result remains greater than 400.    Eva Pore, DO Hospitalist

## 2024-10-09 NOTE — Plan of Care (Signed)

## 2024-10-09 NOTE — Plan of Care (Signed)

## 2024-10-09 NOTE — Progress Notes (Addendum)
 Progress Note  Patient Name: Peggy Chen Date of Encounter: 10/09/2024 [facility] Cardiologist: Aleene Passe, MD (Inactive)   Interval Summary   Feeling much better today-> would like to start using the bedside commode instead of bed pans  Denied lightheadedness, dizziness, chest pain, palpitations, and shortness of breath.  Left knee pain.   Vital Signs Vitals:   10/08/24 1650 10/08/24 2017 10/08/24 2344 10/09/24 0527  BP: 123/67 (!) 116/94 103/61 108/62  Pulse: 82 73 74 78  Resp: 18 18 18 19   Temp: 98.4 F (36.9 C) 98.7 F (37.1 C) 98.5 F (36.9 C) 97.9 F (36.6 C)  TempSrc: Oral Oral Oral Oral  SpO2: 98% 95% 99% 100%  Weight:      Height:        Intake/Output Summary (Last 24 hours) at 10/09/2024 0824 Last data filed at 10/09/2024 0500 Gross per 24 hour  Intake 169.02 ml  Output 800 ml  Net -630.98 ml      10/04/2024    2:29 PM 04/24/2024    6:25 PM 03/07/2024    2:24 PM  Last 3 Weights  Weight (lbs) 164 lb 170 lb 168 lb 6.4 oz  Weight (kg) 74.39 kg 77.111 kg 76.386 kg      Telemetry/ECG  Rate controlled AF Hr 60-65 - Personally Reviewed  Physical Exam  GEN: No acute distress.   Cardiac: Irregularly, irregular, no murmurs, rubs, or gallops. Distal pulses 2+ Respiratory: Clear to auscultation bilaterally. MS: No edema, no erythema or edema to right knee  Echocardiogram 10/08/24 IMPRESSIONS     1. Left ventricular ejection fraction, by estimation, is 60 to 65%. The  left ventricle has normal function. The left ventricle has no regional  wall motion abnormalities. Left ventricular diastolic parameters are  indeterminate.   2. Right ventricular systolic function is normal. The right ventricular  size is mildly enlarged.   3. Left atrial size was moderately dilated.   4. The mitral valve is normal in structure. Moderate mitral valve  regurgitation. No evidence of mitral stenosis.   5. The aortic valve is normal in structure. Aortic valve  regurgitation is  not visualized. No aortic stenosis is present.   6. The inferior vena cava is normal in size with greater than 50%  respiratory variability, suggesting right atrial pressure of 3 mmHg.   Assessment & Plan  73 year old F known to have intermittent A-fib s/p failed DCCV in 2015 and has been in NSR since then with intermittent A-fib with RVR, HTN, DM 2, and HLD who presented to the ED on 10/29 after a fall, patient was down for several days. She is currently to the hospital team for Klebsiella pneumonia, UTI, AKI, rhabdomyolysis and atrial fibrillation with RVR.   Atrial Fibrillation, with episode of RVR Failed DCCV in 2015. Has been on flecainide  since 2015 which was discontinued this admission given concern for possible arrhythmias as patient had underlying 1st AV block earlier this year.  Yesterday IV dilt was transitioned to oral dilt.  Currently in rate controlled AF Chad Vasc score 4 Patient has no history of CVA or CAD will discontinue ASA given chronic anticoagulation   Continue coumadin  [ DOAC cost prohibitive]  Consolidate cardizem 60 mg q6h to 240 mg CD  Keep K > 4 and Mag > 2 Repleted K  Hypertension BP: 108/62 PTA amlodipine  discontinued with cardizem initiation.  Cardizem as above Continue losartan 12.5 mg  Hyperlipidemia  No recent lipid panel on file, no cholesterol management  noted on chart review and patient confirms she does not use daily medication, though does use whelchol as needed for diarrhea.  Will obtain lipid panel for the am and then consider starting daily cholesterol medication  Moderate MR Continue surveillance outpatient.   Per primary PNA/UTI AKI on CKD T2DM Chronic anemia Anxiety/Depression Chronic pain Fall    For questions or updates, please contact [facility] Please consult [email or web address] for contact info under       Signed, Leontine LOISE Salen, PA-C   Patient seen and examined, note reviewed with the signed  Advanced Practice Provider. I personally reviewed laboratory data, imaging studies and relevant notes. I independently examined the patient and formulated the important aspects of the plan. I have personally discussed the plan with the patient and/or family. Comments or changes to the note/plan are indicated below.  Persistent atrial fibrillation Hypertension Hyperlipidemia Moderate mitral regurgitation  Unfortunately she did not respond to DC cardioversion.  She is rate controlled though on her current dose of Cardizem. Continue warfarin as well.  Agree with stopping flecainide . Clinically she does not appear to be volume overloaded. Lipid profile ordered today if elevated benefit from starting lipid-lowering agent.  [facility] will sign off.   Medication Recommendations:  cardizem 240 mg daily, warfarin Other recommendations (labs, testing, etc):  none  Follow up as an outpatient:  routine   Paelyn Smick DO, MS [facility] Attending Cardiologist [facility]  [address] [place], [place] [address] [phone] Website: [email or web address]

## 2024-10-09 NOTE — Progress Notes (Signed)
 PHARMACY - ANTICOAGULATION CONSULT NOTE  Pharmacy Consult for warfarin Indication: atrial fibrillation  Allergies  Allergen Reactions   Penicillins Hives        Patient Measurements: Height: 5' 4 (162.6 cm) Weight: 74.4 kg (164 lb) IBW/kg (Calculated) : 54.7 HEPARIN DW (KG): 70.2  Vital Signs: Temp: 97.9 F (36.6 C) (11/03 0527) Temp Source: Oral (11/03 0527) BP: 108/62 (11/03 0527) Pulse Rate: 78 (11/03 0527)  Labs: Recent Labs    10/07/24 0534 10/08/24 0854 10/09/24 0530  LABPROT 26.4* 26.2* 26.9*  INR 2.3* 2.3* 2.4*  CREATININE 1.37*  --  1.23*    Estimated Creatinine Clearance: 39.7 mL/min (A) (by C-G formula based on SCr of 1.23 mg/dL (H)).   Medical History: Past Medical History:  Diagnosis Date   Anxiety    Arthritis    Asthma    Bronchitis   Atherosclerosis 11/25/2017   noted on CT   Back pain    Depression    Diabetes mellitus    Diverticulosis 11/25/2017   mild, noted on CT   Fatty liver 11/25/2017   noted on CT   First degree AV block    History of kidney stones    Hydronephrosis 11/25/2017   mild left noted on CT   Hyperlipidemia    Hypertension    PAF (paroxysmal atrial fibrillation) (HCC)    Renal cyst 11/2017   Left, noted on US    Right renal artery stenosis    Sinus bradycardia    Skin cancer    questionable skin cancer on face years ago   UTI (urinary tract infection)    Medications:  Scheduled:   allopurinol   50 mg Oral Daily   aspirin  EC  81 mg Oral Daily   cefadroxil  500 mg Oral BID   diltiazem  60 mg Oral QID   DULoxetine   60 mg Oral Daily   glipiZIDE  2.5 mg Oral QAC breakfast   Influenza vac split trivalent PF  0.5 mL Intramuscular Tomorrow-1000   insulin  aspart  0-5 Units Subcutaneous QHS   insulin  aspart  0-9 Units Subcutaneous TID WC   losartan  12.5 mg Oral Daily   pneumococcal 20-valent conjugate vaccine  0.5 mL Intramuscular Tomorrow-1000   Warfarin - Pharmacist Dosing Inpatient   Does not apply q1600    zolpidem   5 mg Oral QHS   Infusions:    Assessment: Patient is a 62 YOF presenting to the ED in setting of a fall on 10/27 and unable to get up since. Patient with history of atrial fibrillation anticoagulated PTA with warfarin. Pharmacy has been consulted to dose warfarin for afib. Patient's home regimen is warfarin 5mg  tablets; take 1 tablet Mondays and Wednesdays and take 1/2 tablet all other days as confirmed by patient.   INR remains therapeutic today at 2.4.  Goal of Therapy:  INR 2-3 Monitor platelets by anticoagulation protocol: Yes   Plan:  -Warfarin 5mg  PO x1 tonight -Daily INR  Ozell Jamaica, PharmD, BCPS, [facility] Clinical Pharmacist [phone] Please check AMION for all [facility] numbers 10/09/2024

## 2024-10-10 DIAGNOSIS — T796XXA Traumatic ischemia of muscle, initial encounter: Secondary | ICD-10-CM | POA: Diagnosis not present

## 2024-10-10 LAB — BASIC METABOLIC PANEL WITH GFR
Anion gap: 11 (ref 5–15)
BUN: 35 mg/dL — ABNORMAL HIGH (ref 8–23)
CO2: 23 mmol/L (ref 22–32)
Calcium: 8.9 mg/dL (ref 8.9–10.3)
Chloride: 101 mmol/L (ref 98–111)
Creatinine, Ser: 1.32 mg/dL — ABNORMAL HIGH (ref 0.44–1.00)
GFR, Estimated: 42 mL/min — ABNORMAL LOW (ref >60)
Glucose, Bld: 183 mg/dL — ABNORMAL HIGH (ref 70–99)
Potassium: 5 mmol/L (ref 3.5–5.1)
Sodium: 135 mmol/L (ref 135–145)

## 2024-10-10 LAB — LIPID PANEL
Cholesterol: 117 mg/dL (ref 0–200)
HDL: 37 mg/dL — ABNORMAL LOW (ref >40)
LDL Cholesterol: 70 mg/dL (ref 0–99)
Total CHOL/HDL Ratio: 3.2 ratio
Triglycerides: 50 mg/dL (ref <150)
VLDL: 10 mg/dL (ref 0–40)

## 2024-10-10 LAB — GLUCOSE, CAPILLARY: Glucose-Capillary: 197 mg/dL — ABNORMAL HIGH (ref 70–99)

## 2024-10-10 LAB — PROTIME-INR
INR: 2 — ABNORMAL HIGH (ref 0.8–1.2)
Prothrombin Time: 23.8 s — ABNORMAL HIGH (ref 11.4–15.2)

## 2024-10-10 MED ORDER — HYDROCODONE-ACETAMINOPHEN 10-325 MG PO TABS: 1.0000 | ORAL_TABLET | Freq: Four times a day (QID) | ORAL | 0 refills | Status: AC | PRN

## 2024-10-10 MED ORDER — DILTIAZEM HCL ER COATED BEADS 240 MG PO CP24: 240.0000 mg | ORAL_CAPSULE | Freq: Every day | ORAL | Status: AC

## 2024-10-10 MED ORDER — POLYETHYLENE GLYCOL 3350 17 G PO PACK: 17.0000 g | PACK | Freq: Every day | ORAL | Status: AC | PRN

## 2024-10-10 MED ORDER — INSULIN ASPART 100 UNIT/ML IJ SOLN
0.0000 [IU] | Freq: Three times a day (TID) | INTRAMUSCULAR | Status: AC
Start: 2024-10-10 — End: ?

## 2024-10-10 MED ORDER — ZOLPIDEM TARTRATE 10 MG PO TABS: 10.0000 mg | ORAL_TABLET | Freq: Every day | ORAL | 0 refills | Status: AC

## 2024-10-10 MED ORDER — GLIPIZIDE 2.5 MG PO TABS: 2.5000 mg | ORAL_TABLET | Freq: Two times a day (BID) | ORAL | Status: AC

## 2024-10-10 MED ORDER — GLIPIZIDE 5 MG PO TABS
2.5000 mg | ORAL_TABLET | Freq: Two times a day (BID) | ORAL | Status: DC
  Filled 2024-10-10: qty 0.5

## 2024-10-10 MED ORDER — LOSARTAN POTASSIUM 25 MG PO TABS: 12.5000 mg | ORAL_TABLET | Freq: Every day | ORAL | Status: AC

## 2024-10-10 MED ORDER — ALLOPURINOL 100 MG PO TABS: 50.0000 mg | ORAL_TABLET | Freq: Every day | ORAL | Status: AC

## 2024-10-10 MED ORDER — INSULIN ASPART 100 UNIT/ML IJ SOLN: 0.0000 [IU] | Freq: Every day | INTRAMUSCULAR | Status: AC

## 2024-10-10 MED ORDER — BISACODYL 5 MG PO TBEC: 5.0000 mg | DELAYED_RELEASE_TABLET | Freq: Every day | ORAL | Status: AC | PRN

## 2024-10-10 MED ORDER — WARFARIN SODIUM 2.5 MG PO TABS: 2.5000 mg | ORAL_TABLET | Freq: Once | ORAL | Status: DC

## 2024-10-10 NOTE — Progress Notes (Signed)
 PHARMACY - ANTICOAGULATION CONSULT NOTE  Pharmacy Consult for warfarin Indication: atrial fibrillation  Allergies  Allergen Reactions   Penicillins Hives        Patient Measurements: Height: 5' 4 (162.6 cm) Weight: 74.4 kg (164 lb) IBW/kg (Calculated) : 54.7 HEPARIN DW (KG): 70.2  Vital Signs: Temp: 98.1 F (36.7 C) (11/04 0100) Temp Source: Oral (11/04 0100) BP: 123/63 (11/04 0100) Pulse Rate: 77 (11/04 0100)  Labs: Recent Labs    10/08/24 0854 10/09/24 0530 10/09/24 2247 10/10/24 0432  LABPROT 26.2* 26.9*  --  23.8*  INR 2.3* 2.4*  --  2.0*  CREATININE  --  1.23* 1.51* 1.32*    Estimated Creatinine Clearance: 37 mL/min (A) (by C-G formula based on SCr of 1.32 mg/dL (H)).   Medical History: Past Medical History:  Diagnosis Date   Anxiety    Arthritis    Asthma    Bronchitis   Atherosclerosis 11/25/2017   noted on CT   Back pain    Depression    Diabetes mellitus    Diverticulosis 11/25/2017   mild, noted on CT   Fatty liver 11/25/2017   noted on CT   First degree AV block    History of kidney stones    Hydronephrosis 11/25/2017   mild left noted on CT   Hyperlipidemia    Hypertension    PAF (paroxysmal atrial fibrillation) (HCC)    Renal cyst 11/2017   Left, noted on US    Right renal artery stenosis    Sinus bradycardia    Skin cancer    questionable skin cancer on face years ago   UTI (urinary tract infection)    Medications:  Scheduled:   allopurinol   50 mg Oral Daily   diltiazem  240 mg Oral Daily   DULoxetine   60 mg Oral Daily   glipiZIDE  2.5 mg Oral QAC breakfast   Influenza vac split trivalent PF  0.5 mL Intramuscular Tomorrow-1000   insulin  aspart  0-5 Units Subcutaneous QHS   insulin  aspart  0-9 Units Subcutaneous TID WC   losartan  12.5 mg Oral Daily   pneumococcal 20-valent conjugate vaccine  0.5 mL Intramuscular Tomorrow-1000   Warfarin - Pharmacist Dosing Inpatient   Does not apply q1600   zolpidem   5 mg Oral QHS    Infusions:    Assessment: Patient is a 56 YOF presenting to the ED in setting of a fall on 10/27 and unable to get up since. Patient with history of atrial fibrillation anticoagulated PTA with warfarin. Pharmacy has been consulted to dose warfarin for afib. Patient's home regimen is warfarin 5mg  tablets; take 1 tablet Mondays and Wednesdays and take 1/2 tablet all other days as confirmed by patient.   INR remains therapeutic today at 2.0.  Goal of Therapy:  INR 2-3 Monitor platelets by anticoagulation protocol: Yes   Plan:  -Warfarin 2.5mg  PO x1 tonight -Daily INR  Ozell Jamaica, PharmD, BCPS, [facility] Clinical Pharmacist [phone] Please check AMION for all [facility] Pharmacy numbers 10/10/2024

## 2024-10-10 NOTE — TOC Progression Note (Signed)
 Transition of Care [facility]) - Progression Note    Patient Details  Name: Peggy Chen MRN: [id number] Date of Birth: 04-06-1951  [facility] CM/SW Contact  Isaiah Public, [place] Phone Number: 10/10/2024, 10:17 AM  Clinical Narrative:     Jon CMA informed CSW that patients insurance authorization has been approved approved 11/4 - 11/6 Plan Auth ID# [id number] . Soy with [name] confirmed facility can accept patient today if medically ready. CSW informed MD. CSW will continue to follow and assist with patients dc planning needs.  Expected Discharge Plan: Skilled Nursing Facility Barriers to Discharge: Continued Medical Work up, English As A [employer] Language Teacher, SNF Pending bed offer               Expected Discharge Plan and Services       Living arrangements for the past 2 months: Skilled Nursing Facility                                       Social Drivers of Health (SDOH) Interventions SDOH Screenings   Food Insecurity: No Food Insecurity (10/05/2024)  Housing: Low Risk  (10/05/2024)  Transportation Needs: No Transportation Needs (10/05/2024)  Utilities: Not At Risk (10/05/2024)  Social Connections: Moderately Integrated (10/05/2024)  Tobacco Use: Medium Risk (10/04/2024)    Readmission Risk Interventions     No data to display

## 2024-10-10 NOTE — Plan of Care (Signed)

## 2024-10-10 NOTE — Discharge Summary (Signed)
 Physician Discharge Summary  Peggy Chen FMW:[id number] DOB: 21-Nov-1951 DOA: 10/04/2024  PCP: Avva, Ravisankar, MD  Admit date: 10/04/2024 Discharge date: 10/10/2024  Admitted from: Home Discharge disposition: SNF  Recommendations at discharge:  Flecainide  has been stopped.  Replaced by Cardizem CD 240 mg daily Continue Coumadin  as before You have been started on glipizide 2.5 mg twice daily.  Continue to monitor blood sugar at the rehab and use sliding scale insulin . You have been started on losartan 12.5 mg daily for renal protection from diabetes. Exercise caution in the use of pain medicines.    Subjective: Patient was seen and examined this morning.  Lying on bed.  Not in distress.  Feels left knee pain improvement after arthrocentesis yesterday Afebrile, hemodynamically stable with heart rate in 70s and 80s, still in A-fib. Blood sugar level wildly fluctuating.  Was over 400 last night, fasting blood sugar 197 this morning Labs from this morning with creatinine better at 1.32  Brief narrative: Peggy Chen is a 73 y.o. female with PMH significant for DM2, HTN, HLD, paroxysmal A-fib on Coumadin , sinus bradycardia, first-degree AV block, arthritis, impaired mobility, diverticulosis, anxiety, depression Lives at home.  Has difficulty with ambulation.  At baseline, uses a walker.  Patient had a fall on the evening of Monday 10/27. It sounds like her walker hit something while she was going to the bathroom.  She fell to the ground, did not hit her head, did not pass out, was too weak to get up on her own.  She spent most of the time crawling around the floor.  Not having heard from her for 2 days, a friend of her called welfare check.  EMS found on the floor, complaining of left hip pain and brought to the ED.  Workup in ED did not show any fracture, showed AKI, rhabdomyolysis, UTI Was also in A-fib with RVR and started on Cardizem drip Admitted to [facility]  Hospital  course: Klebsiella pneumoniae UTI Likely cause of weakness and fall  Urinalysis showed cloudy yellow urine with moderate hemoglobin, moderate leukocytes, positive nitrite and many bacteria Urine culture grew more than 100,000 CFU per mL of pansensitive Klebsiella pneumoniae Initially treated with IV Rocephin . 11/2, switched cefadroxil oral.  Completed 7-day course of antibiotics. Recent Labs  Lab 10/04/24 1436 10/05/24 1040 10/06/24 0405  WBC 17.2* 16.5* 11.9*   AKI on CKD 3B Rhabdomyolysis Was on the floor for 2 days, unable to get up Looked dehydrated on presentation. Initial CK was elevated to over 400.   Baseline creatinine less than 1.4.  Presented with creatinine elevated to 1.69 With IV hydration, CK and creatinine improved. Recent Labs    11/02/23 0255 12/15/23 2030 04/24/24 1900 10/04/24 1436 10/05/24 0549 10/06/24 0405 10/07/24 0534 10/09/24 0530 10/09/24 2247 10/10/24 0432  BUN 29* 21 33* 38* 37* 25* 24* 24* 35* 35*  CREATININE 1.52* 1.44* 1.36* 1.69* 1.43* 1.32* 1.37* 1.23* 1.51* 1.32*  CO2 25 25 22  21* 22 22 24 24 23 23    A-fib with RVR  paroxysmal A-fib H/o first-degree AV block PTA meds-Flecainide  75 mg twice daily but missed it for 2 days at least after the fall. This hospitalization, patient has been consistently in A-fib and intermittently in RVR.   Has been off and on Cardizem drip. 11/2, cardiology was consulted.  Blood thinner was stopped.  Switched from Cardizem drip to Cardizem CD 240 mg oral daily.  Still remains in A-fib but heart rate has been controlled in 70s and  80s since then. Regarding anticoagulation, patient has been chronically anticoagulated with Coumadin .   INR therapeutic between 2 and 3.  Pharmacy following. Patient states she was on Eliquis in the past but had to switch to Coumadin  few years ago because of the cost.  Her Eliquis co-pay is still high at $171.  Hence patient is not yet willing to switch to Eliquis. Recent Labs  Lab  10/06/24 0815 10/07/24 0534 10/08/24 0854 10/09/24 0530 10/10/24 0432  INR 2.5* 2.3* 2.3* 2.4* 2.0*    Prolonged QTc Initial EKG showed QTc prolonged at 562 ms Repeat EKG 10/30 showed QTc improved to 439 ms Monitor electrolytes, target K>4, Mg>2  Hypokalemia/hypomagnesemia Potassium level improved with replacement.  Not on scheduled replacement Recent Labs  Lab 10/05/24 0549 10/06/24 0405 10/07/24 0534 10/08/24 1220 10/09/24 0530 10/09/24 2247 10/10/24 0432  K 3.3* 3.3* 3.6  --  3.3* 4.6 5.0  MG 1.4*  --   --  1.5* 2.2  --   --   PHOS 3.1  --   --   --   --   --   --    Type 2 diabetes mellitus Acute hyperglycemia A1c 6.1 on 09/25/2024  PTA meds-none. Her blood sugar level has remained elevated in the hospital, worse yesterday after steroid injection into the knee was given.  Last night was over 400.  1 unit of insulin  aspart 8 units was given.   I have increased glipizide 2.5 mg daily to 2.5 g twice daily Continue SSI/Accu-Cheks Continue to monitor at the rehab Recent Labs  Lab 10/09/24 1136 10/09/24 1548 10/09/24 2146 10/09/24 2301 10/10/24 0829  GLUCAP 180* 175* 429* 356* 197*   HTN Blood pressure is in normal range losartan 12.5 mg daily  HLD Continue Whelchol  Diverticulosis Chronic anemia H&H in normal range. Recent Labs    12/15/23 2030 04/24/24 1900 10/04/24 1436 10/05/24 1040 10/06/24 0405  HGB 13.7 13.3 15.4* 12.9 12.3  MCV 88.9 92.0 91.1 91.5 91.3   Anxiety/depression Continue Cymbalta , nightly Ambien   Left knee pain  Left foot pain Chronic arthritic pain Patient has chronic arthritic pain with mobility impairment, uses walker at home and was on Norco as needed.   11/1, left foot x-ray showed degenerative changes as well as findings suggestive of gout. Given colchicine 1.2 mg followed by 0.6 mg.  Also started on allopurinol  daily.  Left foot feels much better. 11/1, left knee x-ray showed severe tricompartmental osteoarthritis with  large suprapatellar joint effusion likely reactive.  On exam, left knee is warm, swollen and tender. Orthopedics was consulted.   11/3, orthopedics aspirated left knee and also injected Depo-Medrol /Marcaine with significant pain relief.  Mobility has improved since then  Fall Impaired mobility It seems the fall was mechanical in nature.  Likely confounded by weakness from UTI. Imagings negative for fracture PT eval obtained.  SNF recommended  Goals of care:   Code Status: Full Code.  Confirmed with patient  Diet:  Diet Order             Diet - low sodium heart healthy           Diet Carb Modified           Diet regular Room service appropriate? Yes; Fluid consistency: Thin  Diet effective now                   Nutritional status:  Body mass index is 28.15 kg/m.       Wounds:  -  Discharge Medications:   Allergies as of 10/10/2024       Reactions   Penicillins Hives           Medication List     STOP taking these medications    flecainide  150 MG tablet Commonly known as: TAMBOCOR        TAKE these medications    allopurinol  100 MG tablet Commonly known as: ZYLOPRIM  Take 0.5 tablets (50 mg total) by mouth daily. Start taking on: October 11, 2024   aspirin  81 MG tablet Take 81 mg by mouth every 6 (six) hours as needed for pain.   bisacodyl 5 MG EC tablet Commonly known as: DULCOLAX Take 1 tablet (5 mg total) by mouth daily as needed for moderate constipation.   colesevelam 625 MG tablet Commonly known as: WELCHOL Take 1-3 tablets by mouth as needed (for colon).   diltiazem 240 MG 24 hr capsule Commonly known as: CARDIZEM CD Take 1 capsule (240 mg total) by mouth daily. Start taking on: October 11, 2024   DULoxetine  60 MG capsule Commonly known as: CYMBALTA  Take 60 mg by mouth daily.   glipiZIDE 2.5 MG Tabs Take 2.5 mg by mouth 2 (two) times daily before a meal.   HYDROcodone -acetaminophen  10-325 MG tablet Commonly known as:  NORCO Take 1 tablet by mouth every 6 (six) hours as needed for moderate pain (pain score 4-6). What changed:  when to take this reasons to take this   insulin  aspart 100 UNIT/ML injection Commonly known as: novoLOG  Inject 0-5 Units into the skin at bedtime.   insulin  aspart 100 UNIT/ML injection Commonly known as: novoLOG  Inject 0-9 Units into the skin 3 (three) times daily with meals.   losartan 25 MG tablet Commonly known as: COZAAR Take 0.5 tablets (12.5 mg total) by mouth daily. Start taking on: October 11, 2024   polyethylene glycol 17 g packet Commonly known as: MIRALAX / GLYCOLAX Take 17 g by mouth daily as needed for mild constipation.   warfarin 5 MG tablet Commonly known as: COUMADIN  Take 0.5 tablets (2.5 mg total) by mouth daily at 4 PM. Take 1/2 tablet (2.5 mg total) by mouth daily at 4 PM or as directed by office What changed:  how much to take additional instructions   zolpidem  10 MG tablet Commonly known as: AMBIEN  Take 1 tablet (10 mg total) by mouth at bedtime.         Follow ups:    Contact information for follow-up providers     Avva, Ravisankar, MD Follow up.   Specialty: Internal Medicine Contact information: [address] [place] [address] [phone]              Contact information for after-discharge care     Destination     [name]   Service: Skilled Nursing Contact information: [name] [place]  [address] [phone]                     Discharge Instructions:   Discharge Instructions     Call MD for:  difficulty breathing, headache or visual disturbances   Complete by: As directed    Call MD for:  extreme fatigue   Complete by: As directed    Call MD for:  hives   Complete by: As directed    Call MD for:  persistant dizziness or light-headedness   Complete by: As directed    Call MD for:  persistant nausea and vomiting  Complete by: As directed    Call MD  for:  severe uncontrolled pain   Complete by: As directed    Call MD for:  temperature >100.4   Complete by: As directed    Diet - low sodium heart healthy   Complete by: As directed    Diet Carb Modified   Complete by: As directed    Discharge instructions   Complete by: As directed    Recommendations at discharge:   Flecainide  has been stopped.  Replaced by Cardizem CD 240 mg daily  Continue Coumadin  as before  You have been started on glipizide 2.5 mg twice daily.  Continue to monitor blood sugar at the rehab and use sliding scale insulin .  You have been started on losartan 12.5 mg daily for renal protection from diabetes.  Exercise caution in the use of pain medicines.   You have been started on a blood thinner.  Please watch out for any obvious bleeding, black stool or easy bruisability.  Please contact your primary care provider for further recommendation.    Discharge instructions for diabetes mellitus: Check blood sugar 3 times a day and bedtime at home. If blood sugar running above 200 or less than 70 please call your MD to adjust insulin . If you notice signs and symptoms of hypoglycemia (low blood sugar) like jitteriness, confusion, thirst, tremor and sweating, please check blood sugar, drink sugary drink/biscuits/sweets to increase sugar level and call MD or return to ER.      PDMP reviewed this encounter.   Opioid taper instructions: It is important to wean off of your opioid medication as soon as possible. If you do not need pain medication after your surgery it is ok to stop day one. Opioids include: Codeine, Hydrocodone (Norco, Vicodin), Oxycodone (Percocet, oxycontin ) and hydromorphone amongst others.  Long term and even short term use of opiods can cause: Increased pain response Dependence Constipation Depression Respiratory depression And more.  Withdrawal symptoms can include Flu like symptoms Nausea, vomiting And more Techniques to manage these  symptoms Hydrate well Eat regular healthy meals Stay active Use relaxation techniques(deep breathing, meditating, yoga) Do Not substitute Alcohol to help with tapering If you have been on opioids for less than two weeks and do not have pain than it is ok to stop all together.  Plan to wean off of opioids This plan should start within one week post op of your joint replacement. Maintain the same interval or time between taking each dose and first decrease the dose.  Cut the total daily intake of opioids by one tablet each day Next start to increase the time between doses. The last dose that should be eliminated is the evening dose.        General discharge instructions: Follow with Primary MD Janey Santos, MD in 7 days  Please request your PCP  to go over your hospital tests, procedures, radiology results at the follow up. Please get your medicines reviewed and adjusted.  Your PCP may decide to repeat certain labs or tests as needed. Do not drive, operate heavy machinery, perform activities at heights, swimming or participation in water activities or provide baby sitting services if your were admitted for syncope or siezures until you have seen by Primary MD or a Neurologist and advised to do so again. [place]  Controlled Substance Reporting System database was reviewed. Do not drive, operate heavy machinery, perform activities at heights, swim, participate in water activities or provide baby-sitting services while on medications for pain, sleep  and mood until your outpatient physician has reevaluated you and advised to do so again.  You are strongly recommended to comply with the dose, frequency and duration of prescribed medications. Activity: As tolerated with Full fall precautions use walker/cane & assistance as needed Avoid using any recreational substances like cigarette, tobacco, alcohol, or non-prescribed drug. If you experience worsening of your admission symptoms, develop  shortness of breath, life threatening emergency, suicidal or homicidal thoughts you must seek medical attention immediately by calling 911 or calling your MD immediately  if symptoms less severe. You must read complete instructions/literature along with all the possible adverse reactions/side effects for all the medicines you take and that have been prescribed to you. Take any new medicine only after you have completely understood and accepted all the possible adverse reactions/side effects.  Wear Seat belts while driving. You were cared for by a hospitalist during your hospital stay. If you have any questions about your discharge medications or the care you received while you were in the hospital after you are discharged, you can call the unit and ask to speak with the hospitalist or the covering physician. Once you are discharged, your primary care physician will handle any further medical issues. Please note that NO REFILLS for any discharge medications will be authorized once you are discharged, as it is imperative that you return to your primary care physician (or establish a relationship with a primary care physician if you do not have one).   Increase activity slowly   Complete by: As directed        Discharge Exam:   Vitals:   10/09/24 1247 10/09/24 2005 10/10/24 0100 10/10/24 0837  BP: 112/71 132/71 123/63   Pulse: 90 82 77   Resp:  17 18 18   Temp:  98.6 F (37 C) 98.1 F (36.7 C) 98 F (36.7 C)  TempSrc:  Oral Oral Oral  SpO2: 97% 99% 96%   Weight:      Height:        Body mass index is 28.15 kg/m.  General exam: Pleasant, elderly Caucasian female.  Not in distress. Skin: No rashes, lesions or ulcers. HEENT: Atraumatic, normocephalic, no obvious bleeding Lungs: Clear to auscultation bilaterally CVS: Rate controlled irregular rhythm, no murmur GI/Abd: Soft, nontender, nondistended, bowel sound present,   CNS: Alert, awake, oriented x 3. Psychiatry: Mood  appropriate. Extremities: No pedal edema, no calf tenderness, left knee less swollen, warm and tender today   The results of significant diagnostics from this hospitalization (including imaging, microbiology, ancillary and laboratory) are listed below for reference.    Procedures and Diagnostic Studies:   CT Cervical Spine Wo Contrast Result Date: 10/04/2024 CLINICAL DATA:  Status post fall. EXAM: CT CERVICAL SPINE WITHOUT CONTRAST TECHNIQUE: Multidetector CT imaging of the cervical spine was performed without intravenous contrast. Multiplanar CT image reconstructions were also generated. RADIATION DOSE REDUCTION: This exam was performed according to the departmental dose-optimization program which includes automated exposure control, adjustment of the mA and/or kV according to patient size and/or use of iterative reconstruction technique. COMPARISON:  Apr 24, 2024 FINDINGS: Alignment: There is straightening of the normal cervical spine lordosis. Stable, chronic approximately 1 mm to 2 mm retrolisthesis of the C4 vertebral body is noted on C5 with 1 mm to 2 mm anterolisthesis of C5 on C6. Skull base and vertebrae: No acute fracture. No primary bone lesion or focal pathologic process. Soft tissues and spinal canal: No prevertebral fluid or swelling. No visible canal hematoma. Disc  levels: There is mild anterior osteophyte formation at the level of C3-C4 and C5-C6, with mild to moderate severity endplate sclerosis, anterior osteophyte formation and posterior bony spurring at the levels of C4-C5 and C6-C7. There is moderate to marked severity intervertebral disc space narrowing at C4-C5 with marked severity intervertebral disc space narrowing at C6-C7. Bilateral moderate severity multilevel facet joint hypertrophy is noted. Upper chest: Negative. Other: None. IMPRESSION: 1. No acute cervical spine fracture. 2. Multilevel degenerative changes, most prominent at the levels of C4-C5 and C6-C7. Electronically  Signed   By: Suzen Dials M.D.   On: 10/04/2024 15:38   CT Head Wo Contrast Result Date: 10/04/2024 CLINICAL DATA:  Status post fall. EXAM: CT HEAD WITHOUT CONTRAST TECHNIQUE: Contiguous axial images were obtained from the base of the skull through the vertex without intravenous contrast. RADIATION DOSE REDUCTION: This exam was performed according to the departmental dose-optimization program which includes automated exposure control, adjustment of the mA and/or kV according to patient size and/or use of iterative reconstruction technique. COMPARISON:  Apr 24, 2024 FINDINGS: Brain: There is generalized cerebral atrophy with widening of the extra-axial spaces and ventricular dilatation. There are areas of decreased attenuation within the white matter tracts of the supratentorial brain, consistent with microvascular disease changes. Vascular: No hyperdense vessel or unexpected calcification. Skull: Normal. Negative for fracture or focal lesion. Sinuses/Orbits: No acute finding. Other: None. IMPRESSION: 1. No acute intracranial abnormality. 2. Generalized cerebral atrophy and microvascular disease changes of the supratentorial brain. Electronically Signed   By: Suzen Dials M.D.   On: 10/04/2024 15:33   DG Chest Portable 1 View Result Date: 10/04/2024 CLINICAL DATA:  Status post fall. EXAM: PORTABLE CHEST 1 VIEW COMPARISON:  December 15, 2023 FINDINGS: The heart size and mediastinal contours are within normal limits. Low lung volumes are noted. Both lungs are clear. Radiopaque surgical clips are seen overlying the right upper quadrant. Multilevel degenerative changes are present throughout the thoracic spine. IMPRESSION: Low lung volumes without active cardiopulmonary disease. Electronically Signed   By: Suzen Dials M.D.   On: 10/04/2024 15:31   DG Pelvis Portable Result Date: 10/04/2024 CLINICAL DATA:  Fall. EXAM: DG PORTABLE PELVIS COMPARISON:  12/15/2023. FINDINGS: Pelvis is intact with  normal and symmetric sacroiliac joints. No acute fracture or dislocation. No aggressive osseous lesion. Visualized sacral arcuate lines are unremarkable. Unremarkable symphysis pubis. There are mild degenerative changes of bilateral hip joints without significant joint space narrowing. Osteophytosis of the superior acetabulum. No radiopaque foreign bodies. IMPRESSION: No acute osseous abnormality of the pelvis. Electronically Signed   By: Ree Molt M.D.   On: 10/04/2024 15:16     Labs:   Basic Metabolic Panel: Recent Labs  Lab 10/05/24 0549 10/06/24 0405 10/07/24 0534 10/08/24 1220 10/09/24 0530 10/09/24 2247 10/10/24 0432  NA 139 134* 135  --  137 131* 135  K 3.3* 3.3* 3.6  --  3.3* 4.6 5.0  CL 101 100 100  --  101 97* 101  CO2 22 22 24   --  24 23 23   GLUCOSE 117* 164* 164*  --  91 397* 183*  BUN 37* 25* 24*  --  24* 35* 35*  CREATININE 1.43* 1.32* 1.37*  --  1.23* 1.51* 1.32*  CALCIUM 9.0 8.1* 8.3*  --  8.5* 8.2* 8.9  MG 1.4*  --   --  1.5* 2.2  --   --   PHOS 3.1  --   --   --   --   --   --  GFR Estimated Creatinine Clearance: 37 mL/min (A) (by C-G formula based on SCr of 1.32 mg/dL (H)). Liver Function Tests: Recent Labs  Lab 10/04/24 1436  AST 31  ALT 17  ALKPHOS 65  BILITOT 1.4*  PROT 7.5  ALBUMIN 3.6   No results for input(s): LIPASE, AMYLASE in the last 168 hours. No results for input(s): AMMONIA in the last 168 hours. Coagulation profile Recent Labs  Lab 10/06/24 0815 10/07/24 0534 10/08/24 0854 10/09/24 0530 10/10/24 0432  INR 2.5* 2.3* 2.3* 2.4* 2.0*    CBC: Recent Labs  Lab 10/04/24 1436 10/05/24 1040 10/06/24 0405  WBC 17.2* 16.5* 11.9*  NEUTROABS  --   --  9.4*  HGB 15.4* 12.9 12.3  HCT 47.3* 39.6 36.9  MCV 91.1 91.5 91.3  PLT 310 292 263   Cardiac Enzymes: Recent Labs  Lab 10/04/24 1436 10/05/24 0549 10/06/24 0405  CKTOTAL 410* 274* 105   BNP: Invalid input(s): POCBNP CBG: Recent Labs  Lab 10/09/24 1136  10/09/24 1548 10/09/24 2146 10/09/24 2301 10/10/24 0829  GLUCAP 180* 175* 429* 356* 197*   D-Dimer No results for input(s): DDIMER in the last 72 hours. Hgb A1c No results for input(s): HGBA1C in the last 72 hours. Lipid Profile Recent Labs    10/10/24 0432  CHOL 117  HDL 37*  LDLCALC 70  TRIG 50  CHOLHDL 3.2   Thyroid  function studies No results for input(s): TSH, T4TOTAL, T3FREE, THYROIDAB in the last 72 hours.  Invalid input(s): FREET3 Anemia work up No results for input(s): VITAMINB12, FOLATE, FERRITIN, TIBC, IRON, RETICCTPCT in the last 72 hours. Microbiology Recent Results (from the past 240 hours)  Urine Culture     Status: Abnormal   Collection Time: 10/04/24  9:07 PM   Specimen: Urine, Clean Catch  Result Value Ref Range Status   Specimen Description URINE, CLEAN CATCH  Final   Special Requests   Final    NONE Performed at [facility], [address], [place], [place] [address]    Culture >=100,000 COLONIES/mL KLEBSIELLA PNEUMONIAE (A)  Final   Report Status 10/07/2024 FINAL  Final   Organism ID, Bacteria KLEBSIELLA PNEUMONIAE (A)  Final      Susceptibility   Klebsiella pneumoniae - MIC*    AMPICILLIN RESISTANT Resistant     CEFAZOLIN (URINE) Value in next row Sensitive      2 SENSITIVEThis is a modified FDA-approved test that has been validated and its performance characteristics determined by the reporting laboratory.  This laboratory is certified under the Clinical Laboratory Improvement Amendments CLIA as qualified to perform high complexity clinical laboratory testing.    CEFEPIME Value in next row Sensitive      2 SENSITIVEThis is a modified FDA-approved test that has been validated and its performance characteristics determined by the reporting laboratory.  This laboratory is certified under the Clinical Laboratory Improvement Amendments CLIA as qualified to perform high complexity clinical laboratory testing.     ERTAPENEM Value in next row Sensitive      2 SENSITIVEThis is a modified FDA-approved test that has been validated and its performance characteristics determined by the reporting laboratory.  This laboratory is certified under the Clinical Laboratory Improvement Amendments CLIA as qualified to perform high complexity clinical laboratory testing.    CEFTRIAXONE  Value in next row Sensitive      2 SENSITIVEThis is a modified FDA-approved test that has been validated and its performance characteristics determined by the reporting laboratory.  This laboratory is certified under  the Clinical Laboratory Improvement Amendments CLIA as qualified to perform high complexity clinical laboratory testing.    CIPROFLOXACIN Value in next row Sensitive      2 SENSITIVEThis is a modified FDA-approved test that has been validated and its performance characteristics determined by the reporting laboratory.  This laboratory is certified under the Clinical Laboratory Improvement Amendments CLIA as qualified to perform high complexity clinical laboratory testing.    GENTAMICIN  Value in next row Sensitive      2 SENSITIVEThis is a modified FDA-approved test that has been validated and its performance characteristics determined by the reporting laboratory.  This laboratory is certified under the Clinical Laboratory Improvement Amendments CLIA as qualified to perform high complexity clinical laboratory testing.    NITROFURANTOIN Value in next row Sensitive      2 SENSITIVEThis is a modified FDA-approved test that has been validated and its performance characteristics determined by the reporting laboratory.  This laboratory is certified under the Clinical Laboratory Improvement Amendments CLIA as qualified to perform high complexity clinical laboratory testing.    TRIMETH /SULFA  Value in next row Sensitive      2 SENSITIVEThis is a modified FDA-approved test that has been validated and its performance characteristics determined by  the reporting laboratory.  This laboratory is certified under the Clinical Laboratory Improvement Amendments CLIA as qualified to perform high complexity clinical laboratory testing.    AMPICILLIN/SULBACTAM Value in next row Sensitive      2 SENSITIVEThis is a modified FDA-approved test that has been validated and its performance characteristics determined by the reporting laboratory.  This laboratory is certified under the Clinical Laboratory Improvement Amendments CLIA as qualified to perform high complexity clinical laboratory testing.    PIP/TAZO Value in next row Sensitive      <=4 SENSITIVEThis is a modified FDA-approved test that has been validated and its performance characteristics determined by the reporting laboratory.  This laboratory is certified under the Clinical Laboratory Improvement Amendments CLIA as qualified to perform high complexity clinical laboratory testing.    MEROPENEM Value in next row Sensitive      <=4 SENSITIVEThis is a modified FDA-approved test that has been validated and its performance characteristics determined by the reporting laboratory.  This laboratory is certified under the Clinical Laboratory Improvement Amendments CLIA as qualified to perform high complexity clinical laboratory testing.    * >=100,000 COLONIES/mL KLEBSIELLA PNEUMONIAE    Time coordinating discharge: 45 minutes  Signed: Daeveon Zweber  [facility] 10/10/2024, 10:54 AM

## 2024-10-10 NOTE — Progress Notes (Signed)
 DISCHARGE NOTE SNF Peggy Chen to be discharged Rehab Peggy Chen  per MD order. Patient verbalized understanding.  Skin clean, dry and intact without evidence of skin break down, no evidence of skin tears noted. IV catheter discontinued intact. Site without signs and symptoms of complications. Dressing and pressure applied. Pt denies pain at the site currently. No complaints noted.  Patient free of lines, drains, and wounds.   Discharge packet assembled. An After Visit Summary (AVS) was printed and given to the EMS personnel. Patient escorted via stretcher and discharged to [facility] via ambulance. Report called to accepting facility; all questions and concerns addressed.   Peyton SHAUNNA Pepper, RN

## 2024-10-10 NOTE — Plan of Care (Signed)

## 2024-10-10 NOTE — Progress Notes (Signed)
 Occupational Therapy Treatment Patient Details Name: Peggy Chen MRN: [id number] DOB: Oct 31, 1951 Today's Date: 10/10/2024   History of present illness 73 y.o. female presents to [facility] 10/04/24 after falling with pt on the ground for 2 days, unable to get up. Pt with rhabdomyolysis, UTI, and AKI. PMHx: DM2, HTN, HLD, paroxysmal A-fib on Coumadin , sinus bradycardia, first-degree AV block, arthritis, impaired mobility, diverticulosis, anxiety, depression   OT comments  Pt making progress with functional goals. Pt in bed upon arrival and agreeable to OOB activity with OT. Pt sat EOB with CGA, increased time and cues to scoot hips to EOB, min A to don clean gown and max A to don socks, mod A STS, min A with SPTs to [facility]. Pt with posterior lean with initial stand, but is able to correct with verbal and physical cues and increased time. OT will continue to follow acutely to maximize level of function and safety      If plan is discharge home, recommend the following:  A lot of help with bathing/dressing/bathroom;Assistance with cooking/housework;Assistance with feeding;Direct supervision/assist for medications management;Direct supervision/assist for financial management;Assist for transportation;Help with stairs or ramp for entrance;A lot of help with walking and/or transfers   Equipment Recommendations  Other (comment) (defer to SNF)    Recommendations for Other Services      Precautions / Restrictions Precautions Precautions: Fall Recall of Precautions/Restrictions: Impaired Precaution/Restrictions Comments: watch HR Restrictions Weight Bearing Restrictions Per Provider Order: No       Mobility Bed Mobility Overal bed mobility: Needs Assistance Bed Mobility: Sit to Supine, Supine to Sit     Supine to sit: Contact guard, Used rails Sit to supine: Contact guard assist   General bed mobility comments: increased time, min verbal cues to scoot to EOB    Transfers Overall transfer  level: Needs assistance Equipment used: Rolling walker (2 wheels), 2 person hand held assist Transfers: Sit to/from Stand, Bed to chair/wheelchair/BSC Sit to Stand: Mod assist Stand pivot transfers: Min assist         General transfer comment: posterior lean, able to correct posture with verbal cues     Balance Overall balance assessment: Needs assistance Sitting-balance support: No upper extremity supported, Feet supported Sitting balance-Leahy Scale: Good   Postural control: Posterior lean Standing balance support: Bilateral upper extremity supported, During functional activity, Reliant on assistive device for balance Standing balance-Leahy Scale: Poor Standing balance comment: BUE support with pt with strong posterior lean initially, able to correct with cues and assist                           ADL either performed or assessed with clinical judgement   ADL Overall ADL's : Needs assistance/impaired     Grooming: Wash/dry hands;Wash/dry face;Oral care;Sitting;Minimal assistance;Contact guard assist           Upper Body Dressing : Contact guard assist;Sitting   Lower Body Dressing: Maximal assistance   Toilet Transfer: Moderate assistance;Cueing for safety;Rolling walker (2 wheels);Stand-pivot;BSC/3in1;Cueing for sequencing   Toileting- Clothing Manipulation and Hygiene: Maximal assistance;Sit to/from stand       Functional mobility during ADLs: Moderate assistance;Minimal assistance;Rolling walker (2 wheels);Cueing for safety;Cueing for sequencing      Extremity/Trunk Assessment Upper Extremity Assessment Upper Extremity Assessment: Generalized weakness   Lower Extremity Assessment Lower Extremity Assessment: Defer to PT evaluation        Vision Baseline Vision/History: 1 Wears glasses Ability to See in Adequate Light: 0 Adequate Patient  Visual Report: No change from baseline     Perception     Praxis     Communication  Communication Communication: No apparent difficulties   Cognition Arousal: Alert Behavior During Therapy: WFL for tasks assessed/performed                                 Following commands: Impaired Following commands impaired: Follows one step commands with increased time, Follows multi-step commands inconsistently      Cueing   Cueing Techniques: Verbal cues  Exercises      Shoulder Instructions       General Comments      Pertinent Vitals/ Pain       Pain Assessment Pain Assessment: Faces Faces Pain Scale: Hurts little more Pain Location: L knee and low back Pain Descriptors / Indicators: Discomfort, Grimacing Pain Intervention(s): Limited activity within patient's tolerance, Monitored during session, Repositioned  Home Living                                          Prior Functioning/Environment              Frequency  Min 2X/week        Progress Toward Goals  OT Goals(current goals can now be found in the care plan section)  Progress towards OT goals: Progressing toward goals     Plan      Co-evaluation                 AM-PAC OT 6 Clicks Daily Activity     Outcome Measure   Help from another person eating meals?: None Help from another person taking care of personal grooming?: A Little Help from another person toileting, which includes using toliet, bedpan, or urinal?: A Lot Help from another person bathing (including washing, rinsing, drying)?: A Lot Help from another person to put on and taking off regular upper body clothing?: A Little Help from another person to put on and taking off regular lower body clothing?: A Lot 6 Click Score: 16    End of Session Equipment Utilized During Treatment: Gait belt;Rolling walker (2 wheels);Other (comment) (BSC)  OT Visit Diagnosis: Muscle weakness (generalized) (M62.81);Other symptoms and signs involving cognitive function;Pain Pain - part of body:  (back)    Activity Tolerance Patient tolerated treatment well   Patient Left in bed;with call bell/phone within reach;with bed alarm set   Nurse Communication          Time: 8885-8857 OT Time Calculation (min): 28 min  Charges: OT General Charges $OT Visit: 1 Visit OT Treatments $Self Care/Home Management : 8-22 mins $Therapeutic Activity: 8-22 mins   Jacques Karna Loose 10/10/2024, 12:53 PM

## 2024-10-10 NOTE — TOC Transition Note (Signed)
 Transition of Care [facility]) - Discharge Note   Patient Details  Name: Peggy Chen MRN: [id number] Date of Birth: 08/31/1951  [facility] CM/SW Contact:  Isaiah Public, LCSWA Phone Number: 10/10/2024, 11:09 AM   Clinical Narrative:     [name]   Anticipated DC date: 10/10/2024  Family notified: Patient politely declined. Patient informed CSW she is going to notify her friend [name].  Transport by: [facility]  ?  Per MD patient ready for DC to [name] . RN, patient, patient's family, and facility notified of DC. Discharge Summary sent to facility. RN given number for report [phone] RM# [id number]. DC packet on chart. Ambulance transport requested for patient.  CSW signing off.   Final next level of care: Skilled Nursing Facility Barriers to Discharge: No Barriers Identified   Patient Goals and CMS Choice Patient states their goals for this hospitalization and ongoing recovery are:: SNF CMS Medicare.gov Compare Post Acute Care list provided to:: Patient Choice offered to / list presented to : Patient      Discharge Placement              [name] Patient to be transferred to facility by: [facility] Name of family member notified: Patient declined she informed CSW she is going to notify [name] her friend Patient and family notified of of transfer: 10/10/24  Discharge Plan and Services Additional resources added to the After Visit Summary for                                       Social Drivers of Health (SDOH) Interventions SDOH Screenings   Food Insecurity: No Food Insecurity (10/05/2024)  Housing: Low Risk  (10/05/2024)  Transportation Needs: No Transportation Needs (10/05/2024)  Utilities: Not At Risk (10/05/2024)  Social Connections: Moderately Integrated (10/05/2024)  Tobacco Use: Medium Risk (10/04/2024)     Readmission Risk Interventions     No data to display

## 2024-11-05 NOTE — Progress Notes (Deleted)
    OFFICE NOTE:    Date:  11/05/2024  ID:  Peggy Chen, DOB Feb 26, 1951, MRN [id number] PCP: Janey Santos, MD  [facility] Providers Cardiologist:  Aleene Passe, MD (Inactive) { Click to update primary MD,subspecialty MD or APP then REFRESH:1}       Paroxysmal atrial fibrillation hx of failed DCCV in 2015 MPI (06/05/2014): Normal AAD: Flecainide  (DC'd 10/2024 due to 1st degree AVB) Anticoag: Coumadin  (due to cost) TTE (10/08/2024): EF 60-65%, no RWMA, normal RVSF, mild RVE, moderate LAE, moderate MR  Moderate mitral regurgitation  Hypertension Hyperlipidemia Diabetes mellitus Chronic kidney disease Hepatic steatosis First degree AV block Right renal artery stenosis Renal artery US  (05/30/2020): Proximal right renal artery stenosis, no left renal artery stenosis  Anemia        Discussed the use of AI scribe software for clinical note transcription with the patient, who gave verbal consent to proceed. History of Present Illness Peggy Chen is a 73 y.o. female for post hospital follow up.   She was a former patient of Dr. Passe, last seen in April 2025.  She was admitted from October 29 to October 10, 2024, after a fall where she was unable to get up on her own and remained down for two days. During this admission, she was found to have acute kidney injury in the setting of rhabdomyolysis and a urinary tract infection. Her creatinine peaked at 1.69 mg/dL and improved to 8.67 mg/dL by discharge. During the hospital stay, she developed atrial fibrillation with a rapid ventricular rate. She was seen by Cardiology. Flecainide  was discontinued due to 1st degree AVB when in NSR. She was rate controlled with Diltiazem .     ROS-See HPI***    Studies Reviewed:       LABS Potassium: 5.0 (10/10/2024) Creatinine: 1.32 (10/10/2024) EGFR: 42 (10/10/2024) ALT: 17 (10/04/2024) Hemoglobin: 12.3 (10/06/2024) Platelet count: 736999 (10/06/2024) A1c: 6.1  (10/04/2024) Total cholesterol: 117 (10/10/2024) HDL: 37 (10/10/2024) LDL: 70 (10/10/2024) Triglycerides: 50 (10/10/2024) Results  Risk Assessment/Calculations: {Does this patient have ATRIAL FIBRILLATION?:[phone]} No BP recorded.  {Refresh Note OR Click here to enter BP  :1}***      Physical Exam:  VS:  There were no vitals taken for this visit.       Wt Readings from Last 3 Encounters:  10/04/24 164 lb (74.4 kg)  04/24/24 170 lb (77.1 kg)  03/07/24 168 lb 6.4 oz (76.4 kg)    Physical Exam***     Assessment and Plan:    Assessment & Plan PAF (paroxysmal atrial fibrillation) (HCC) Recent admission with AKI, rhabdomyolysis and AF w RVR in the setting of a fall, UTI. Flecainide  was stopped due to risk for arrhythmia with 1st degree AVB when in NSR. She was started on Diltiazem  for rate control. She remains on Coumadin  due to high cost of DOACs.  Essential hypertension  Stage 3a chronic kidney disease (HCC)  Assessment and Plan Assessment & Plan    {      :1}    {Are you ordering a CV Procedure (e.g. stress test, cath, DCCV, TEE, etc)?   Press F2        :[id number]}  Dispo:  No follow-ups on file.  Signed, Glendia Ferrier, PA-C

## 2024-11-06 ENCOUNTER — Ambulatory Visit: Attending: Physician Assistant | Admitting: Physician Assistant

## 2024-11-06 DIAGNOSIS — I10 Essential (primary) hypertension: Secondary | ICD-10-CM

## 2024-11-06 DIAGNOSIS — I480 Paroxysmal atrial fibrillation: Secondary | ICD-10-CM

## 2024-11-06 DIAGNOSIS — N1831 Chronic kidney disease, stage 3a: Secondary | ICD-10-CM

## 2024-12-20 ENCOUNTER — Telehealth: Payer: Self-pay | Admitting: Cardiology

## 2024-12-20 NOTE — Telephone Encounter (Signed)
 Spoke with pt, aware according to the discharge summary she is not to be taking flecainide . She is not sure about what medications she is actually taking because from the facility, they were in pill packs. She reports a educational psychologist came out to her home and filled her weekly pill box with what medications she is supposed to take. She does not have but 3 bottles of medications at her home. She is going to find out who the nurse was that came to her home and let us  know.

## 2024-12-20 NOTE — Telephone Encounter (Signed)
 Pt c/o medication issue:  1. Name of Medication:   flecainide  (TAMBOCOR ) 50 MG tablet   2. How are you currently taking this medication (dosage and times per day)?   3. Are you having a reaction (difficulty breathing--STAT)?   4. What is your medication issue?   Patient wants a call back to discuss if she should still be on this medication.

## 2024-12-20 NOTE — Telephone Encounter (Signed)
 Spoke with pt, she reports the nurse was eleanor and her phone number is [phone]. Tried to call and the call can not be completed at this time, will need to call tomorrow.

## 2025-03-08 ENCOUNTER — Ambulatory Visit: Admitting: Cardiology
